# Patient Record
Sex: Female | Born: 1955
Health system: Southern US, Community
[De-identification: ages and names within clinical notes are randomized; demographics above are authoritative.]

## PROBLEM LIST (undated history)

## (undated) DIAGNOSIS — M1990 Unspecified osteoarthritis, unspecified site: Secondary | ICD-10-CM

## (undated) DIAGNOSIS — J302 Other seasonal allergic rhinitis: Secondary | ICD-10-CM

## (undated) DIAGNOSIS — I10 Essential (primary) hypertension: Secondary | ICD-10-CM

## (undated) DIAGNOSIS — G510 Bell's palsy: Secondary | ICD-10-CM

## (undated) DIAGNOSIS — E785 Hyperlipidemia, unspecified: Secondary | ICD-10-CM

## (undated) DIAGNOSIS — G8929 Other chronic pain: Secondary | ICD-10-CM

## (undated) DIAGNOSIS — T7840XA Allergy, unspecified, initial encounter: Secondary | ICD-10-CM

## (undated) DIAGNOSIS — H9201 Otalgia, right ear: Secondary | ICD-10-CM

## (undated) DIAGNOSIS — Z803 Family history of malignant neoplasm of breast: Secondary | ICD-10-CM

## (undated) HISTORY — DX: Unspecified osteoarthritis, unspecified site: M19.90

## (undated) HISTORY — DX: Other chronic pain: G89.29

## (undated) HISTORY — DX: Other seasonal allergic rhinitis: J30.2

## (undated) HISTORY — DX: Hyperlipidemia, unspecified: E78.5

## (undated) HISTORY — DX: Allergy, unspecified, initial encounter: T78.40XA

## (undated) HISTORY — PX: TUBAL LIGATION: SHX77

## (undated) HISTORY — DX: Otalgia, right ear: H92.01

## (undated) HISTORY — DX: Family history of malignant neoplasm of breast: Z80.3

## (undated) HISTORY — DX: Bell's palsy: G51.0

---
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
## 1999-04-20 ENCOUNTER — Emergency Department (HOSPITAL_COMMUNITY): Admission: EM | Admit: 1999-04-20 | Discharge: 1999-04-20 | Payer: Self-pay | Admitting: Emergency Medicine

## 1999-10-06 ENCOUNTER — Emergency Department (HOSPITAL_COMMUNITY): Admission: EM | Admit: 1999-10-06 | Discharge: 1999-10-06 | Payer: Self-pay | Admitting: Emergency Medicine

## 1999-12-20 ENCOUNTER — Other Ambulatory Visit: Admission: RE | Admit: 1999-12-20 | Discharge: 1999-12-20 | Payer: Self-pay | Admitting: Obstetrics and Gynecology

## 2000-03-04 ENCOUNTER — Emergency Department (HOSPITAL_COMMUNITY): Admission: EM | Admit: 2000-03-04 | Discharge: 2000-03-04 | Payer: Self-pay | Admitting: Emergency Medicine

## 2000-12-11 ENCOUNTER — Encounter: Admission: RE | Admit: 2000-12-11 | Discharge: 2000-12-11 | Payer: Self-pay | Admitting: Family Medicine

## 2000-12-11 ENCOUNTER — Encounter: Payer: Self-pay | Admitting: Family Medicine

## 2001-08-13 ENCOUNTER — Emergency Department (HOSPITAL_COMMUNITY): Admission: EM | Admit: 2001-08-13 | Discharge: 2001-08-13 | Payer: Self-pay | Admitting: Emergency Medicine

## 2001-12-11 ENCOUNTER — Other Ambulatory Visit: Admission: RE | Admit: 2001-12-11 | Discharge: 2001-12-11 | Payer: Self-pay | Admitting: Obstetrics and Gynecology

## 2001-12-15 ENCOUNTER — Encounter: Payer: Self-pay | Admitting: Family Medicine

## 2001-12-15 ENCOUNTER — Encounter: Admission: RE | Admit: 2001-12-15 | Discharge: 2001-12-15 | Payer: Self-pay | Admitting: Family Medicine

## 2002-03-21 ENCOUNTER — Emergency Department (HOSPITAL_COMMUNITY): Admission: EM | Admit: 2002-03-21 | Discharge: 2002-03-21 | Payer: Self-pay | Admitting: Emergency Medicine

## 2002-03-21 ENCOUNTER — Encounter: Payer: Self-pay | Admitting: Emergency Medicine

## 2002-07-14 ENCOUNTER — Emergency Department (HOSPITAL_COMMUNITY): Admission: EM | Admit: 2002-07-14 | Discharge: 2002-07-14 | Payer: Self-pay | Admitting: Emergency Medicine

## 2003-01-05 ENCOUNTER — Emergency Department (HOSPITAL_COMMUNITY): Admission: EM | Admit: 2003-01-05 | Discharge: 2003-01-05 | Payer: Self-pay | Admitting: Emergency Medicine

## 2004-06-24 ENCOUNTER — Emergency Department (HOSPITAL_COMMUNITY): Admission: EM | Admit: 2004-06-24 | Discharge: 2004-06-24 | Payer: Self-pay | Admitting: Emergency Medicine

## 2006-01-16 ENCOUNTER — Emergency Department (HOSPITAL_COMMUNITY): Admission: EM | Admit: 2006-01-16 | Discharge: 2006-01-16 | Payer: Self-pay | Admitting: Emergency Medicine

## 2007-08-04 ENCOUNTER — Emergency Department (HOSPITAL_COMMUNITY): Admission: EM | Admit: 2007-08-04 | Discharge: 2007-08-04 | Payer: Self-pay | Admitting: Emergency Medicine

## 2007-08-14 ENCOUNTER — Emergency Department (HOSPITAL_COMMUNITY): Admission: EM | Admit: 2007-08-14 | Discharge: 2007-08-15 | Payer: Self-pay | Admitting: Emergency Medicine

## 2007-08-21 ENCOUNTER — Ambulatory Visit: Payer: Self-pay | Admitting: Internal Medicine

## 2007-08-21 ENCOUNTER — Observation Stay (HOSPITAL_COMMUNITY): Admission: EM | Admit: 2007-08-21 | Discharge: 2007-08-22 | Payer: Self-pay | Admitting: Emergency Medicine

## 2007-09-29 ENCOUNTER — Emergency Department (HOSPITAL_COMMUNITY): Admission: EM | Admit: 2007-09-29 | Discharge: 2007-09-29 | Payer: Self-pay | Admitting: Family Medicine

## 2007-09-30 ENCOUNTER — Ambulatory Visit: Payer: Self-pay | Admitting: *Deleted

## 2007-11-25 ENCOUNTER — Ambulatory Visit: Payer: Self-pay | Admitting: Family Medicine

## 2007-11-25 ENCOUNTER — Encounter (INDEPENDENT_AMBULATORY_CARE_PROVIDER_SITE_OTHER): Payer: Self-pay | Admitting: Nurse Practitioner

## 2007-11-25 LAB — CONVERTED CEMR LAB
ALT: <8 units/L (ref 0–35)
AST: 15 units/L (ref 0–37)
Albumin: 4.2 g/dL (ref 3.5–5.2)
Basophils Relative: 0 % (ref 0–1)
Eosinophils Absolute: 0.1 10*3/uL (ref 0.0–0.7)
HCT: 38 % (ref 36.0–46.0)
Lymphocytes Relative: 33 % (ref 12–46)
Lymphs Abs: 1.2 10*3/uL (ref 0.7–4.0)
Monocytes Relative: 9 % (ref 3–12)
Potassium: 4.5 meq/L (ref 3.5–5.3)
RBC: 3.97 M/uL (ref 3.87–5.11)
RDW: 13.2 % (ref 11.5–15.5)
Sodium: 139 meq/L (ref 135–145)
TSH: 1.981 microintl units/mL (ref 0.350–5.50)
Total Bilirubin: 0.3 mg/dL (ref 0.3–1.2)
WBC: 3.8 10*3/uL — ABNORMAL LOW (ref 4.0–10.5)

## 2008-01-06 ENCOUNTER — Emergency Department (HOSPITAL_COMMUNITY): Admission: EM | Admit: 2008-01-06 | Discharge: 2008-01-06 | Payer: Self-pay | Admitting: Emergency Medicine

## 2008-01-27 ENCOUNTER — Encounter (INDEPENDENT_AMBULATORY_CARE_PROVIDER_SITE_OTHER): Payer: Self-pay | Admitting: Nurse Practitioner

## 2008-01-27 ENCOUNTER — Ambulatory Visit: Payer: Self-pay | Admitting: Internal Medicine

## 2008-01-27 LAB — CONVERTED CEMR LAB
Chlamydia, DNA Probe: NEGATIVE
HDL: 64 mg/dL (ref >39)
LDL Cholesterol: 158 mg/dL — ABNORMAL HIGH (ref 0–99)
Total CHOL/HDL Ratio: 3.9
Triglycerides: 131 mg/dL (ref <150)
VLDL: 26 mg/dL (ref 0–40)

## 2008-02-03 ENCOUNTER — Ambulatory Visit (HOSPITAL_COMMUNITY): Admission: RE | Admit: 2008-02-03 | Discharge: 2008-02-03 | Payer: Self-pay | Admitting: Family Medicine

## 2008-02-07 ENCOUNTER — Emergency Department (HOSPITAL_COMMUNITY): Admission: EM | Admit: 2008-02-07 | Discharge: 2008-02-07 | Payer: Self-pay | Admitting: Emergency Medicine

## 2008-02-11 ENCOUNTER — Ambulatory Visit: Payer: Self-pay | Admitting: Internal Medicine

## 2008-06-07 ENCOUNTER — Ambulatory Visit: Payer: Self-pay | Admitting: Internal Medicine

## 2008-09-08 ENCOUNTER — Ambulatory Visit: Payer: Self-pay | Admitting: Internal Medicine

## 2008-09-15 ENCOUNTER — Emergency Department (HOSPITAL_COMMUNITY): Admission: EM | Admit: 2008-09-15 | Discharge: 2008-09-15 | Payer: Self-pay | Admitting: Family Medicine

## 2008-10-06 ENCOUNTER — Encounter (INDEPENDENT_AMBULATORY_CARE_PROVIDER_SITE_OTHER): Payer: Self-pay | Admitting: Internal Medicine

## 2008-10-06 ENCOUNTER — Ambulatory Visit: Payer: Self-pay | Admitting: Internal Medicine

## 2008-10-06 LAB — CONVERTED CEMR LAB
ALT: <8 units/L (ref 0–35)
Bilirubin, Direct: <0.1 mg/dL (ref 0.0–0.3)
HDL: 63 mg/dL (ref >39)
LDL Cholesterol: 78 mg/dL (ref 0–99)
Total Protein: 7.4 g/dL (ref 6.0–8.3)

## 2009-05-02 ENCOUNTER — Ambulatory Visit: Payer: Self-pay | Admitting: Internal Medicine

## 2009-05-02 ENCOUNTER — Encounter (INDEPENDENT_AMBULATORY_CARE_PROVIDER_SITE_OTHER): Payer: Self-pay | Admitting: Internal Medicine

## 2009-05-02 LAB — CONVERTED CEMR LAB
BUN: 20 mg/dL (ref 6–23)
Calcium: 9.3 mg/dL (ref 8.4–10.5)
Chloride: 103 meq/L (ref 96–112)
Creatinine, Ser: 0.73 mg/dL (ref 0.40–1.20)
Microalb, Ur: 1.15 mg/dL (ref 0.00–1.89)
Potassium: 4 meq/L (ref 3.5–5.3)
Sodium: 139 meq/L (ref 135–145)

## 2009-07-19 ENCOUNTER — Emergency Department (HOSPITAL_COMMUNITY): Admission: EM | Admit: 2009-07-19 | Discharge: 2009-07-19 | Payer: Self-pay | Admitting: Emergency Medicine

## 2009-08-01 ENCOUNTER — Ambulatory Visit: Payer: Self-pay | Admitting: Internal Medicine

## 2009-08-02 ENCOUNTER — Encounter (INDEPENDENT_AMBULATORY_CARE_PROVIDER_SITE_OTHER): Payer: Self-pay | Admitting: Internal Medicine

## 2009-08-11 ENCOUNTER — Ambulatory Visit (HOSPITAL_COMMUNITY): Admission: RE | Admit: 2009-08-11 | Discharge: 2009-08-11 | Payer: Self-pay | Admitting: Internal Medicine

## 2009-08-15 ENCOUNTER — Emergency Department (HOSPITAL_COMMUNITY): Admission: EM | Admit: 2009-08-15 | Discharge: 2009-08-15 | Payer: Self-pay | Admitting: Emergency Medicine

## 2010-06-17 ENCOUNTER — Emergency Department (HOSPITAL_COMMUNITY): Admission: EM | Admit: 2010-06-17 | Discharge: 2010-06-17 | Payer: Self-pay | Admitting: Family Medicine

## 2010-07-04 ENCOUNTER — Emergency Department (HOSPITAL_COMMUNITY): Admission: EM | Admit: 2010-07-04 | Discharge: 2010-07-04 | Payer: Self-pay | Admitting: Family Medicine

## 2010-08-10 ENCOUNTER — Encounter (INDEPENDENT_AMBULATORY_CARE_PROVIDER_SITE_OTHER): Payer: Self-pay | Admitting: Family Medicine

## 2010-08-10 LAB — CONVERTED CEMR LAB
ALT: 11 units/L (ref 0–35)
Albumin: 4.3 g/dL (ref 3.5–5.2)
BUN: 16 mg/dL (ref 6–23)
Chloride: 108 meq/L (ref 96–112)
Cholesterol: 186 mg/dL (ref 0–200)
Creatinine, Ser: 0.71 mg/dL (ref 0.40–1.20)
HDL: 79 mg/dL (ref >39)
LDL Cholesterol: 87 mg/dL (ref 0–99)
Sodium: 143 meq/L (ref 135–145)
Total CHOL/HDL Ratio: 2.4
Triglycerides: 100 mg/dL (ref <150)

## 2011-01-05 NOTE — Discharge Summary (Signed)
NAMEJEMMA, Kaylee [name]               ACCOUNT NO.:  [id number]   MEDICAL RECORD NO.:  [id number]          PATIENT TYPE:  OBV   LOCATION:  3702                         FACILITY:  [facility]   PHYSICIAN:  Joaquin Courts, MD     DATE OF BIRTH:  [birth year]   DATE OF ADMISSION:  08/21/2007  DATE OF DISCHARGE:  08/22/2007                               DISCHARGE SUMMARY   DISCHARGE DIAGNOSES:  1. Bell's palsy.  2. Neuropathic pain.  3. Elevated blood pressure.  4. Seasonal allergies.   DISCHARGE MEDICATIONS:  1. Acyclovir 800 mg 1 tab 5 times a day for 3 more days.  2. Amitriptyline 25 mg q.h.s.   FOLLOW UP:  1. The patient is to follow up with [facility] on August 25, 2007 at 8 a.m. for evaluation of new onset of Bell's palsy.  The      patient had an ANA that was positive and will need this followed up      as an outpatient as this was not readily available at the time of      discharge.  2. The patient is also to follow up with Dr. Bruna Potter in 2 weeks for      evaluation of elevated blood pressures.   PROCEDURE:  No procedures were performed during this hospitalization.   CONSULTATIONS:  No consultations were obtained during this  hospitalization.   ADMITTING HISTORY AND PHYSICAL:  Kaylee [name] is a 55 year old female  presenting with no known significant medical problems that woke up  approximately a week prior to admission with right-sided headache and  right-sided facial droop.  The patient was evaluated in the emergency  room and given a course of prednisone and acyclovir which she has been  taking as an outpatient.  The night prior to admission, her headache got  significantly worse.  It was not associated with any blurry vision,  dizziness, nausea, vomiting, fevers or chills.  She does note that she  has had blurry vision, some dizziness, right arm numbness and tingling,  and decreased taste sensation since her right facial droop started  approximately a week ago.   Since her headache got significantly worse,  she decided to come to the emergency room to get evaluated.   PHYSICAL EXAMINATION:  VITAL SIGNS:  Temperature 98.3, blood pressure  181/104, pulse 88, respiratory rate 20, O2 sat 98% on room air.  GENERAL:  She was awake, dry heaving.  Her speech was slowed.  HEENT:  Eyes:  Pupils were pinpoint.  Note that this exam was done after  she received a total of 4 mg of Dilaudid.  Pupils were reactive.  She  had a lateral nystagmus bilaterally and was nonicteric.  Her external  auditory canal was not completely visualized secondary to cerumen  impaction.  NECK:  Negative Kernig and Brudzinski signs.  No lymphadenopathy.  RESPIRATORY:  Scattered rhonchi with a normal respiratory effort.  CARDIOVASCULAR:  Regular rate and rhythm.  No murmurs, rubs or gallops.  ABDOMEN:  Good bowel sounds, soft, nontender and nondistended.  EXTREMITIES:  No edema.  SKIN:  No rashes.  NEURO:  She had a right facial droop with a loss of the nasolabial fold  on the right.  This right facial droop included the forehead.  Her  extraocular motions were intact.  Lateral nystagmus was noted.  She had  no other cranial nerve deficits other than the ones mentioned above.  Her strength was 5/5 in all of her extremities.  She had no cerebellar  signs.   LABORATORY DATA:  Admitting labs:  Sodium 142, potassium 3.8, chloride  109, bicarb 27, BUN 15, creatinine 0.63, glucose 91, bilirubin 0.4,  alkaline phosphatase 60, AST 18, ALT 10, total protein 6.0, albumin 3.7,  calcium 9.4, white blood cell count 4.4, absolute neutrophil count 2.1,  differential on that was all within normal limits, sed rate 10, HIV  nonreactive, TSH 1.052, ANA positive.  Discharge labs:  ANA positive,  sodium 143, potassium 3.4, chloride 104, bicarb 27, BUN 6, creatinine  0.60, glucose 93, calcium 8.9.  CBC:  White blood cell count 4.8,  hemoglobin 11.3, platelet count 280, HIV nonreactive, TSH 1.052.    DIAGNOSTICS:  MRI/MRA of the head was done.  The MRI was unremarkable.  The MRA showed a high-grade left vertebral ostial stenosis without other  significant abnormality.  The high-grade ostial stenosis of the left  vertebral was estimated to be 75-90%.   HOSPITAL COURSE:  1. Bell's palsy.  The patient was to continue her acyclovir to finish      the course.  MRI/MRA of the brain was done secondary to the      nystagmus and right arm symptoms.  It was felt like the      differential should include a mass, aneurysm or cerebrovascular      accident.  The MRI/MRA of the brain was not indicative of any      intracranial pathology.  The patient is to follow up with neurology      on August 25, 2007.  An ANA was also checked and was not back at      the time of the patient's discharge, and this will need to be      followed up since it was positive.  2. Neuropathic pain.  The patient described her headache as kind of      like a toothache with some tingling.  Since it was in a      dermatomal distribution, it was felt like this was likely      neuropathic in nature and the patient was started on amitriptyline      at a low dose.  3. Elevated blood pressure.  The patient's blood pressure was elevated      with systolics ranging from 130-180s and diastolics ranging from 80-      100s during this hospitalization.  She will need to have this      followed up as an outpatient with her primary care physician.   Discharge vital signs:  Temperature 98.1, blood pressure 128/80, pulse  91, respiratory rate 18, O2 sat 96% on room air.      Joaquin Courts, MD  Electronically Signed     VW/MEDQ  D:  08/23/2007  T:  08/23/2007  Job:  [id number]

## 2011-05-09 LAB — HIV ANTIBODY (ROUTINE TESTING W REFLEX): HIV: NONREACTIVE

## 2011-05-09 LAB — CBC
Hemoglobin: 11.3 — ABNORMAL LOW
Hemoglobin: 12.8
MCV: 90.6
Platelets: 280
Platelets: 295
RBC: 4.25
RDW: 13.3
WBC: 4.4

## 2011-05-09 LAB — DIFFERENTIAL
Basophils Absolute: 0
Basophils Relative: 0
Eosinophils Absolute: 0
Eosinophils Relative: 1
Eosinophils Relative: 1
Lymphocytes Relative: 34
Lymphocytes Relative: 45
Lymphs Abs: 1.6
Lymphs Abs: 2
Monocytes Absolute: 0.3
Monocytes Absolute: 0.3
Monocytes Relative: 7
Neutro Abs: 2.8
Neutrophils Relative %: 58

## 2011-05-09 LAB — URINALYSIS, ROUTINE W REFLEX MICROSCOPIC
Bilirubin Urine: NEGATIVE
Glucose, UA: NEGATIVE
Ketones, ur: NEGATIVE
Protein, ur: NEGATIVE
Urobilinogen, UA: 0.2

## 2011-05-09 LAB — COMPREHENSIVE METABOLIC PANEL
Alkaline Phosphatase: 60
BUN: 10
GFR calc non Af Amer: >60
Potassium: 3.8
Total Bilirubin: 0.4
Total Protein: 6.8

## 2011-05-09 LAB — BASIC METABOLIC PANEL
Calcium: 8.9
Creatinine, Ser: 0.6
GFR calc Af Amer: >60
GFR calc non Af Amer: >60
Glucose, Bld: 93
Sodium: 143

## 2011-05-09 LAB — ANGIOTENSIN CONVERTING ENZYME: Angiotensin-Converting Enzyme: 27 U/L (ref 9–67)

## 2011-05-09 LAB — PREGNANCY, URINE: Preg Test, Ur: NEGATIVE

## 2011-05-09 LAB — ANA: Anti Nuclear Antibody(ANA): POSITIVE — AB

## 2011-05-09 LAB — CREATININE, URINE, RANDOM: Creatinine, Urine: 55.7

## 2011-05-09 LAB — ANTI-NUCLEAR AB-TITER (ANA TITER): ANA Titer 1: 1:40 {titer} — ABNORMAL HIGH

## 2011-05-09 LAB — TSH: TSH: 1.052

## 2011-05-09 LAB — MICROALBUMIN, URINE: Microalb, Ur: 0.84

## 2011-05-16 LAB — WET PREP, GENITAL: Yeast Wet Prep HPF POC: NONE SEEN

## 2011-05-16 LAB — POCT PREGNANCY, URINE
Operator id: 282151
Preg Test, Ur: NEGATIVE

## 2011-05-25 LAB — POCT CARDIAC MARKERS
CKMB, poc: <1 — ABNORMAL LOW
Myoglobin, poc: 33.4
Troponin i, poc: <0.05

## 2011-05-25 LAB — I-STAT 8, (EC8 V) (CONVERTED LAB)
Acid-Base Excess: 2
Bicarbonate: 26.6 — ABNORMAL HIGH
HCT: 43
Operator id: 272551
pCO2, Ven: 39.6 — ABNORMAL LOW
pH, Ven: 7.435 — ABNORMAL HIGH

## 2011-05-25 LAB — POCT I-STAT CREATININE
Creatinine, Ser: 0.9
Operator id: 272551

## 2011-10-27 ENCOUNTER — Emergency Department (HOSPITAL_COMMUNITY)
Admission: EM | Admit: 2011-10-27 | Discharge: 2011-10-27 | Disposition: A | Discharge status: Home / Self Care | Payer: Self-pay | Source: Home / Self Care | Attending: Emergency Medicine | Admitting: Emergency Medicine

## 2011-10-27 ENCOUNTER — Encounter (HOSPITAL_COMMUNITY): Payer: Self-pay | Admitting: Emergency Medicine

## 2011-10-27 DIAGNOSIS — B3731 Acute candidiasis of vulva and vagina: Secondary | ICD-10-CM

## 2011-10-27 DIAGNOSIS — B373 Candidiasis of vulva and vagina: Secondary | ICD-10-CM

## 2011-10-27 DIAGNOSIS — M779 Enthesopathy, unspecified: Secondary | ICD-10-CM

## 2011-10-27 HISTORY — DX: Essential (primary) hypertension: I10

## 2011-10-27 LAB — POCT URINALYSIS DIP (DEVICE)
Nitrite: NEGATIVE
Specific Gravity, Urine: >=1.03 (ref 1.005–1.030)
Urobilinogen, UA: 0.2 mg/dL (ref 0.0–1.0)
pH: 5.5 (ref 5.0–8.0)

## 2011-10-27 LAB — WET PREP, GENITAL
Clue Cells Wet Prep HPF POC: NONE SEEN
Trich, Wet Prep: NONE SEEN

## 2011-10-27 MED ORDER — TERCONAZOLE 0.4 % VA CREA: 1.0000 | TOPICAL_CREAM | Freq: Every day | VAGINAL | Status: AC

## 2011-10-27 MED ORDER — MELOXICAM 15 MG PO TABS: 15.0000 mg | ORAL_TABLET | Freq: Every day | ORAL | Status: DC

## 2011-10-27 NOTE — Discharge Instructions (Signed)
Candida Infection, Adult A candida infection (also called yeast, fungus and Monilia infection) is an overgrowth of yeast that can occur anywhere on the body. A yeast infection commonly occurs in warm, moist body areas. Usually, the infection remains localized but can spread to become a systemic infection. A yeast infection may be a sign of a more severe disease such as diabetes, leukemia, or AIDS. A yeast infection can occur in both men and women. In women, Candida vaginitis is a vaginal infection. It is one of the most common causes of vaginitis. Men usually do not have symptoms or know they have an infection until other problems develop. Men may find out they have a yeast infection because their sex partner has a yeast infection. Uncircumcised men are more likely to get a yeast infection than circumcised men. This is because the uncircumcised glans is not exposed to air and does not remain as dry as that of a circumcised glans. Older adults may develop yeast infections around dentures. CAUSES  Women  Antibiotics.   Steroid medication taken for a long time.   Being overweight (obese).   Diabetes.   Poor immune condition.   Certain serious medical conditions.   Immune suppressive medications for organ transplant patients.   Chemotherapy.   Pregnancy.   Menstration.   Stress and fatigue.   Intravenous drug use.   Oral contraceptives.   Wearing tight-fitting clothes in the crotch area.   Catching it from a sex partner who has a yeast infection.   Spermicide.   Intravenous, urinary, or other catheters.  Men  Catching it from a sex partner who has a yeast infection.   Having oral or anal sex with a person who has the infection.   Spermicide.   Diabetes.   Antibiotics.   Poor immune system.   Medications that suppress the immune system.   Intravenous drug use.   Intravenous, urinary, or other catheters.  SYMPTOMS  Women  Thick, white vaginal discharge.    Vaginal itching.   Redness and swelling in and around the vagina.   Irritation of the lips of the vagina and perineum.   Blisters on the vaginal lips and perineum.   Painful sexual intercourse.   Low blood sugar (hypoglycemia).   Painful urination.   Bladder infections.   Intestinal problems such as constipation, indigestion, bad breath, bloating, increase in gas, diarrhea, or loose stools.  Men  Men may develop intestinal problems such as constipation, indigestion, bad breath, bloating, increase in gas, diarrhea, or loose stools.   Dry, cracked skin on the penis with itching or discomfort.   Jock itch.   Dry, flaky skin.   Athlete's foot.   Hypoglycemia.  DIAGNOSIS  Women  A history and an exam are performed.   The discharge may be examined under a microscope.   A culture may be taken of the discharge.  Men  A history and an exam are performed.   Any discharge from the penis or areas of cracked skin will be looked at under the microscope and cultured.   Stool samples may be cultured.  TREATMENT  Women  Vaginal antifungal suppositories and creams.   Medicated creams to decrease irritation and itching on the outside of the vagina.   Warm compresses to the perineal area to decrease swelling and discomfort.   Oral antifungal medications.   Medicated vaginal suppositories or cream for repeated or recurrent infections.   Wash and dry the irritation areas before applying the cream.     Eating yogurt with lactobacillus may help with prevention and treatment.   Sometimes painting the vagina with gentian violet solution may help if creams and suppositories do not work.  Men  Antifungal creams and oral antifungal medications.   Sometimes treatment must continue for 30 days after the symptoms go away to prevent recurrence.  HOME CARE INSTRUCTIONS  Women  Use cotton underwear and avoid tight-fitting clothing.   Avoid colored, scented toilet paper and  deodorant tampons or pads.   Do not douche.   Keep your diabetes under control.   Finish all the prescribed medications.   Keep your skin clean and dry.   Consume milk or yogurt with lactobacillus active culture regularly. If you get frequent yeast infections and think that is what the infection is, there are over-the-counter medications that you can get. If the infection does not show healing in 3 days, talk to your caregiver.   Tell your sex partner you have a yeast infection. Your partner may need treatment also, especially if your infection does not clear up or recurs.  Men  Keep your skin clean and dry.   Keep your diabetes under control.   Finish all prescribed medications.   Tell your sex partner that you have a yeast infection so they can be treated if necessary.  SEEK MEDICAL CARE IF:   Your symptoms do not clear up or worsen in one week after treatment.   You have an oral temperature above 102 F (38.9 C).   You have trouble swallowing or eating for a prolonged time.   You develop blisters on and around your vagina.   You develop vaginal bleeding and it is not your menstrual period.   You develop abdominal pain.   You develop intestinal problems as mentioned above.   You get weak or lightheaded.   You have painful or increased urination.   You have pain during sexual intercourse.  MAKE SURE YOU:   Understand these instructions.   Will watch your condition.   Will get help right away if you are not doing well or get worse.  Document Released: 09/13/2004 Document Revised: 07/26/2011 Document Reviewed: 12/26/2009 [facility] Patient Information 2012 [place], [place].Tendinitis Tendinitis is swelling and inflammation of the tendons. Tendons are band-like tissues that connect muscle to bone. Tendinitis commonly occurs in the:   Shoulders (rotator cuff).   Heels (Achilles tendon).   Elbows (triceps tendon).  CAUSES Tendinitis is usually caused by overusing  the tendon, muscles, and joints involved. When the tissue surrounding a tendon (synovium) becomes inflamed, it is called tenosynovitis. Tendinitis commonly develops in people whose jobs require repetitive motions. SYMPTOMS  Pain.   Tenderness.   Mild swelling.  DIAGNOSIS Tendinitis is usually diagnosed by physical exam. Your caregiver may also order X-rays or other imaging tests. TREATMENT Your caregiver may recommend certain medicines or exercises for your treatment. HOME CARE INSTRUCTIONS   Use a sling or splint for as long as directed by your caregiver until the pain decreases.   Put ice on the injured area.   Put ice in a plastic bag.   Place a towel between your skin and the bag.   Leave the ice on for 15 to 20 minutes, 3 to 4 times a day.   Avoid using the limb while the tendon is painful. Perform gentle range of motion exercises only as directed by your caregiver. Stop exercises if pain or discomfort increase, unless directed otherwise by your caregiver.   Only take over-the-counter or  prescription medicines for pain, discomfort, or fever as directed by your caregiver.  SEEK MEDICAL CARE IF:   Your pain and swelling increase.   You develop new, unexplained symptoms, especially increased numbness in the hands.  MAKE SURE YOU:   Understand these instructions.   Will watch your condition.   Will get help right away if you are not doing well or get worse.  Document Released: 08/03/2000 Document Revised: 07/26/2011 Document Reviewed: 10/23/2010 [facility] Patient Information 2012 [place], [place].

## 2011-10-27 NOTE — ED Notes (Signed)
Pt. Complains of vaginal irritating, redness, itching, and burning upon urination starting last Sunday. Using vagisil with no relief. Pt complains of pain in left hand, suspects arthritis. Using Tylenol with no relief.

## 2011-10-27 NOTE — ED Provider Notes (Signed)
Chief Complaint  Patient presents with  . Vaginal Itching  . Vaginal Discharge    History of Present Illness:   Kaylee [name] is a 56 year old female who presents with a one-week history of vaginal and vulvar itching. She has slight discharge and odor. There is erythema of the external genitalia but no rash. She had some pelvic pain about 2 days ago but now is gone away. She denies any lower back pain. She does have some burning of the vulva with urination but no frequency, urgency, or hematuria. She denies any fever, chills, nausea, or vomiting. Her last menstrual period was 4 years ago.  She also complains of a two-week history of pain in both of her hands left more so than right. This is localized over the flexor tendons the second through fifth fingers on the left and the middle finger on the right. She feels swelling and tenderness localized over the MCP joint area. She denies any other joint pain. A sister has rheumatoid arthritis.  Review of Systems:  Other than noted above, the patient denies any of the following symptoms: Systemic:  No fever, chills, sweats, fatigue, or weight loss. GI:  No abdominal pain, nausea, anorexia, vomiting, diarrhea, constipation, melena or hematochezia. GU:  No dysuria, frequency, urgency, hematuria, vaginal discharge, itching, or abnormal vaginal bleeding. Skin:  No rash or itching.   PMFSH:  Past medical history, family history, social history, meds, and allergies were reviewed.  Physical Exam:   Vital signs:  BP 150/96  Pulse 67  Temp 97.9 F (36.6 C)  Resp 18  SpO2 100% General:  Alert, oriented and in no distress. Lungs:  Breath sounds clear and equal bilaterally.  No wheezes, rales or rhonchi. Heart:  Regular rhythm.  No gallops or murmers. Abdomen:  Soft, flat and non-distended.  No organomegaly or mass.  No tenderness, guarding or rebound.  Bowel sounds normally active. Pelvic exam:  There is erythema of the labia minora. No ulcerations or lesions  were noted. Vaginal mucosa was normal. There was a small amount of mucoid discharge. Cervix appeared normal. No cervical motion tenderness. Uterus normal in size and nontender. No adnexal masses or tenderness. Skin:  Clear, warm and dry.  Labs:   Results for orders placed during the hospital encounter of 10/27/11  POCT URINALYSIS DIP (DEVICE)      Component Value Range   Glucose, UA NEGATIVE  NEGATIVE (mg/dL)   Bilirubin Urine SMALL (*) NEGATIVE    Ketones, ur NEGATIVE  NEGATIVE (mg/dL)   Specific Gravity, Urine >=1.030  1.005 - 1.030    Hgb urine dipstick TRACE (*) NEGATIVE    pH 5.5  5.0 - 8.0    Protein, ur 30 (*) NEGATIVE (mg/dL)   Urobilinogen, UA 0.2  0.0 - 1.0 (mg/dL)   Nitrite NEGATIVE  NEGATIVE    Leukocytes, UA SMALL (*) NEGATIVE   WET PREP, GENITAL      Component Value Range   Yeast Wet Prep HPF POC RARE (*) NONE SEEN    Trich, Wet Prep NONE SEEN  NONE SEEN    Clue Cells Wet Prep HPF POC NONE SEEN  NONE SEEN    WBC, Wet Prep HPF POC MANY (*) NONE SEEN      Assessment:   Diagnoses that have been ruled out:  None  Diagnoses that are still under consideration:  None  Final diagnoses:  Candida vaginitis  Tendonitis    Plan:   1.  The following meds were prescribed:   New  Prescriptions   MELOXICAM (MOBIC) 15 MG TABLET    Take 1 tablet (15 mg total) by mouth daily.   TERCONAZOLE (TERAZOL 7) 0.4 % VAGINAL CREAM    Place 1 applicator vaginally at bedtime.   2.  The patient was instructed in symptomatic care and handouts were given. 3.  The patient was told to return if becoming worse in any way, if no better in 3 or 4 days, and given some red flag symptoms that would indicate earlier return.    Reuben Likes, MD 10/27/11 [phone]

## 2011-10-29 LAB — GC/CHLAMYDIA PROBE AMP, GENITAL
Chlamydia, DNA Probe: NEGATIVE
GC Probe Amp, Genital: NEGATIVE

## 2011-10-29 NOTE — ED Notes (Signed)
GC/chlamydia neg., Wet prep: rare yeast, many WBC's. Pt. Adequately treated with Terazol cream. Vassie Moselle 10/29/2011

## 2012-07-27 ENCOUNTER — Emergency Department (HOSPITAL_COMMUNITY)
Admission: EM | Admit: 2012-07-27 | Discharge: 2012-07-28 | Disposition: A | Discharge status: Home / Self Care | Payer: Self-pay | Attending: Emergency Medicine | Admitting: Emergency Medicine

## 2012-07-27 ENCOUNTER — Encounter (HOSPITAL_COMMUNITY): Payer: Self-pay | Admitting: *Deleted

## 2012-07-27 ENCOUNTER — Emergency Department (HOSPITAL_COMMUNITY): Payer: Self-pay

## 2012-07-27 DIAGNOSIS — Z79899 Other long term (current) drug therapy: Secondary | ICD-10-CM | POA: Insufficient documentation

## 2012-07-27 DIAGNOSIS — K529 Noninfective gastroenteritis and colitis, unspecified: Secondary | ICD-10-CM

## 2012-07-27 DIAGNOSIS — R197 Diarrhea, unspecified: Secondary | ICD-10-CM | POA: Insufficient documentation

## 2012-07-27 DIAGNOSIS — I10 Essential (primary) hypertension: Secondary | ICD-10-CM | POA: Insufficient documentation

## 2012-07-27 DIAGNOSIS — R195 Other fecal abnormalities: Secondary | ICD-10-CM

## 2012-07-27 DIAGNOSIS — K2900 Acute gastritis without bleeding: Secondary | ICD-10-CM | POA: Insufficient documentation

## 2012-07-27 DIAGNOSIS — K921 Melena: Secondary | ICD-10-CM | POA: Insufficient documentation

## 2012-07-27 DIAGNOSIS — Z87891 Personal history of nicotine dependence: Secondary | ICD-10-CM | POA: Insufficient documentation

## 2012-07-27 LAB — CBC WITH DIFFERENTIAL/PLATELET
Basophils Absolute: 0 10*3/uL (ref 0.0–0.1)
Basophils Relative: 0 % (ref 0–1)
HCT: 40.4 % (ref 36.0–46.0)
Hemoglobin: 13.2 g/dL (ref 12.0–15.0)
Lymphocytes Relative: 10 % — ABNORMAL LOW (ref 12–46)
MCHC: 32.7 g/dL (ref 30.0–36.0)
Monocytes Absolute: 0.1 10*3/uL (ref 0.1–1.0)
Neutro Abs: 6.4 10*3/uL (ref 1.7–7.7)
Neutrophils Relative %: 89 % — ABNORMAL HIGH (ref 43–77)
RDW: 13.2 % (ref 11.5–15.5)
WBC: 7.2 10*3/uL (ref 4.0–10.5)

## 2012-07-27 LAB — COMPREHENSIVE METABOLIC PANEL
ALT: 9 U/L (ref 0–35)
AST: 22 U/L (ref 0–37)
Albumin: 4.4 g/dL (ref 3.5–5.2)
Alkaline Phosphatase: 80 U/L (ref 39–117)
GFR calc Af Amer: >90 mL/min (ref >90)
Glucose, Bld: 141 mg/dL — ABNORMAL HIGH (ref 70–99)
Potassium: 3.7 mEq/L (ref 3.5–5.1)
Sodium: 144 mEq/L (ref 135–145)
Total Protein: 8.3 g/dL (ref 6.0–8.3)

## 2012-07-27 MED ORDER — HYDROCODONE-ACETAMINOPHEN 5-325 MG PO TABS: 1.0000 | ORAL_TABLET | ORAL | Status: DC | PRN

## 2012-07-27 MED ORDER — HYDROMORPHONE HCL PF 1 MG/ML IJ SOLN
1.0000 mg | Freq: Once | INTRAMUSCULAR | Status: AC
  Administered 2012-07-27: 1 mg via INTRAVENOUS
  Filled 2012-07-27: qty 1

## 2012-07-27 MED ORDER — SODIUM CHLORIDE 0.9 % IV BOLUS (SEPSIS)
1000.0000 mL | Freq: Once | INTRAVENOUS | Status: AC
  Administered 2012-07-27: 1000 mL via INTRAVENOUS

## 2012-07-27 MED ORDER — PROMETHAZINE HCL 25 MG RE SUPP: 25.0000 mg | Freq: Four times a day (QID) | RECTAL | Status: DC | PRN

## 2012-07-27 MED ORDER — LORAZEPAM 2 MG/ML IJ SOLN
1.0000 mg | Freq: Once | INTRAMUSCULAR | Status: AC
  Administered 2012-07-27: 1 mg via INTRAVENOUS
  Filled 2012-07-27: qty 1

## 2012-07-27 MED ORDER — ONDANSETRON HCL 4 MG PO TABS: 4.0000 mg | ORAL_TABLET | Freq: Four times a day (QID) | ORAL | Status: DC

## 2012-07-27 MED ORDER — ONDANSETRON HCL 4 MG/2ML IJ SOLN
4.0000 mg | Freq: Once | INTRAMUSCULAR | Status: AC
Start: 2012-07-27 — End: 2012-07-27
  Administered 2012-07-27: 4 mg via INTRAVENOUS
  Filled 2012-07-27: qty 2

## 2012-07-27 NOTE — ED Provider Notes (Signed)
History     CSN: [id number]  Arrival date & time 07/27/12  1801   First MD Initiated Contact with Patient 07/27/12 1847      Chief Complaint  Patient presents with  . Emesis  . Diarrhea    (Consider location/radiation/quality/duration/timing/severity/associated sxs/prior treatment) HPI 56 year old female presents to emergency department with chief complaint of abdominal pain nausea vomiting and diarrhea.  Onset was 6 AM this morning and was sudden.  She has had multiple episodes of watery diarrhea and vomiting. States that her watery diarrhea became bloody and she passed several bloody bowel movements.  She is unsure if she has a history of hemorrhoids. She denies recent travel, ingestion of suspect foods, or known contacts with similar symptoms. She has had chills.  Denies fevers,myalgias, arthralgias. Denies DOE, SOB, chest tightness or pressure, radiation to left arm, jaw or back, or diaphoresis. Denies dysuria, flank pain, suprapubic pain, frequency, urgency, or hematuria. Denies headaches, light headedness, weakness, visual disturbances.   Past Medical History  Diagnosis Date  . Hypertension     Past Surgical History  Procedure Date  . Tubal ligation     History reviewed. No pertinent family history.  History  Substance Use Topics  . Smoking status: Former Smoker    Types: Cigarettes  . Smokeless tobacco: Not on file  . Alcohol Use: Yes     Comment: socially     OB History    Grav Para Term Preterm Abortions TAB SAB Ect Mult Living                  Review of Systems Ten systems are reviewed and are negative for acute change except as noted in the HPI  Allergies  Codeine  Home Medications   Current Outpatient Rx  Name  Route  Sig  Dispense  Refill  . ATORVASTATIN CALCIUM 20 MG PO TABS   Oral   Take 20 mg by mouth daily.         Marland Kitchen HYDROCHLOROTHIAZIDE 25 MG PO TABS   Oral   Take 25 mg by mouth daily.           BP 155/98  Pulse 83  Temp 97.4  F (36.3 C) (Oral)  Resp 26  SpO2 100%  Physical Exam Physical Exam  Nursing note and vitals reviewed. Constitutional: She is oriented to person, place, and time. She appears well-developed and well-nourished.  Patient appears distressed and is actively vomiting and retching during exam. HENT:  Head: Normocephalic and atraumatic.  Eyes: Conjunctivae normal and EOM are normal. Pupils are equal, round, and reactive to light. No scleral icterus.  Neck: Normal range of motion.  Cardiovascular: Normal rate, regular rhythm and normal heart sounds.  Exam reveals no gallop and no friction rub.   No murmur heard. Pulmonary/Chest: Effort normal and breath sounds normal. No respiratory distress.  Abdominal: Soft. Bowel sounds are normal. She exhibits no distension and no mass.  Diffuse abdominal tenderness.  No CVA angle tenderness  Neurological: She is alert and oriented to person, place, and time.  Skin: Skin is warm and dry. She is not diaphoretic.  sticky mucous membrane  Digital Rectal Exam reveals sphincter with good tone. No external hemorrhoids.Patient has palpable internal hemorrhoids. No masses or fissures. Stool color is brown with no overt blood.   ED Course  Procedures (including critical care time)  Labs Reviewed  CBC WITH DIFFERENTIAL - Abnormal; Notable for the following:    Neutrophils Relative 89 (*)  Lymphocytes Relative 10 (*)     Monocytes Relative 1 (*)     All other components within normal limits  COMPREHENSIVE METABOLIC PANEL - Abnormal; Notable for the following:    Glucose, Bld 141 (*)     Total Bilirubin 0.2 (*)     All other components within normal limits  LIPASE, BLOOD  OCCULT BLOOD, POC DEVICE  URINALYSIS, ROUTINE W REFLEX MICROSCOPIC   Dg Abd Acute W/chest  07/27/2012  *RADIOLOGY REPORT*  Clinical Data: Nausea and vomiting.  Diarrhea and constipation.  ACUTE ABDOMEN SERIES (ABDOMEN 2 VIEW & CHEST 1 VIEW)  Comparison: None.  Findings: Lungs clear.   Cardiopericardial silhouette within normal limits.  Trachea midline.  No airspace disease or effusion. Bowel gas pattern is within normal limits.  No pathologic air fluid levels are identified.   Stool and bowel gas present in the rectosigmoid.  4 mm radiopaque density is present in the left mid abdomen.  This may be external to the patient or contents in the enteric stream.  Potentially this represents vascular calcifications seen on end.  This does not have the typical appearance for renal calculus.  IMPRESSION: Nonobstructive bowel gas pattern.   Original Report Authenticated By: Andreas Newport, M.D.      1. Gastroenteritis   2. Occult blood positive stool       MDM   Filed Vitals:   07/27/12 2015 07/27/12 2030 07/27/12 2100 07/27/12 2115  BP: 120/68 122/73 133/86 140/84  Pulse: 92 94 89 87  Temp:      TempSrc:      Resp:      SpO2: 93% 94% 96% 96%    Patient has had no vomiting and is feeling much better.  I will d/c home with antiemetic and pain control. patient will follow up with GI for rectal bleeding. Discussed reasons to seek immediate care. Patient expresses understanding and agrees with plan.       Arthor Captain, PA-C 07/29/12 1320

## 2012-07-27 NOTE — ED Notes (Signed)
Reports waking up this am with n/v/d. Reports now having blood in stools and having severe lower abd pain.

## 2012-07-27 NOTE — ED Notes (Signed)
Pt states she has had "nothing but blood in her stool" x3 hours.

## 2012-07-27 NOTE — ED Notes (Signed)
Pt transported to xray 

## 2012-07-31 NOTE — ED Provider Notes (Signed)
Medical screening examination/treatment/procedure(s) were performed by non-physician practitioner and as supervising physician I was immediately available for consultation/collaboration.  Stony Stegmann, MD 07/31/12 1509 

## 2012-12-05 ENCOUNTER — Ambulatory Visit: Payer: Self-pay | Admitting: Internal Medicine

## 2012-12-10 ENCOUNTER — Institutional Professional Consult (permissible substitution): Payer: Self-pay | Admitting: Family Medicine

## 2012-12-17 ENCOUNTER — Institutional Professional Consult (permissible substitution): Payer: Self-pay | Admitting: Family Medicine

## 2012-12-29 ENCOUNTER — Encounter: Payer: Self-pay | Admitting: Family Medicine

## 2012-12-29 ENCOUNTER — Ambulatory Visit (INDEPENDENT_AMBULATORY_CARE_PROVIDER_SITE_OTHER): Payer: BC Managed Care – PPO | Admitting: Family Medicine

## 2012-12-29 VITALS — BP 154/94 | HR 80 | Ht 61.0 in | Wt 167.0 lb

## 2012-12-29 DIAGNOSIS — I10 Essential (primary) hypertension: Secondary | ICD-10-CM

## 2012-12-29 DIAGNOSIS — R209 Unspecified disturbances of skin sensation: Secondary | ICD-10-CM

## 2012-12-29 DIAGNOSIS — E7800 Pure hypercholesterolemia, unspecified: Secondary | ICD-10-CM

## 2012-12-29 DIAGNOSIS — R202 Paresthesia of skin: Secondary | ICD-10-CM

## 2012-12-29 DIAGNOSIS — R5381 Other malaise: Secondary | ICD-10-CM

## 2012-12-29 DIAGNOSIS — Z23 Encounter for immunization: Secondary | ICD-10-CM | POA: Insufficient documentation

## 2012-12-29 MED ORDER — LISINOPRIL-HYDROCHLOROTHIAZIDE 10-12.5 MG PO TABS: 1.0000 | ORAL_TABLET | Freq: Every day | ORAL | Status: DC

## 2012-12-29 NOTE — Patient Instructions (Addendum)
Check BP at pharmacy, write down, keep list in purse and bring to next visit.  Please do NOT take any decongestants ("sinus" meds) as these will raise your blood pressure.  Use plain zyrtec OR claritin OR Allegra for allergies.  Use sinus rinse kits or Neti-pot as needed for sinus pain.  Follow low sodium diet (make sure your nuts aren't salted).    Sodium-Controlled Diet Sodium is a mineral. It is found in many foods. Sodium may be found naturally or added during the making of a food. The most common form of sodium is salt, which is made up of sodium and chloride. Reducing your sodium intake involves changing your eating habits. The following guidelines will help you reduce the sodium in your diet:  Stop using the salt shaker.  Use salt sparingly in cooking and baking.  Substitute with sodium-free seasonings and spices.  Do not use a salt substitute (potassium chloride) without your caregiver's permission.  Include a variety of fresh, unprocessed foods in your diet.  Limit the use of processed and convenience foods that are high in sodium. USE THE FOLLOWING FOODS SPARINGLY: Breads/Starches  Commercial bread stuffing, commercial pancake or waffle mixes, coating mixes. Waffles. Croutons. Prepared (boxed or frozen) potato, rice, or noodle mixes that contain salt or sodium. Salted [place] fries or hash browns. Salted popcorn, breads, crackers, chips, or snack foods. Vegetables  Vegetables canned with salt or prepared in cream, butter, or cheese sauces. Sauerkraut. Tomato or vegetable juices canned with salt.  Fresh vegetables are allowed if rinsed thoroughly. Fruit  Fruit is okay to eat. Meat and Meat Substitutes  Salted or smoked meats, such as bacon or Canadian bacon, chipped or corned beef, hot dogs, salt pork, luncheon meats, pastrami, ham, or sausage. Canned or smoked fish, poultry, or meat. Processed cheese or cheese spreads, blue or [place]ef[place] cheese. Battered or frozen fish  products. Prepared spaghetti sauce. Baked beans. Reuben sandwiches. Salted nuts. Caviar. Milk  Limit buttermilk to 1 cup per week. Soups and Combination Foods  Bouillon cubes, canned or dried soups, broth, consomm. Convenience (frozen or packaged) dinners with more than 600 mg sodium. Pot pies, pizza, Asian food, fast food cheeseburgers, and specialty sandwiches. Desserts and Sweets  Regular (salted) desserts, pie, commercial fruit snack pies, commercial snack cakes, canned puddings.  Eat desserts and sweets in moderation. Fats and Oils  Gravy mixes or canned gravy. No more than 1 to 2 tbs of salad dressing. Chip dips.  Eat fats and oils in moderation. Beverages  See those listed under the vegetables and milk groups. Condiments  Ketchup, mustard, meat sauces, salsa, regular (salted) and lite soy sauce or mustard. Dill pickles, olives, meat tenderizer. Prepared horseradish or pickle relish. Dutch-processed cocoa. Baking powder or baking soda used medicinally. Worcestershire sauce. "Light" salt. Salt substitute, unless approved by your caregiver. Document Released: 01/26/2002 Document Revised: 10/29/2011 Document Reviewed: 08/29/2009 [facility] Patient Information 2013 [place], [place].   Temporomandibular Problems  Temporomandibular joint (TMJ) dysfunction means there are problems with the joint between your jaw and your skull. This is a joint lined by cartilage like other joints in your body but also has a small disc in the joint which keeps the bones from rubbing on each other. These joints are like other joints and can get inflamed (sore) from arthritis and other problems. When this joint gets sore, it can cause headaches and pain in the jaw and the face. CAUSES  Usually the arthritic types of problems are caused by soreness in the  joint. Soreness in the joint can also be caused by overuse. This may come from grinding your teeth. It may also come from mis-alignment in the  joint. DIAGNOSIS Diagnosis of this condition can often be made by history and exam. Sometimes your caregiver may need X-rays or an MRI scan to determine the exact cause. It may be necessary to see your dentist to determine if your teeth and jaws are lined up correctly. TREATMENT  Most of the time this problem is not serious; however, sometimes it can persist (become chronic). When this happens medications that will cut down on inflammation (soreness) help. Sometimes a shot of cortisone into the joint will be helpful. If your teeth are not aligned it may help for your dentist to make a splint for your mouth that can help this problem. If no physical problems can be found, the problem may come from tension. If tension is found to be the cause, biofeedback or relaxation techniques may be helpful. HOME CARE INSTRUCTIONS   Later in the day, applications of ice packs may be helpful. Ice can be used in a plastic bag with a towel around it to prevent frostbite to skin. This may be used about every 2 hours for 20 to 30 minutes, as needed while awake, or as directed by your caregiver.  Only take over-the-counter or prescription medicines for pain, discomfort, or fever as directed by your caregiver.  If physical therapy was prescribed, follow your caregiver's directions.  Wear mouth appliances as directed if they were given. Document Released: 05/01/2001 Document Revised: 10/29/2011 Document Reviewed: 08/08/2008 [facility] Patient Information 2013 [place], [place].
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
## 2012-12-30 ENCOUNTER — Other Ambulatory Visit: Payer: BC Managed Care – PPO

## 2012-12-30 DIAGNOSIS — E7800 Pure hypercholesterolemia, unspecified: Secondary | ICD-10-CM

## 2012-12-30 DIAGNOSIS — R5383 Other fatigue: Secondary | ICD-10-CM

## 2012-12-30 DIAGNOSIS — R202 Paresthesia of skin: Secondary | ICD-10-CM

## 2012-12-30 DIAGNOSIS — R5381 Other malaise: Secondary | ICD-10-CM

## 2012-12-30 DIAGNOSIS — I10 Essential (primary) hypertension: Secondary | ICD-10-CM

## 2012-12-30 LAB — CBC WITH DIFFERENTIAL/PLATELET
Basophils Absolute: 0 10*3/uL (ref 0.0–0.1)
Basophils Relative: 1 % (ref 0–1)
Eosinophils Relative: 1 % (ref 0–5)
HCT: 35.9 % — ABNORMAL LOW (ref 36.0–46.0)
Hemoglobin: 12 g/dL (ref 12.0–15.0)
MCHC: 33.4 g/dL (ref 30.0–36.0)
MCV: 87.8 fL (ref 78.0–100.0)
Monocytes Absolute: 0.4 10*3/uL (ref 0.1–1.0)
Monocytes Relative: 12 % (ref 3–12)
Neutro Abs: 1.9 10*3/uL (ref 1.7–7.7)
RDW: 13.8 % (ref 11.5–15.5)

## 2012-12-30 LAB — COMPREHENSIVE METABOLIC PANEL
ALT: <8 U/L (ref 0–35)
AST: 16 U/L (ref 0–37)
Alkaline Phosphatase: 79 U/L (ref 39–117)
BUN: 15 mg/dL (ref 6–23)
Creat: 0.62 mg/dL (ref 0.50–1.10)
Total Bilirubin: 0.4 mg/dL (ref 0.3–1.2)

## 2012-12-30 LAB — VITAMIN B12: Vitamin B-12: 276 pg/mL (ref 211–911)

## 2012-12-30 LAB — LIPID PANEL
Cholesterol: 235 mg/dL — ABNORMAL HIGH (ref 0–200)
HDL: 71 mg/dL (ref >39)
LDL Cholesterol: 143 mg/dL — ABNORMAL HIGH (ref 0–99)
Total CHOL/HDL Ratio: 3.3 Ratio
Triglycerides: 103 mg/dL (ref <150)
VLDL: 21 mg/dL (ref 0–40)

## 2012-12-31 ENCOUNTER — Encounter: Payer: Self-pay | Admitting: Family Medicine

## 2012-12-31 DIAGNOSIS — E559 Vitamin D deficiency, unspecified: Secondary | ICD-10-CM | POA: Insufficient documentation

## 2012-12-31 LAB — VITAMIN D 25 HYDROXY (VIT D DEFICIENCY, FRACTURES): Vit D, 25-Hydroxy: 12 ng/mL — ABNORMAL LOW (ref 30–89)

## 2013-01-01 ENCOUNTER — Other Ambulatory Visit: Payer: Self-pay | Admitting: *Deleted

## 2013-01-01 DIAGNOSIS — E785 Hyperlipidemia, unspecified: Secondary | ICD-10-CM

## 2013-01-01 DIAGNOSIS — Z79899 Other long term (current) drug therapy: Secondary | ICD-10-CM

## 2013-01-01 DIAGNOSIS — E538 Deficiency of other specified B group vitamins: Secondary | ICD-10-CM

## 2013-01-01 DIAGNOSIS — E559 Vitamin D deficiency, unspecified: Secondary | ICD-10-CM

## 2013-01-01 MED ORDER — ATORVASTATIN CALCIUM 20 MG PO TABS: 20.0000 mg | ORAL_TABLET | Freq: Every day | ORAL | Status: DC

## 2013-01-01 MED ORDER — ERGOCALCIFEROL 1.25 MG (50000 UT) PO CAPS: 50000.0000 [IU] | ORAL_CAPSULE | ORAL | Status: DC

## 2013-01-26 ENCOUNTER — Ambulatory Visit (INDEPENDENT_AMBULATORY_CARE_PROVIDER_SITE_OTHER): Payer: BC Managed Care – PPO | Admitting: Family Medicine

## 2013-01-26 ENCOUNTER — Encounter: Payer: Self-pay | Admitting: Family Medicine

## 2013-01-26 VITALS — BP 120/78 | HR 72 | Ht 61.0 in | Wt 166.0 lb

## 2013-01-26 DIAGNOSIS — I10 Essential (primary) hypertension: Secondary | ICD-10-CM

## 2013-01-26 DIAGNOSIS — Z79899 Other long term (current) drug therapy: Secondary | ICD-10-CM

## 2013-01-26 DIAGNOSIS — E7800 Pure hypercholesterolemia, unspecified: Secondary | ICD-10-CM

## 2013-01-26 MED ORDER — PRAVASTATIN SODIUM 40 MG PO TABS: 40.0000 mg | ORAL_TABLET | Freq: Every day | ORAL | Status: DC

## 2013-01-26 MED ORDER — LISINOPRIL-HYDROCHLOROTHIAZIDE 10-12.5 MG PO TABS: 1.0000 | ORAL_TABLET | Freq: Every day | ORAL | Status: DC

## 2013-01-26 NOTE — Progress Notes (Signed)
Chief Complaint  Patient presents with  . Hypertension    1 month follow up.   Hypertension follow-up:  Started on lisinopril HCT at last visit.  Denies side effects.  Continues to have some headaches (right-sided, thinks due to not sleeping enough).  Denies any dizziness.  Has slight cough from allergies, and cough was present prior to starting the lisinopril.  BP's at [facility] have been running 144-150/101-102.  She also had 2 values that were 101-105/85-95  She cut back on all fried foods, as well as her sodium intake.  She hasn't been able to afford the $30 copay for the atorvastatin, never started it.  Lab Results  Component Value Date   CHOL 235* 12/30/2012   HDL 71 12/30/2012   LDLCALC 143* 12/30/2012   TRIG 103 12/30/2012   CHOLHDL 3.3 12/30/2012   Past Medical History  Diagnosis Date  . Hypertension age 57  . Hyperlipidemia   . Seasonal allergies   . Bell's palsy age 57    residual right sided weakness  . 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 Tubal ligation     History   Social History  . Marital Status: Single    Spouse Name: N/A    Number of Children: 4  . 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 Smoking status: Former Smoker    Types: Cigarettes    Quit date: 07/20/1982  . Smokeless tobacco: Never Used  . Alcohol Use: Yes     Comment: socially; 12 pack lasts about 10 days, up to 3/day, not daily (beer).  . Drug Use: No  . Sexually Active: Yes -- Female partner(s)     Comment: partner with ED   Other Topics Concern  . Not on file   Social History Narrative   Lives with youngest son.  Son in [place] and 2 daughter are in [place].  No pets   Allergies  Allergen Reactions  . Codeine Nausea And Vomiting   Current outpatient prescriptions:Cyanocobalamin (B-12) 5000 MCG SUBL, Place 1 tablet  under the tongue daily., Disp: , Rfl: ;  ergocalciferol (VITAMIN D2) 50000 UNITS capsule, Take 1 capsule (50,000 Units total) by mouth once a week., Disp: 12 capsule, Rfl: 0;  lisinopril-hydrochlorothiazide (PRINZIDE,ZESTORETIC) 10-12.5 MG per tablet, Take 1 tablet by mouth daily., Disp: 90 tablet, Rfl: 1 pravastatin (PRAVACHOL) 40 MG tablet, Take 1 tablet (40 mg total) by mouth daily., Disp: 30 tablet, Rfl: 2  PHYSICAL EXAM:  BP 140/88  Pulse 72  Ht 5\' 1"  (1.549 m)  Wt 166 lb (75.297 kg)  BMI 31.38 kg/m2 124/78 RA by MD; 120/78 on the left by MD Well developed, pleasant female in no distress Neck: no lymphadenopathy, thyromegaly or mass, no bruit Heart: regular rate and rhythm Lungs: clear bilaterally Extremities: no edema, 2+ pulse Neuro: alert and oriented.  Weakness of R side of face unchanged/chronic.  Normal gait  ASSESSMENT/PLAN:  Essential hypertension, benign - Plan: lisinopril-hydrochlorothiazide (PRINZIDE,ZESTORETIC) 10-12.5 MG per tablet, Basic metabolic panel  Pure hypercholesterolemia - Plan: pravastatin (PRAVACHOL) 40 MG tablet  Encounter for long-term (current) use of other medications - Plan: Basic metabolic panel   Hypertension:  Well controlled on current regimen.  Check b-met today Hyperlipidemia:  Goal LDL<130, so can use less expensive medication.  Start pravastatin  40mg  in place of Lipitor.  Recheck lipid and lft's in 2-3 months (as scheduled)

## 2013-01-26 NOTE — Patient Instructions (Signed)
Continue your current medications. Continue low sodium diet.  Follow low cholesterol diet. Start pravastatin. Okay to take in morning, but if you have stomach side effects, then try switching to bedtime.  Fat and Cholesterol Control Diet Cholesterol levels in your body are determined significantly by your diet. Cholesterol levels may also be related to heart disease. The following material helps to explain this relationship and discusses what you can do to help keep your heart healthy. Not all cholesterol is bad. Low-density lipoprotein (LDL) cholesterol is the "bad" cholesterol. It may cause fatty deposits to build up inside your arteries. High-density lipoprotein (HDL) cholesterol is "good." It helps to remove the "bad" LDL cholesterol from your blood. Cholesterol is a very important risk factor for heart disease. Other risk factors are high blood pressure, smoking, stress, heredity, and weight. The heart muscle gets its supply of blood through the coronary arteries. If your LDL cholesterol is high and your HDL cholesterol is low, you are at risk for having fatty deposits build up in your coronary arteries. This leaves less room through which blood can flow. Without sufficient blood and oxygen, the heart muscle cannot function properly and you may feel chest pains (angina pectoris). When a coronary artery closes up entirely, a part of the heart muscle may die causing a heart attack (myocardial infarction). CHECKING CHOLESTEROL When your caregiver sends your blood to a lab to be examined for cholesterol, a complete lipid (fat) profile may be done. With this test, the total amount of cholesterol and levels of LDL and HDL are determined. Triglycerides are a type of fat that circulates in the blood. They can also be used to determine heart disease risk. The list below describes what the numbers should be: Test: Total Cholesterol.  Less than 200 mg/dl. Test: LDL "bad cholesterol."  Less than 100  mg/dl.  Less than 70 mg/dl if you are at very high risk of a heart attack or sudden cardiac death. Test: HDL "good cholesterol."  Greater than 50 mg/dl for women.  Greater than 40 mg/dl for men. Test: Triglycerides.  Less than 150 mg/dl. CONTROLLING CHOLESTEROL WITH DIET Although exercise and lifestyle factors are important, your diet is key. That is because certain foods are known to raise cholesterol and others to lower it. The goal is to balance foods for their effect on cholesterol and more importantly, to replace saturated and trans fat with other types of fat, such as monounsaturated fat, polyunsaturated fat, and omega-3 fatty acids. On average, a person should consume no more than 15 to 17 g of saturated fat daily. Saturated and trans fats are considered "bad" fats, and they will raise LDL cholesterol. Saturated fats are primarily found in animal products such as meats, butter, and cream. However, that does not mean you need to give up all your favorite foods. Today, there are good tasting, low-fat, low-cholesterol substitutes for most of the things you like to eat. Choose low-fat or nonfat alternatives. Choose round or loin cuts of red meat. These types of cuts are lowest in fat and cholesterol. Chicken (without the skin), fish, veal, and ground [place] breast are great choices. Eliminate fatty meats, such as hot dogs and salami. Even shellfish have little or no saturated fat. Have a 3 oz (85 g) portion when you eat lean meat, poultry, or fish. Trans fats are also called "partially hydrogenated oils." They are oils that have been scientifically manipulated so that they are solid at room temperature resulting in a longer shelf life  and improved taste and texture of foods in which they are added. Trans fats are found in stick margarine, some tub margarines, cookies, crackers, and baked goods.  When baking and cooking, oils are a great substitute for butter. The monounsaturated oils are especially  beneficial since it is believed they lower LDL and raise HDL. The oils you should avoid entirely are saturated tropical oils, such as coconut and palm.  Remember to eat a lot from food groups that are naturally free of saturated and trans fat, including fish, fruit, vegetables, beans, grains (barley, rice, couscous, bulgur wheat), and pasta (without cream sauces).  IDENTIFYING FOODS THAT LOWER CHOLESTEROL  Soluble fiber may lower your cholesterol. This type of fiber is found in fruits such as apples, vegetables such as broccoli, potatoes, and carrots, legumes such as beans, peas, and lentils, and grains such as barley. Foods fortified with plant sterols (phytosterol) may also lower cholesterol. You should eat at least 2 g per day of these foods for a cholesterol lowering effect.  Read package labels to identify low-saturated fats, trans fat free, and low-fat foods at the supermarket. Select cheeses that have only 2 to 3 g saturated fat per ounce. Use a heart-healthy tub margarine that is free of trans fats or partially hydrogenated oil. When buying baked goods (cookies, crackers), avoid partially hydrogenated oils. Breads and muffins should be made from whole grains (whole-wheat or whole oat flour, instead of "flour" or "enriched flour"). Buy non-creamy canned soups with reduced salt and no added fats.  FOOD PREPARATION TECHNIQUES  Never deep-fry. If you must fry, either stir-fry, which uses very little fat, or use non-stick cooking sprays. When possible, broil, bake, or roast meats, and steam vegetables. Instead of putting butter or margarine on vegetables, use lemon and herbs, applesauce, and cinnamon (for squash and sweet potatoes), nonfat yogurt, salsa, and low-fat dressings for salads.  LOW-SATURATED FAT / LOW-FAT FOOD SUBSTITUTES Meats / Saturated Fat (g)  Avoid: Steak, marbled (3 oz/85 g) / 11 g  Choose: Steak, lean (3 oz/85 g) / 4 g  Avoid: Hamburger (3 oz/85 g) / 7 g  Choose: Hamburger, lean  (3 oz/85 g) / 5 g  Avoid: Ham (3 oz/85 g) / 6 g  Choose: Ham, lean cut (3 oz/85 g) / 2.4 g  Avoid: Chicken, with skin, dark meat (3 oz/85 g) / 4 g  Choose: Chicken, skin removed, dark meat (3 oz/85 g) / 2 g  Avoid: Chicken, with skin, light meat (3 oz/85 g) / 2.5 g  Choose: Chicken, skin removed, light meat (3 oz/85 g) / 1 g Dairy / Saturated Fat (g)  Avoid: Whole milk (1 cup) / 5 g  Choose: Low-fat milk, 2% (1 cup) / 3 g  Choose: Low-fat milk, 1% (1 cup) / 1.5 g  Choose: Skim milk (1 cup) / 0.3 g  Avoid: Hard cheese (1 oz/28 g) / 6 g  Choose: Skim milk cheese (1 oz/28 g) / 2 to 3 g  Avoid: Cottage cheese, 4% fat (1 cup) / 6.5 g  Choose: Low-fat cottage cheese, 1% fat (1 cup) / 1.5 g  Avoid: Ice cream (1 cup) / 9 g  Choose: Sherbet (1 cup) / 2.5 g  Choose: Nonfat frozen yogurt (1 cup) / 0.3 g  Choose: Frozen fruit bar / trace  Avoid: Whipped cream (1 tbs) / 3.5 g  Choose: Nondairy whipped topping (1 tbs) / 1 g Condiments / Saturated Fat (g)  Avoid: Mayonnaise (1 tbs) / 2  g  Choose: Low-fat mayonnaise (1 tbs) / 1 g  Avoid: Butter (1 tbs) / 7 g  Choose: Extra light margarine (1 tbs) / 1 g  Avoid: Coconut oil (1 tbs) / 11.8 g  Choose: Olive oil (1 tbs) / 1.8 g  Choose: Corn oil (1 tbs) / 1.7 g  Choose: Safflower oil (1 tbs) / 1.2 g  Choose: Sunflower oil (1 tbs) / 1.4 g  Choose: Soybean oil (1 tbs) / 2.4 g  Choose: Canola oil (1 tbs) / 1 g Document Released: 08/06/2005 Document Revised: 10/29/2011 Document Reviewed: 01/25/2011 [employer] Patient Information 2014 [place], [place].

## 2013-01-27 ENCOUNTER — Encounter: Payer: Self-pay | Admitting: Family Medicine

## 2013-01-27 LAB — BASIC METABOLIC PANEL WITH GFR
BUN: 19 mg/dL (ref 6–23)
CO2: 27 meq/L (ref 19–32)
Calcium: 9.2 mg/dL (ref 8.4–10.5)
Chloride: 101 meq/L (ref 96–112)
Creat: 0.6 mg/dL (ref 0.50–1.10)
Glucose, Bld: 97 mg/dL (ref 70–99)
Potassium: 4.2 meq/L (ref 3.5–5.3)
Sodium: 137 meq/L (ref 135–145)

## 2013-03-04 ENCOUNTER — Telehealth: Payer: Self-pay | Admitting: Family Medicine

## 2013-03-04 NOTE — Telephone Encounter (Signed)
Please schedule OV for eval

## 2013-03-05 ENCOUNTER — Ambulatory Visit (INDEPENDENT_AMBULATORY_CARE_PROVIDER_SITE_OTHER): Payer: BC Managed Care – PPO | Admitting: Family Medicine

## 2013-03-05 ENCOUNTER — Emergency Department (HOSPITAL_COMMUNITY): Payer: BC Managed Care – PPO

## 2013-03-05 ENCOUNTER — Emergency Department (HOSPITAL_COMMUNITY)
Admission: EM | Admit: 2013-03-05 | Discharge: 2013-03-05 | Disposition: A | Discharge status: Home / Self Care | Payer: BC Managed Care – PPO | Attending: Emergency Medicine | Admitting: Emergency Medicine

## 2013-03-05 ENCOUNTER — Encounter (HOSPITAL_COMMUNITY): Payer: Self-pay

## 2013-03-05 ENCOUNTER — Encounter: Payer: Self-pay | Admitting: Family Medicine

## 2013-03-05 VITALS — BP 164/104 | HR 68 | Ht 61.0 in | Wt 168.0 lb

## 2013-03-05 DIAGNOSIS — R42 Dizziness and giddiness: Secondary | ICD-10-CM | POA: Insufficient documentation

## 2013-03-05 DIAGNOSIS — Z87891 Personal history of nicotine dependence: Secondary | ICD-10-CM | POA: Insufficient documentation

## 2013-03-05 DIAGNOSIS — E785 Hyperlipidemia, unspecified: Secondary | ICD-10-CM | POA: Insufficient documentation

## 2013-03-05 DIAGNOSIS — R55 Syncope and collapse: Secondary | ICD-10-CM

## 2013-03-05 DIAGNOSIS — Z8669 Personal history of other diseases of the nervous system and sense organs: Secondary | ICD-10-CM | POA: Insufficient documentation

## 2013-03-05 DIAGNOSIS — R5381 Other malaise: Secondary | ICD-10-CM | POA: Insufficient documentation

## 2013-03-05 DIAGNOSIS — Z79899 Other long term (current) drug therapy: Secondary | ICD-10-CM | POA: Insufficient documentation

## 2013-03-05 DIAGNOSIS — I10 Essential (primary) hypertension: Secondary | ICD-10-CM | POA: Insufficient documentation

## 2013-03-05 DIAGNOSIS — G8929 Other chronic pain: Secondary | ICD-10-CM | POA: Insufficient documentation

## 2013-03-05 LAB — URINALYSIS, ROUTINE W REFLEX MICROSCOPIC
Glucose, UA: NEGATIVE mg/dL
Ketones, ur: NEGATIVE mg/dL
Leukocytes, UA: NEGATIVE
Nitrite: NEGATIVE
Specific Gravity, Urine: 1.006 (ref 1.005–1.030)
pH: 6.5 (ref 5.0–8.0)

## 2013-03-05 LAB — COMPREHENSIVE METABOLIC PANEL
Albumin: 3.8 g/dL (ref 3.5–5.2)
Alkaline Phosphatase: 81 U/L (ref 39–117)
BUN: 11 mg/dL (ref 6–23)
Calcium: 9.6 mg/dL (ref 8.4–10.5)
Creatinine, Ser: 0.63 mg/dL (ref 0.50–1.10)
GFR calc Af Amer: >90 mL/min (ref >90)
Glucose, Bld: 110 mg/dL — ABNORMAL HIGH (ref 70–99)
Total Protein: 7.3 g/dL (ref 6.0–8.3)

## 2013-03-05 LAB — POCT I-STAT TROPONIN I: Troponin i, poc: 0 ng/mL (ref 0.00–0.08)

## 2013-03-05 LAB — CBC WITH DIFFERENTIAL/PLATELET
Basophils Relative: 1 % (ref 0–1)
Eosinophils Absolute: 0 10*3/uL (ref 0.0–0.7)
Eosinophils Relative: 0 % (ref 0–5)
Hemoglobin: 11.6 g/dL — ABNORMAL LOW (ref 12.0–15.0)
Lymphs Abs: 1.1 10*3/uL (ref 0.7–4.0)
MCH: 29.5 pg (ref 26.0–34.0)
MCHC: 32.4 g/dL (ref 30.0–36.0)
MCV: 91.1 fL (ref 78.0–100.0)
Monocytes Relative: 10 % (ref 3–12)
Neutrophils Relative %: 46 % (ref 43–77)
RBC: 3.93 MIL/uL (ref 3.87–5.11)

## 2013-03-05 MED ORDER — MECLIZINE HCL 25 MG PO TABS: 25.0000 mg | ORAL_TABLET | Freq: Four times a day (QID) | ORAL | Status: DC

## 2013-03-05 MED ORDER — MECLIZINE HCL 25 MG PO TABS
25.0000 mg | ORAL_TABLET | Freq: Once | ORAL | Status: AC
  Administered 2013-03-05: 25 mg via ORAL
  Filled 2013-03-05: qty 1

## 2013-03-05 NOTE — ED Provider Notes (Signed)
History    CSN: [id number] Arrival date & time 03/05/13  1129  First MD Initiated Contact with Patient 03/05/13 1241     Chief Complaint  Patient presents with  . Dizziness   (Consider location/radiation/quality/duration/timing/severity/associated sxs/prior Treatment) Patient is a 57 y.o. female presenting with neurologic complaint.  Neurologic Problem Chronicity: Pt is 57 yo woman with onset of dizziness and weakness in church this Sunday, 4 days ago.  She was able to work Monday and Tuesday, but last night dizziness became worse. The current episode started more than 2 days ago (She was seen by her family physician, Joselyn Arrow, M.D., who did EKG which was negative, and who sent her to the ED for further evaluation.). The problem occurs constantly. The problem has been gradually worsening. Pertinent negatives include no chest pain, no abdominal pain, no headaches and no shortness of breath. Nothing aggravates the symptoms. Nothing relieves the symptoms. She has tried nothing for the symptoms.   Past Medical History  Diagnosis Date  . Hypertension age 70  . Hyperlipidemia   . Seasonal allergies   . Bell's palsy age 39    residual right sided weakness  . 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 Tubal ligation     Family History  Problem Relation Age of Onset  . Hypertension Mother   . Alcohol abuse Mother   . Alcohol abuse Father   . Alcohol abuse Brother   . Heart disease Brother 3  . Cancer Paternal Aunt     breast (?)  . Stroke Neg Hx   . Colon cancer Neg Hx   . Anxiety disorder Daughter     panic attacks  . Colon polyps Sister    History  Substance Use Topics  . Smoking status: Former Smoker    Types: Cigarettes    Quit date: 07/20/1982  . Smokeless tobacco: Never Used  . Alcohol Use: Yes     Comment: socially; 12 pack lasts about 10 days, up to 3/day, not daily (beer).    OB History   Grav Para Term Preterm Abortions TAB SAB Ect Mult Living                 Review of Systems  Constitutional: Negative for fever and chills.  HENT: Negative.   Eyes: Negative.   Respiratory: Negative.  Negative for shortness of breath.   Cardiovascular: Negative for chest pain.  Gastrointestinal: Positive for nausea. Negative for vomiting, abdominal pain and diarrhea.  Genitourinary: Negative.   Musculoskeletal: Negative.   Neurological: Positive for dizziness. Negative for speech difficulty, numbness and headaches.       She has mild right facial weakness from prior Bell's palsy.  Psychiatric/Behavioral: Negative.     Allergies  Codeine and Tomato  Home Medications   Current Outpatient Rx  Name  Route  Sig  Dispense  Refill  . acetaminophen (TYLENOL) 325 MG tablet   Oral   Take 650 mg by mouth every 6 (six) hours as needed for pain.         . Cyanocobalamin (B-12) 5000 MCG SUBL   Sublingual   Place 1 tablet under the tongue daily.         . ergocalciferol (VITAMIN D2) 50000 UNITS capsule   Oral   Take 1 capsule (50,000 Units total) by mouth once a week.   12 capsule  0   . lisinopril-hydrochlorothiazide (PRINZIDE,ZESTORETIC) 10-12.5 MG per tablet   Oral   Take 1 tablet by mouth daily.   90 tablet   1   . pravastatin (PRAVACHOL) 40 MG tablet   Oral   Take 1 tablet (40 mg total) by mouth daily.   30 tablet   2   . meclizine (ANTIVERT) 25 MG tablet   Oral   Take 1 tablet (25 mg total) by mouth 4 (four) times daily.   28 tablet   0    BP 150/90  Pulse 65  Temp(Src) 98 F (36.7 C) (Oral)  Wt 168 lb (76.204 kg)  BMI 31.76 kg/m2  SpO2 100% Physical Exam  Nursing note and vitals reviewed. Constitutional: She is oriented to person, place, and time. She appears well-developed and well-nourished. No distress.  HENT:  Head: Normocephalic and atraumatic.  Right Ear: External ear normal.  Left Ear: External ear normal.  Mouth/Throat:  Oropharynx is clear and moist.  Eyes: Conjunctivae and EOM are normal. Pupils are equal, round, and reactive to light.  No nystagmus.  Neck: Normal range of motion. Neck supple.  No carotid bruit.  Cardiovascular: Normal rate, regular rhythm and normal heart sounds.   Pulmonary/Chest: Effort normal and breath sounds normal.  Abdominal: Soft. Bowel sounds are normal.  Musculoskeletal: Normal range of motion. She exhibits no edema and no tenderness.  Lymphadenopathy:    She has no cervical adenopathy.  Neurological: She is alert and oriented to person, place, and time.  No sensory or motor deficit.  Skin: Skin is warm and dry.  Psychiatric: She has a normal mood and affect. Her behavior is normal.    ED Course  Procedures (including critical care time)  Results for orders placed during the hospital encounter of 03/05/13  CBC WITH DIFFERENTIAL      Result Value Range   WBC 2.5 (*) 4.0 - 10.5 K/uL   RBC 3.93  3.87 - 5.11 MIL/uL   Hemoglobin 11.6 (*) 12.0 - 15.0 g/dL   HCT 16.1 (*) 09.6 - 04.5 %   MCV 91.1  78.0 - 100.0 fL   MCH 29.5  26.0 - 34.0 pg   MCHC 32.4  30.0 - 36.0 g/dL   RDW 40.9  81.1 - 91.4 %   Platelets 230  150 - 400 K/uL   Neutrophils Relative % 46  43 - 77 %   Neutro Abs 1.1 (*) 1.7 - 7.7 K/uL   Lymphocytes Relative 43  12 - 46 %   Lymphs Abs 1.1  0.7 - 4.0 K/uL   Monocytes Relative 10  3 - 12 %   Monocytes Absolute 0.2  0.1 - 1.0 K/uL   Eosinophils Relative 0  0 - 5 %   Eosinophils Absolute 0.0  0.0 - 0.7 K/uL   Basophils Relative 1  0 - 1 %   Basophils Absolute 0.0  0.0 - 0.1 K/uL  COMPREHENSIVE METABOLIC PANEL      Result Value Range   Sodium 139  135 - 145 mEq/L   Potassium 4.2  3.5 - 5.1 mEq/L   Chloride 103  96 - 112 mEq/L   CO2 28  19 - 32 mEq/L   Glucose, Bld 110 (*) 70 - 99 mg/dL   BUN 11  6 - 23 mg/dL   Creatinine, Ser 7.82  0.50 - 1.10 mg/dL   Calcium 9.6  8.4 - 95.6 mg/dL   Total Protein 7.3  6.0 - 8.3 g/dL   Albumin  3.8  3.5 - 5.2 g/dL    AST 17  0 - 37 U/L   ALT 7  0 - 35 U/L   Alkaline Phosphatase 81  39 - 117 U/L   Total Bilirubin 0.3  0.3 - 1.2 mg/dL   GFR calc non Af Amer >90  >90 mL/min   GFR calc Af Amer >90  >90 mL/min  URINALYSIS, ROUTINE W REFLEX MICROSCOPIC      Result Value Range   Color, Urine YELLOW  YELLOW   APPearance CLEAR  CLEAR   Specific Gravity, Urine 1.006  1.005 - 1.030   pH 6.5  5.0 - 8.0   Glucose, UA NEGATIVE  NEGATIVE mg/dL   Hgb urine dipstick NEGATIVE  NEGATIVE   Bilirubin Urine NEGATIVE  NEGATIVE   Ketones, ur NEGATIVE  NEGATIVE mg/dL   Protein, ur NEGATIVE  NEGATIVE mg/dL   Urobilinogen, UA 0.2  0.0 - 1.0 mg/dL   Nitrite NEGATIVE  NEGATIVE   Leukocytes, UA NEGATIVE  NEGATIVE  POCT I-STAT TROPONIN I      Result Value Range   Troponin i, poc 0.00  0.00 - 0.08 ng/mL   Comment 3            Ct Head Wo Contrast  03/05/2013   *RADIOLOGY REPORT*  Clinical Data:  Dizziness  CT HEAD WITHOUT CONTRAST  Technique:  Contiguous axial images were obtained from the base of the skull through the vertex without contrast  Comparison:  MRI 08/22/2007  Findings:  The brain has a normal appearance without evidence for hemorrhage, acute infarction, hydrocephalus, or mass lesion.  There is no extra axial fluid collection.  The skull and paranasal sinuses are normal.  IMPRESSION: Normal CT of the head without contrast.   Original Report Authenticated By: Janeece Riggers, M.D.    Lab workup was negative.  She was given Antivert 25 mg with significant relief.  Rx Antivert 25 mg qid x 7 days for vertigo.  1. Vertigo      Carleene Cooper III, MD 03/05/13 [phone]

## 2013-03-05 NOTE — Progress Notes (Signed)
Chief Complaint  Patient presents with  . Dizziness    has been feeling dizzy and "woozy" since Sunday. Also has been having sweating and then chills off and on.    4 days ago, around 10:30 am, and while in church she felt hot, started sweating, and felt very dizzy and nauseated.  She had some ringing in her left ear, and had ongoing dizziness and lightheadedness throughout the day.  She tried drinking a lot of water, but it didn't seem to help, and she reports that she apparently passed out, woke up with people standing over her.  She required help going upstairs--felt weak, dizzy, and head felt fuzzy; someone else drove her home. She had trouble walking, felt off balance, and required assistance.  She slept for a few hours, and doesn't really recall her son being at her house.  He had to feed her, she couldn't hold spoon to feed herself. She doesn't recall any of this.  Felt better that evening.    The next day she went to work, felt a little lightheaded.  Slept a lot after work.  2 days ago, while in shower she thought she might fall, felt lightheaded.  Shona Needles (Wed) she went to work.  Only mildly lightheaded.  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  She completely forgot what she was doing for a short time while at work (short-lived).  She had called the office yesterday morning, was asked to come in for eval but she went to work and instead made appt for today.  She reports feeling worse today. She wouldn't be able to work today even if she was scheduled to.  She is feeling very dizzy, off balance.  She drove herself here today.  Room "moved", trouble walking straight today after standing to walk to exam room from the waiting room.  Nurse reports she needed to hold onto the walls.  Has some palpitations, feels like her heart is beating fast and hard, usually after she has been  feeling bad. Some headaches the last few days, relieved by tylenol.  Headaches were posterior, radiating to front, sometimes on the left side, and down the left neck.  Past Medical History  Diagnosis Date  . Hypertension age 43  . Hyperlipidemia   . Seasonal allergies   . Bell's palsy age 23    residual right sided weakness  . 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 Tubal ligation     History   Social History  . Marital Status: Single    Spouse Name: N/A    Number of Children: 4  . Years of Education: N/A   Occupational History  . cook at [employer]    Social History Main Topics  . Smoking status: Former Smoker    Types: Cigarettes    Quit date: 07/20/1982  . Smokeless tobacco: Never Used  . Alcohol Use: Yes     Comment: socially; 12 pack lasts about 10 days, up to 3/day, not daily (beer).  . Drug Use: No  . Sexually Active: Yes -- Female partner(s)     Comment: partner with ED   Other Topics Concern  . Not on file   Social History Narrative  Lives with youngest son.  Son in [place] and 2 daughter are in [place].  No pets    Current Outpatient Prescriptions on File Prior to Visit  Medication Sig Dispense Refill  . Cyanocobalamin (B-12) 5000 MCG SUBL Place 1 tablet under the tongue daily.      . ergocalciferol (VITAMIN D2) 50000 UNITS capsule Take 1 capsule (50,000 Units total) by mouth once a week.  12 capsule  0  . lisinopril-hydrochlorothiazide (PRINZIDE,ZESTORETIC) 10-12.5 MG per tablet Take 1 tablet by mouth daily.  90 tablet  1  . pravastatin (PRAVACHOL) 40 MG tablet Take 1 tablet (40 mg total) by mouth daily.  30 tablet  2   No current facility-administered medications on file prior to visit.   Allergies  Allergen Reactions  . Codeine Nausea And Vomiting   ROS:  Denies fevers, URI symptoms, ear pain.  Had some short-lived ear ringing that resolved.   No chest pain, shortness of breath. Ongoing tingling in arms/hands, not improved since taking B12 supplement. Facial weakness unchanged from prior Bell's palsy.  Denies vomiting or diarrhea, skin rash, bleeding, bruising or other concerns.  PHYSICAL EXAM:  BP 142/100  Pulse 72  Ht 5\' 1"  (1.549 m)  Wt 168 lb (76.204 kg)  BMI 31.76 kg/m2 158/98 on repeat by MD, RA Pleasant female, who appears comfortable at rest, but with any movement looked very unstable, dizzy and ill. HEENT:  PERRL, conjunctiva clear.  Chronic right sided facial weakness--unchanged. TM's and EACs normal.  OP clear Neck: no lymphadenopathy, thyromegaly or carotid bruit Heart: regular rate and rhythm Lungs: clear bilaterally Abdomen: soft, nontender Extremities: no edema Skin: no rashes/lesions/bruising Neuro: alert and oriented.  Chronic R sided facial weakness, unchanged.  Normal strength.  Abnormal gait due to vertigo.  She had symptoms of vertigo when going from lying to sitting.  Occurred both with head straight forward and head to the right. Did not continue with testing due to severity of symptoms/vertigo.  Appeared unsteady going from sitting to standing  ASSESSMENT/PLAN:  Syncope  Essential hypertension, benign - Plan: EKG 12-Lead  Vertigo  57 yo female with HTN, with elevated BP today, with history concerning for possible syncopal episode and followed by some altered mental status later that day.  Those symptoms have resolved, but has ongoing and worsening vertigo. Referred to ER for further evaluation including CT scan and labs, as well as further monitoring.  EKG in office today was normal. Unsafe for her to drive currently--offered EMS transport but she found a friend to drive her.  Case discussed with Dr. Ignacia Palma at [facility] ED.

## 2013-03-05 NOTE — ED Notes (Signed)
Pt. Began having periods of dizziness Since Sunday.  She went to her Dr. Laurel Dimmer office this am and they did and EKG which was normal   But she wanted pt. To have a CT scan.  No nuero deficits noted. Pt. Does have Bells Palsy from years ago. Rt. Facial droop.  Pt. Reports having a headache that started 1 hour ago.   Pt. Is alert and oriented X4.  Movement increases the dizziness.

## 2013-03-05 NOTE — Patient Instructions (Signed)
PLEASE GO DIRECTLY TO THE EMERGENCY ROOM for further evaluation.  I am concerned about your fainting spell and episode of unclear thinking you had on Sunday.

## 2013-04-09 ENCOUNTER — Other Ambulatory Visit: Payer: BC Managed Care – PPO

## 2013-04-09 DIAGNOSIS — E559 Vitamin D deficiency, unspecified: Secondary | ICD-10-CM

## 2013-04-09 DIAGNOSIS — Z79899 Other long term (current) drug therapy: Secondary | ICD-10-CM

## 2013-04-09 DIAGNOSIS — E785 Hyperlipidemia, unspecified: Secondary | ICD-10-CM

## 2013-04-09 DIAGNOSIS — E538 Deficiency of other specified B group vitamins: Secondary | ICD-10-CM

## 2013-04-10 LAB — HEPATIC FUNCTION PANEL
ALT: 9 U/L (ref 0–35)
AST: 20 U/L (ref 0–37)
Albumin: 4.1 g/dL (ref 3.5–5.2)
Alkaline Phosphatase: 73 U/L (ref 39–117)
Total Protein: 7.3 g/dL (ref 6.0–8.3)

## 2013-04-10 LAB — LIPID PANEL
HDL: 48 mg/dL (ref >39)
Total CHOL/HDL Ratio: 4.4 Ratio
Triglycerides: 550 mg/dL — ABNORMAL HIGH (ref <150)

## 2013-04-10 LAB — VITAMIN B12: Vitamin B-12: 573 pg/mL (ref 211–911)

## 2013-04-13 ENCOUNTER — Telehealth: Payer: Self-pay | Admitting: *Deleted

## 2013-04-13 NOTE — Telephone Encounter (Signed)
Spoke with patient and she was fasting and she did take her cholesterol medication. She is now out of her medication, took last dose morning of blood draw but can't pick up new rx until she gets paid Friday-I did schedule her for this Wed for OV as requested(wanted to make sure that it was ok that she come in even though she will be off meds x 1 week at that appt).

## 2013-04-13 NOTE — Telephone Encounter (Signed)
That's fine.  It shows that her meds were not working well enough when taking them.  Okay to stay off meds until seen

## 2013-04-15 ENCOUNTER — Ambulatory Visit: Payer: Self-pay | Admitting: Family Medicine

## 2013-04-22 ENCOUNTER — Ambulatory Visit: Payer: Self-pay | Admitting: Family Medicine

## 2013-05-04 ENCOUNTER — Ambulatory Visit (INDEPENDENT_AMBULATORY_CARE_PROVIDER_SITE_OTHER): Payer: BC Managed Care – PPO | Admitting: Family Medicine

## 2013-05-04 ENCOUNTER — Encounter: Payer: Self-pay | Admitting: Family Medicine

## 2013-05-04 VITALS — BP 130/84 | HR 72 | Ht 61.0 in | Wt 170.0 lb

## 2013-05-04 DIAGNOSIS — E559 Vitamin D deficiency, unspecified: Secondary | ICD-10-CM

## 2013-05-04 DIAGNOSIS — R202 Paresthesia of skin: Secondary | ICD-10-CM

## 2013-05-04 DIAGNOSIS — R209 Unspecified disturbances of skin sensation: Secondary | ICD-10-CM

## 2013-05-04 DIAGNOSIS — G5600 Carpal tunnel syndrome, unspecified upper limb: Secondary | ICD-10-CM

## 2013-05-04 DIAGNOSIS — I10 Essential (primary) hypertension: Secondary | ICD-10-CM

## 2013-05-04 DIAGNOSIS — E782 Mixed hyperlipidemia: Secondary | ICD-10-CM | POA: Insufficient documentation

## 2013-05-04 NOTE — Progress Notes (Signed)
Chief Complaint  Patient presents with  . Follow-up    follow up labs. Would like to talk to you today about her circulation, her hand are falling asleep all the time. Patient declines flu vaccine.    Hyperlipidemia follow-up:  Patient is reportedly following a low-fat, low cholesterol diet.  She had black coffee prior to her last labs.  She had run out of the pravastatin for about 10 days prior to her last labs.  She is back on it now.  Denies side effects to medication.  She states that the medication from [facility] is costing her $15 for 30 days (and is getting the generic). She was out for that time related to finances/cost. She had been eating more pasta with butter, and eating sweets.  Complaining of "circulation" problems.  She is having numbness in L>R hands,  Occurs with driving, curling hair, "any time", frequently.  Denies any weakness, or other neurologic symptoms.  Past Medical History  Diagnosis Date  . Hypertension age 6  . Hyperlipidemia   . Seasonal allergies   . Bell's palsy age 40    residual right sided weakness  . 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 Tubal ligation     History   Social History  . Marital Status: Single    Spouse Name: N/A    Number of Children: 4  . Years of Education: N/A   Occupational History  . cook at [employer]    Social History Main Topics  . Smoking status: Former Smoker    Types: Cigarettes    Quit date: 07/20/1982  . Smokeless tobacco: Never Used  . Alcohol Use: Yes     Comment: socially; 12 pack lasts about 10 days, up to 3/day, not daily (beer).  . Drug Use: No  . Sexual Activity: Yes    Partners: Male     Comment: partner with ED   Other Topics Concern  . Not on file   Social History Narrative   Lives with youngest son.  Son in [place] and 2 daughter are in [place].  No pets    Current outpatient  prescriptions:acetaminophen (TYLENOL) 500 MG tablet, Take 1,000 mg by mouth 3 (three) times daily., Disp: , Rfl: ;  diphenhydrAMINE (BENADRYL) 50 MG capsule, Take 50 mg by mouth at bedtime., Disp: , Rfl: ;  lisinopril-hydrochlorothiazide (PRINZIDE,ZESTORETIC) 10-12.5 MG per tablet, Take 1 tablet by mouth daily., Disp: 90 tablet, Rfl: 1 pravastatin (PRAVACHOL) 40 MG tablet, Take 1 tablet (40 mg total) by mouth daily., Disp: 30 tablet, Rfl: 2;  meclizine (ANTIVERT) 25 MG tablet, Take 1 tablet (25 mg total) by mouth 4 (four) times daily., Disp: 28 tablet, Rfl: 0  Allergies  Allergen Reactions  . Codeine Nausea And Vomiting  . Tomato Itching   ROS:  Denies fevers, URI symptoms, cough, shortness of breath, chest pain, palpitations, nausea, vomiting, bowel changes.  Denies headaches.  Vertigo resolved. No bleeding/bruising/rashes or other concerns.  Normal moods.  PHYSICAL EXAM: BP 130/84  Pulse 72  Ht 5\' 1"  (1.549 m)  Wt 170 lb (77.111 kg)  BMI 32.14 kg/m2 Well developed, pleasant female in no distress Neck: no lymphadenopathy or mass Heart: regular rate and rhythm Lungs: clear Abdomen: nontender Extremities: +phalen's with tingling developing in first through fourth fingers.  Negative tinel.  Normal strength, no atrophy. No edema Psych:  Normal mood,  affect, hygiene and grooming Lab Results  Component Value Date   CHOL 210* 04/09/2013   HDL 48 04/09/2013   LDLCALC Comment:   Not calculated due to Triglyceride >400. Suggest ordering Direct LDL (Unit Code: 16109).   Total Cholesterol/HDL Ratio:CHD Risk                        Coronary Heart Disease Risk Table                                        Men       Women          1/2 Average Risk              3.4        3.3              Average Risk              5.0        4.4           2X Average Risk              9.6        7.1           3X Average Risk             23.4       11.0 Use the calculated Patient Ratio above and the CHD Risk table  to  determine the patient's CHD Risk. ATP III Classification (LDL):       < 100        mg/dL         Optimal      604 - 129     mg/dL         Near or Above Optimal      130 - 159     mg/dL         Borderline High      160 - 189     mg/dL         High       > 540        mg/dL         Very High   9/81/1914   TRIG 550* 04/09/2013   CHOLHDL 4.4 04/09/2013   Lab Results  Component Value Date   ALT 9 04/09/2013   AST 20 04/09/2013   ALKPHOS 73 04/09/2013   BILITOT 0.2* 04/09/2013   Vitamin D-OH 28 Vitamin B12 573  ASSESSMENT/PLAN:  Mixed hyperlipidemia  Paresthesias  Unspecified vitamin D deficiency  Essential hypertension, benign  Carpal tunnel syndrome, unspecified laterality  Return fasting for physical (come in early that morning for labs, CPE is at 2pm).  Cut back on sweets/junk in diet. Start omega-3 fish oil 3000-4000mg  daily Continue the pravastatin.  Check with [facility] on cost of meds (?$4/month $10/90 day)  Recheck triglycerides at next visit (come early for fasting labs)  Start taking 1000 IU of Vitamin D3 daily (either as a separate vitamin, or contained in a multi-vitamin--read the label).  I recommend that you continue getting B12 in a vitamin (multivitamin or separate)--your levels are much improved compared to last time, so I don't believe that the Vitamin B12 is causing the numbness in your fingers at all  Carpal tunnel syndrome--wear the wrist braces.  Take a course of  ibuprofen or aleve (800mg  three times daily for 7-10 days of ibuprofen, or two Aleve twice daily for 7-10 days)--take these with food, and cut back on dose if it bothers your stomach.  If you have ongoing pain, you may need referral.

## 2013-05-04 NOTE — Patient Instructions (Addendum)
Cut back on sweets/junk in diet. Start omega-3 fish oil 3000-4000mg  daily Continue the pravastatin.   Recheck triglycerides at next visit (come early for fasting labs)  Start taking 1000 IU of Vitamin D3 daily (either as a separate vitamin, or contained in a multi-vitamin--read the label). I recommend that you continue getting B12 in a vitamin (multivitamin or separate)--your levels are much improved compared to last time, so I don't believe that the Vitamin B12 is causing the numbness in your fingers at all  Carpal tunnel syndrome--wear the wrist braces.  Take a course of ibuprofen or aleve (800mg  three times daily for 7-10 days of ibuprofen, or two Aleve twice daily for 7-10 days)--take these with food, and cut back on dose if it bothers your stomach.  If you have ongoing pain, you may need referral.  Carpal Tunnel Syndrome The carpal tunnel is a narrow area located on the palm side of your wrist. The tunnel is formed by the wrist bones and ligaments. Nerves, blood vessels, and tendons pass through the carpal tunnel. Repeated wrist motion or certain diseases may cause swelling within the tunnel. This swelling pinches the main nerve in the wrist (median nerve) and causes the painful hand and arm condition called carpal tunnel syndrome. CAUSES   Repeated wrist motions.  Wrist injuries.  Certain diseases like arthritis, diabetes, alcoholism, hyperthyroidism, and kidney failure.  Obesity.  Pregnancy. SYMPTOMS   A "pins and needles" feeling in your fingers or hand.  Tingling or numbness in your fingers or hand.  An aching feeling in your entire arm.  Wrist pain that goes up your arm to your shoulder.  Pain that goes down into your palm or fingers.  A weak feeling in your hands. DIAGNOSIS  Your caregiver will take your history and perform a physical exam. An electromyography test may be needed. This test measures electrical signals sent out by the muscles. The electrical signals are  usually slowed by carpal tunnel syndrome. You may also need X-rays. TREATMENT  Carpal tunnel syndrome may clear up by itself. Your caregiver may recommend a wrist splint or medicine such as a nonsteroidal anti-inflammatory medicine. Cortisone injections may help. Sometimes, surgery may be needed to free the pinched nerve.  HOME CARE INSTRUCTIONS   Take all medicine as directed by your caregiver. Only take over-the-counter or prescription medicines for pain, discomfort, or fever as directed by your caregiver.  If you were given a splint to keep your wrist from bending, wear it as directed. It is important to wear the splint at night. Wear the splint for as long as you have pain or numbness in your hand, arm, or wrist. This may take 1 to 2 months.  Rest your wrist from any activity that may be causing your pain. If your symptoms are work-related, you may need to talk to your employer about changing to a job that does not require using your wrist.  Put ice on your wrist after long periods of wrist activity.  Put ice in a plastic bag.  Place a towel between your skin and the bag.  Leave the ice on for 15-20 minutes, 3-4 times a day.  Keep all follow-up visits as directed by your caregiver. This includes any orthopedic referrals, physical therapy, and rehabilitation. Any delay in getting necessary care could result in a delay or failure of your condition to heal. SEEK IMMEDIATE MEDICAL CARE IF:   You have new, unexplained symptoms.  Your symptoms get worse and are not helped or  controlled with medicines. MAKE SURE YOU:   Understand these instructions.  Will watch your condition.  Will get help right away if you are not doing well or get worse. Document Released: 08/03/2000 Document Revised: 10/29/2011 Document Reviewed: 06/22/2011 [facility] Patient Information 2014 [place], [place].

## 2013-05-16 ENCOUNTER — Other Ambulatory Visit: Payer: Self-pay | Admitting: Family Medicine

## 2013-05-18 ENCOUNTER — Ambulatory Visit (INDEPENDENT_AMBULATORY_CARE_PROVIDER_SITE_OTHER): Payer: BC Managed Care – PPO | Admitting: Family Medicine

## 2013-05-18 ENCOUNTER — Telehealth: Payer: Self-pay | Admitting: Family Medicine

## 2013-05-18 ENCOUNTER — Other Ambulatory Visit (HOSPITAL_COMMUNITY)
Admission: RE | Admit: 2013-05-18 | Discharge: 2013-05-18 | Disposition: A | Discharge status: Home / Self Care | Payer: BC Managed Care – PPO | Source: Ambulatory Visit | Attending: Family Medicine | Admitting: Family Medicine

## 2013-05-18 ENCOUNTER — Encounter: Payer: Self-pay | Admitting: Family Medicine

## 2013-05-18 VITALS — BP 132/74 | HR 80 | Ht 61.0 in | Wt 174.0 lb

## 2013-05-18 DIAGNOSIS — Z01419 Encounter for gynecological examination (general) (routine) without abnormal findings: Secondary | ICD-10-CM | POA: Insufficient documentation

## 2013-05-18 DIAGNOSIS — Z1151 Encounter for screening for human papillomavirus (HPV): Secondary | ICD-10-CM | POA: Insufficient documentation

## 2013-05-18 DIAGNOSIS — E7800 Pure hypercholesterolemia, unspecified: Secondary | ICD-10-CM

## 2013-05-18 DIAGNOSIS — E782 Mixed hyperlipidemia: Secondary | ICD-10-CM

## 2013-05-18 DIAGNOSIS — Z113 Encounter for screening for infections with a predominantly sexual mode of transmission: Secondary | ICD-10-CM | POA: Insufficient documentation

## 2013-05-18 DIAGNOSIS — Z0000 Encounter for general adult medical examination without abnormal findings: Secondary | ICD-10-CM

## 2013-05-18 LAB — LIPID PANEL
HDL: 67 mg/dL (ref >39)
LDL Cholesterol: 83 mg/dL (ref 0–99)
Total CHOL/HDL Ratio: 2.6 Ratio
VLDL: 22 mg/dL (ref 0–40)

## 2013-05-18 LAB — POCT URINALYSIS DIPSTICK
Bilirubin, UA: NEGATIVE
Blood, UA: NEGATIVE
Ketones, UA: NEGATIVE
Protein, UA: NEGATIVE
Spec Grav, UA: 1.01
pH, UA: 5

## 2013-05-18 NOTE — Telephone Encounter (Signed)
Spoke with Dr.Knapp and she will refill later on today when she comes back for CPE later today.

## 2013-05-18 NOTE — Patient Instructions (Addendum)
HEALTH MAINTENANCE RECOMMENDATIONS:  It is recommended that you get at least 30 minutes of aerobic exercise at least 5 days/week (for weight loss, you may need as much as 60-90 minutes). This can be any activity that gets your heart rate up. This can be divided in 10-15 minute intervals if needed, but try and build up your endurance at least once a week.  Weight bearing exercise is also recommended twice weekly.  Eat a healthy diet with lots of vegetables, fruits and fiber.  "Colorful" foods have a lot of vitamins (ie green vegetables, tomatoes, red peppers, etc).  Limit sweet tea, regular sodas and alcoholic beverages, all of which has a lot of calories and sugar.  Up to 1 alcoholic drink daily may be beneficial for women (unless trying to lose weight, watch sugars).  Drink a lot of water.  Calcium recommendations are 1200-1500 mg daily (1500 mg for postmenopausal women or women without ovaries), and vitamin D 1000 IU daily.  This should be obtained from diet and/or supplements (vitamins), and calcium should not be taken all at once, but in divided doses.  Monthly self breast exams and yearly mammograms for women over the age of 77 is recommended.  Sunscreen of at least SPF 30 should be used on all sun-exposed parts of the skin when outside between the hours of 10 am and 4 pm (not just when at beach or pool, but even with exercise, golf, tennis, and yard work!)  Use a sunscreen that says "broad spectrum" so it covers both UVA and UVB rays, and make sure to reapply every 1-2 hours.  Remember to change the batteries in your smoke detectors when changing your clock times in the spring and fall.  Use your seat belt every time you are in a car, and please drive safely and not be distracted with cell phones and texting while driving.  Schedule mammogram [facility]) Schedule routine dental cleaning and eye exams Consider the flu shot. We will be referring you to gastroenterologist for routine  colonoscopy

## 2013-05-18 NOTE — Telephone Encounter (Signed)
Pt wants refill of Pravastatin to [facility] on [address]  Pt has cpe this afternoon.
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
## 2013-05-19 MED ORDER — PRAVASTATIN SODIUM 40 MG PO TABS: 40.0000 mg | ORAL_TABLET | Freq: Every day | ORAL | Status: DC

## 2013-05-19 NOTE — Addendum Note (Signed)
Addended byJoselyn Arrow on: 05/19/2013 12:11 PM   Modules accepted: Orders

## 2013-05-21 ENCOUNTER — Encounter: Payer: Self-pay | Admitting: Family Medicine

## 2013-06-10 ENCOUNTER — Other Ambulatory Visit (INDEPENDENT_AMBULATORY_CARE_PROVIDER_SITE_OTHER): Payer: BC Managed Care – PPO

## 2013-06-10 ENCOUNTER — Encounter: Payer: Self-pay | Admitting: Family Medicine

## 2013-06-10 DIAGNOSIS — Z0000 Encounter for general adult medical examination without abnormal findings: Secondary | ICD-10-CM

## 2013-06-10 LAB — POC HEMOCCULT BLD/STL (HOME/3-CARD/SCREEN): Fecal Occult Blood, POC: NEGATIVE

## 2013-07-20 ENCOUNTER — Encounter: Payer: Self-pay | Admitting: Family Medicine

## 2014-06-06 IMAGING — CT CT HEAD W/O CM
2 series · 16 of 30 positions shown, 20 images · non-contrast
Comparison: MRI 08/22/2007

CLINICAL DATA: Dizziness

CT HEAD WITHOUT CONTRAST
TECHNIQUE: Contiguous axial images were obtained from the base of
the skull through the vertex without contrast

[Series 2: head w/o · axial · non-contrast · 0.49mm/px · z∈[+92,+212]mm · 13 of 28 slices shown, 17 images]
[im 2/28  brain]
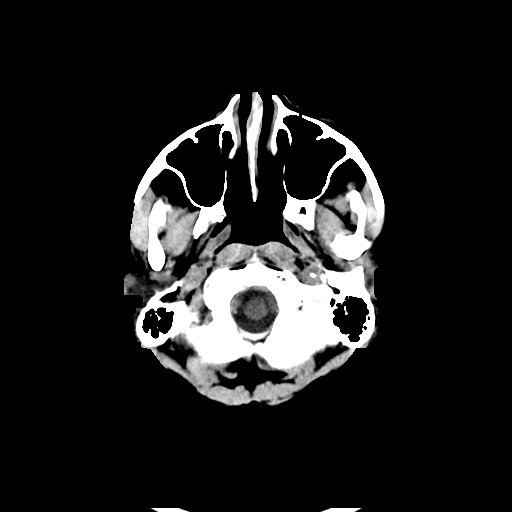
[im 2/28  bone]
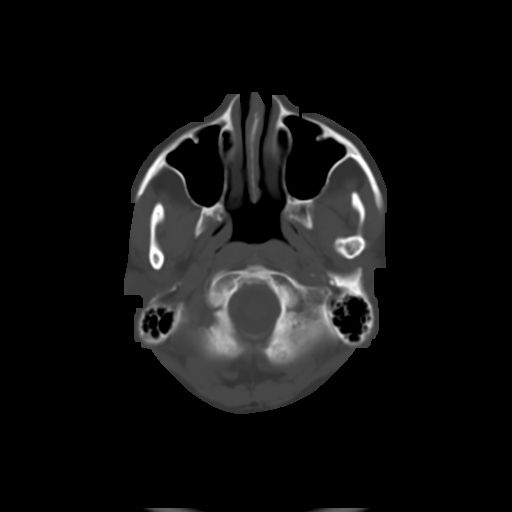
[im 4/28  brain]
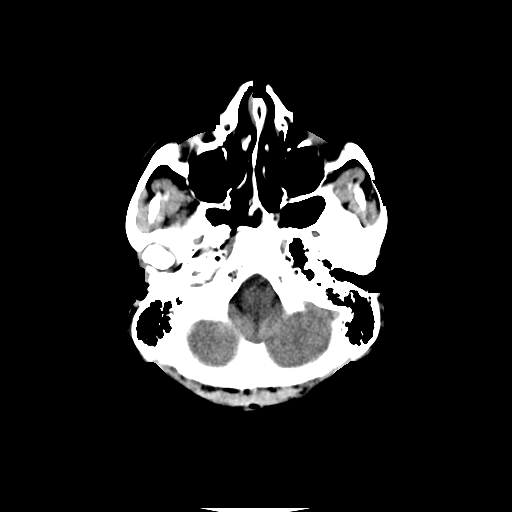
[im 6/28  brain]
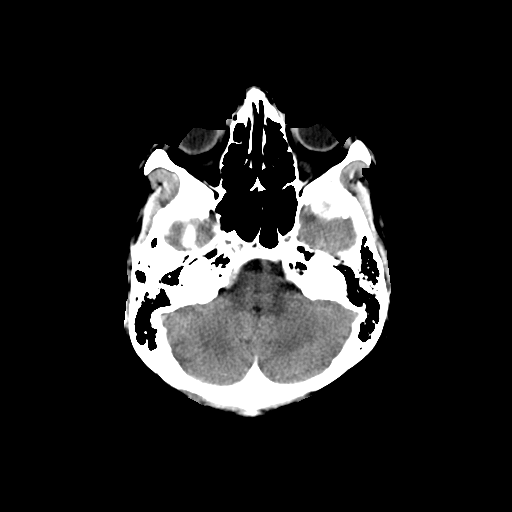
[im 8/28  brain]
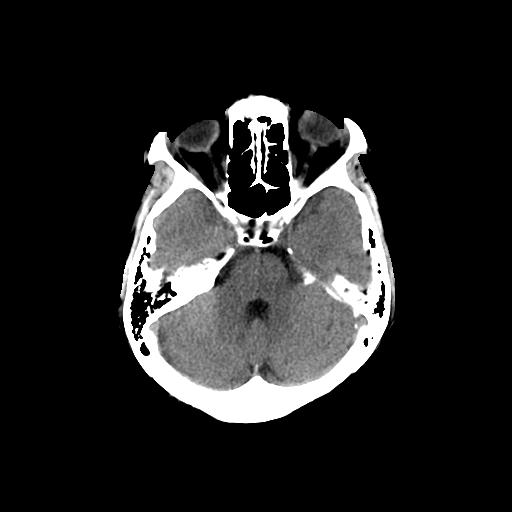
[im 10/28  brain]
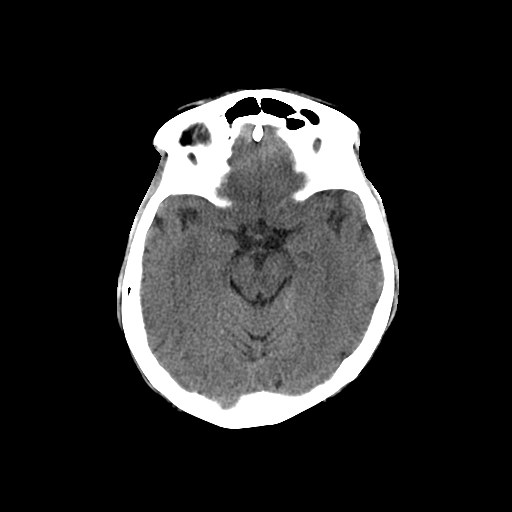
[im 10/28  bone]
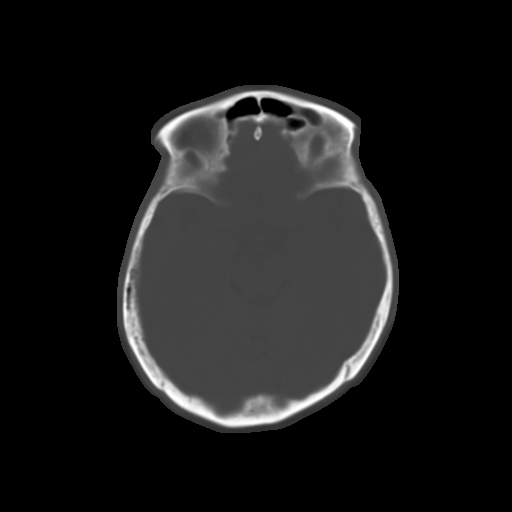
[im 12/28  brain]
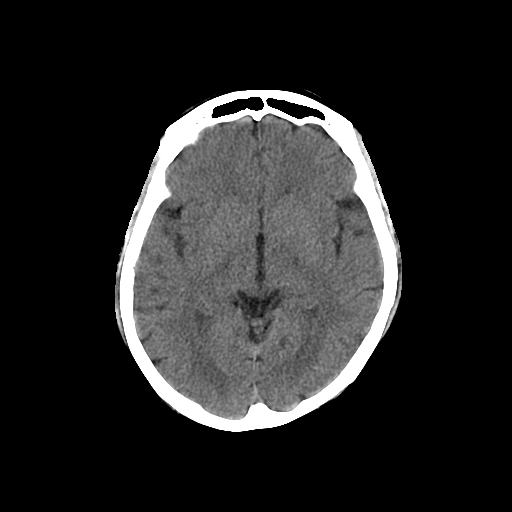
[im 14/28  brain]
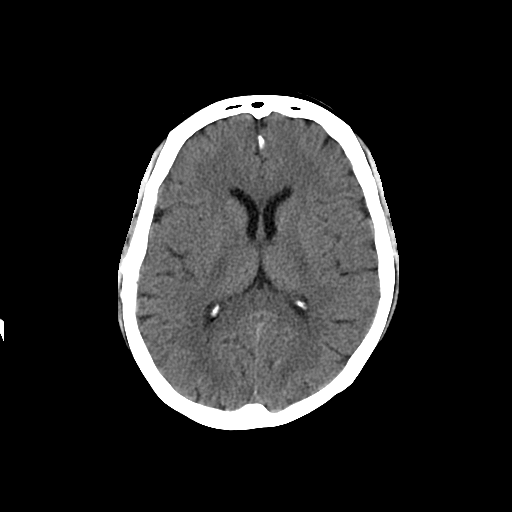
[im 16/28  brain]
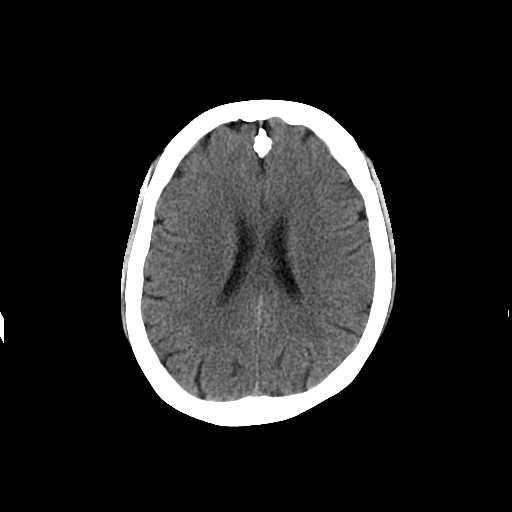
[im 18/28  brain]
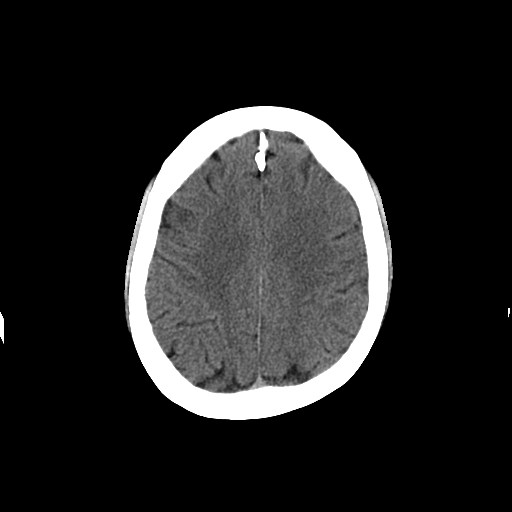
[im 18/28  bone]
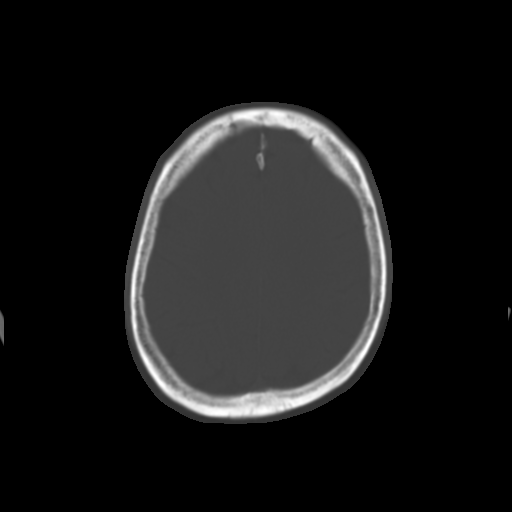
[im 20/28  brain]
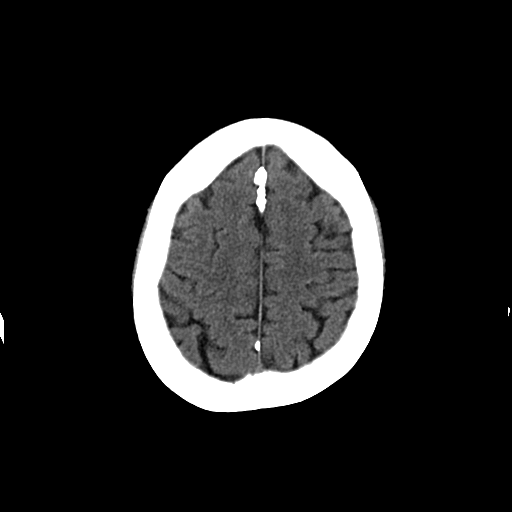
[im 22/28  brain]
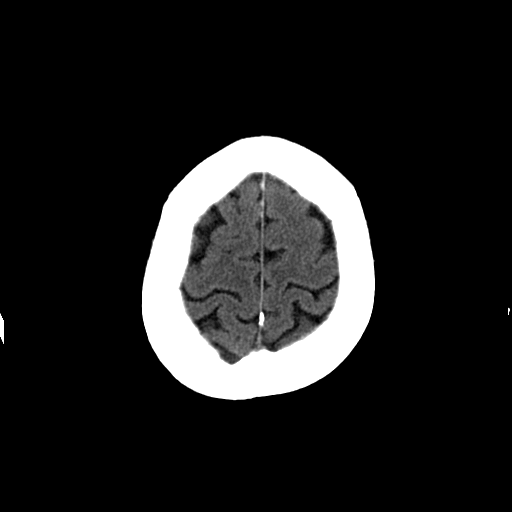
[im 24/28  brain]
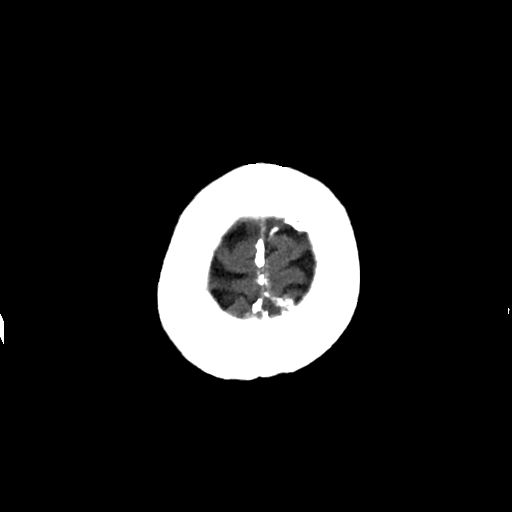
[im 26/28  brain]
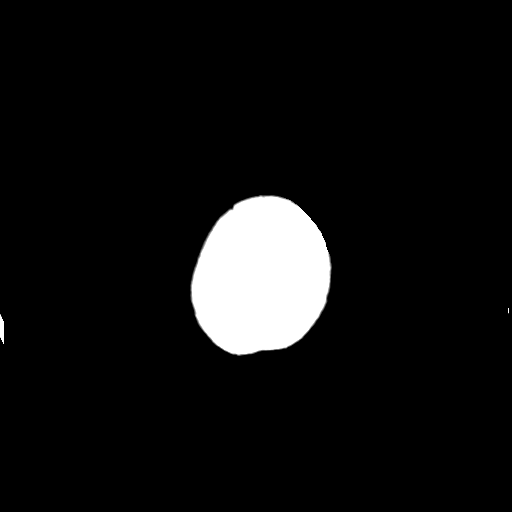
[im 26/28  bone]
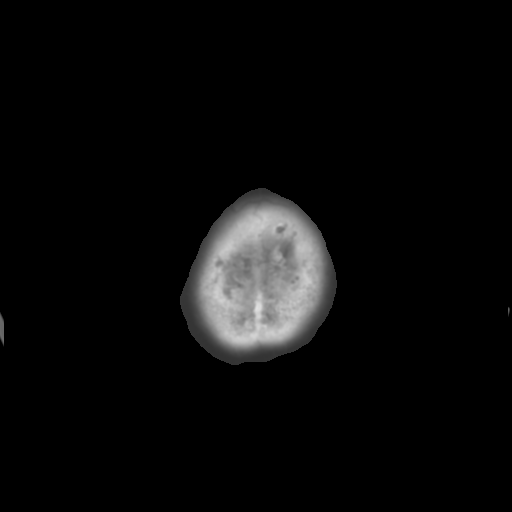

[Series 3: head w/o bone · axial · non-contrast · 0.49mm/px · z∈[+92,+132]mm · 3 of 28 slices shown]
[im 2/28  bone]
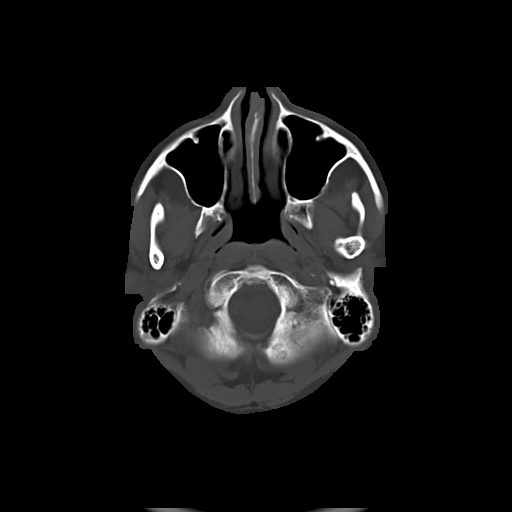
[im 6/28  bone]
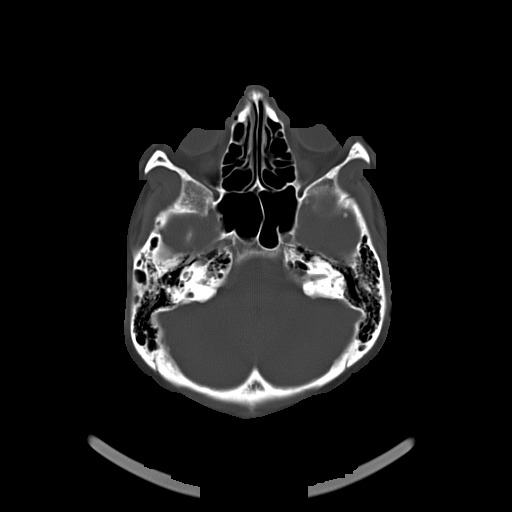
[im 10/28  bone]
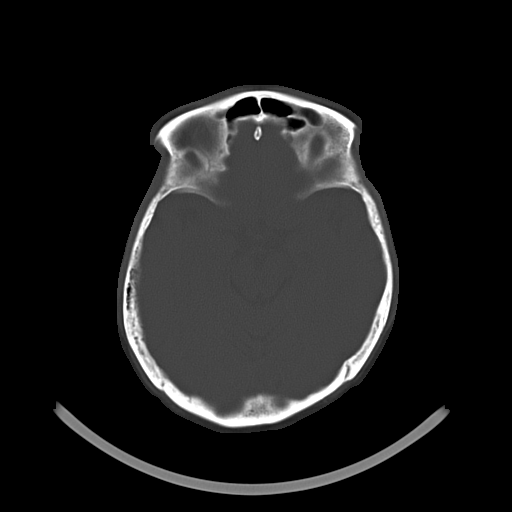

[16 of 30 positions shown; findings below may reference images not displayed]

FINDINGS: The brain has a normal appearance without evidence for
hemorrhage, acute infarction, hydrocephalus, or mass lesion.  There
is no extra axial fluid collection.  The skull and paranasal
sinuses are normal.
IMPRESSION: Normal CT of the head without contrast.

## 2014-06-21 ENCOUNTER — Encounter: Payer: Self-pay | Admitting: Family Medicine

## 2015-10-29 ENCOUNTER — Encounter (HOSPITAL_COMMUNITY): Payer: Self-pay

## 2015-10-29 ENCOUNTER — Emergency Department (HOSPITAL_COMMUNITY): Payer: Self-pay

## 2015-10-29 ENCOUNTER — Emergency Department (HOSPITAL_COMMUNITY)
Admission: EM | Admit: 2015-10-29 | Discharge: 2015-10-29 | Disposition: A | Discharge status: Home / Self Care | Payer: Self-pay | Attending: Emergency Medicine | Admitting: Emergency Medicine

## 2015-10-29 DIAGNOSIS — G8929 Other chronic pain: Secondary | ICD-10-CM | POA: Insufficient documentation

## 2015-10-29 DIAGNOSIS — I159 Secondary hypertension, unspecified: Secondary | ICD-10-CM | POA: Insufficient documentation

## 2015-10-29 DIAGNOSIS — M545 Low back pain, unspecified: Secondary | ICD-10-CM

## 2015-10-29 DIAGNOSIS — E785 Hyperlipidemia, unspecified: Secondary | ICD-10-CM | POA: Insufficient documentation

## 2015-10-29 DIAGNOSIS — R2689 Other abnormalities of gait and mobility: Secondary | ICD-10-CM | POA: Insufficient documentation

## 2015-10-29 DIAGNOSIS — Z79899 Other long term (current) drug therapy: Secondary | ICD-10-CM | POA: Insufficient documentation

## 2015-10-29 DIAGNOSIS — Z87891 Personal history of nicotine dependence: Secondary | ICD-10-CM | POA: Insufficient documentation

## 2015-10-29 MED ORDER — KETOROLAC TROMETHAMINE 60 MG/2ML IM SOLN
60.0000 mg | Freq: Once | INTRAMUSCULAR | Status: AC
  Administered 2015-10-29: 60 mg via INTRAMUSCULAR
  Filled 2015-10-29: qty 2

## 2015-10-29 MED ORDER — NAPROXEN 500 MG PO TABS: 500.0000 mg | ORAL_TABLET | Freq: Two times a day (BID) | ORAL | Status: DC

## 2015-10-29 MED ORDER — DIAZEPAM 5 MG PO TABS
5.0000 mg | ORAL_TABLET | Freq: Once | ORAL | Status: AC
  Administered 2015-10-29: 5 mg via ORAL
  Filled 2015-10-29: qty 1

## 2015-10-29 MED ORDER — HYDROCODONE-ACETAMINOPHEN 5-325 MG PO TABS: 2.0000 | ORAL_TABLET | ORAL | Status: DC | PRN

## 2015-10-29 MED ORDER — METHOCARBAMOL 500 MG PO TABS
500.0000 mg | ORAL_TABLET | Freq: Two times a day (BID) | ORAL | Status: DC
Start: 2015-10-29 — End: 2017-10-02

## 2015-10-29 MED ORDER — CYCLOBENZAPRINE HCL 10 MG PO TABS
5.0000 mg | ORAL_TABLET | Freq: Once | ORAL | Status: AC
  Administered 2015-10-29: 5 mg via ORAL
  Filled 2015-10-29: qty 1

## 2015-10-29 MED ORDER — LIDOCAINE 5 % EX PTCH
1.0000 | MEDICATED_PATCH | Freq: Once | CUTANEOUS | Status: DC
  Administered 2015-10-29: 1 via TRANSDERMAL
  Filled 2015-10-29 (×4): qty 1
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
## 2015-10-29 NOTE — ED Notes (Signed)
Pt. 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  When she raised back up she felt something tear in her lower back.  Pain is severe.  Denies any numbness opr tingling.  She does have numbness in her hands. And also has cramping in both legs. Pt. Is tearful in triage

## 2015-10-29 NOTE — ED Notes (Signed)
Patient transported to X-ray 

## 2015-10-29 NOTE — Discharge Instructions (Signed)
Back Pain, Adult Your follow-up appointment with your PCP next week. Back pain is very common in adults.The cause of back pain is rarely dangerous and the pain often gets better over time.The cause of your back pain may not be known. Some common causes of back pain include:  Strain of the muscles or ligaments supporting the spine.  Wear and tear (degeneration) of the spinal disks.  Arthritis.  Direct injury to the back. For many people, back pain may return. Since back pain is rarely dangerous, most people can learn to manage this condition on their own. HOME CARE INSTRUCTIONS Watch your back pain for any changes. The following actions may help to lessen any discomfort you are feeling:  Remain active. It is stressful on your back to sit or stand in one place for long periods of time. Do not sit, drive, or stand in one place for more than 30 minutes at a time. Take short walks on even surfaces as soon as you are able.Try to increase the length of time you walk each day.  Exercise regularly as directed by your health care provider. Exercise helps your back heal faster. It also helps avoid future injury by keeping your muscles strong and flexible.  Do not stay in bed.Resting more than 1-2 days can delay your recovery.  Pay attention to your body when you bend and lift. The most comfortable positions are those that put less stress on your recovering back. Always use proper lifting techniques, including:  Bending your knees.  Keeping the load close to your body.  Avoiding twisting.  Find a comfortable position to sleep. Use a firm mattress and lie on your side with your knees slightly bent. If you lie on your back, put a pillow under your knees.  Avoid feeling anxious or stressed.Stress increases muscle tension and can worsen back pain.It is important to recognize when you are anxious or stressed and learn ways to manage it, such as with exercise.  Take medicines only as directed by  your health care provider. Over-the-counter medicines to reduce pain and inflammation are often the most helpful.Your health care provider may prescribe muscle relaxant drugs.These medicines help dull your pain so you can more quickly return to your normal activities and healthy exercise.  Apply ice to the injured area:  Put ice in a plastic bag.  Place a towel between your skin and the bag.  Leave the ice on for 20 minutes, 2-3 times a day for the first 2-3 days. After that, ice and heat may be alternated to reduce pain and spasms.  Maintain a healthy weight. Excess weight puts extra stress on your back and makes it difficult to maintain good posture. SEEK MEDICAL CARE IF:  You have pain that is not relieved with rest or medicine.  You have increasing pain going down into the legs or buttocks.  You have pain that does not improve in one week.  You have night pain.  You lose weight.  You have a fever or chills. SEEK IMMEDIATE MEDICAL CARE IF:   You develop new bowel or bladder control problems. Hypertension Hypertension, commonly called high blood pressure, is when the force of blood pumping through your arteries is too strong. Your arteries are the blood vessels that carry blood from your heart throughout your body. A blood pressure reading consists of a higher number over a lower number, such as 110/72. The higher number (systolic) is the pressure inside your arteries when your heart pumps. The lower  number (diastolic) is the pressure inside your arteries when your heart relaxes. Ideally you want your blood pressure below 120/80. Hypertension forces your heart to work harder to pump blood. Your arteries may become narrow or stiff. Having untreated or uncontrolled hypertension can cause heart attack, stroke, kidney disease, and other problems. RISK FACTORS Some risk factors for high blood pressure are controllable. Others are not.  Risk factors you cannot control include:  Race.  You may be at higher risk if you are African American. Age. Risk increases with age. Gender. Men are at higher risk than women before age 1 years. After age 10, women are at higher risk than men. Risk factors you can control include: Not getting enough exercise or physical activity. Being overweight. Getting too much fat, sugar, calories, or salt in your diet. Drinking too much alcohol. SIGNS AND SYMPTOMS Hypertension does not usually cause signs or symptoms. Extremely high blood pressure (hypertensive crisis) may cause headache, anxiety, shortness of breath, and nosebleed. DIAGNOSIS To check if you have hypertension, your health care provider will measure your blood pressure while you are seated, with your arm held at the level of your heart. It should be measured at least twice using the same arm. Certain conditions can cause a difference in blood pressure between your right and left arms. A blood pressure reading that is higher than normal on one occasion does not mean that you need treatment. If it is not clear whether you have high blood pressure, you may be asked to return on a different day to have your blood pressure checked again. Or, you may be asked to monitor your blood pressure at home for 1 or more weeks. TREATMENT Treating high blood pressure includes making lifestyle changes and possibly taking medicine. Living a healthy lifestyle can help lower high blood pressure. You may need to change some of your habits. Lifestyle changes may include: Following the DASH diet. This diet is high in fruits, vegetables, and whole grains. It is low in salt, red meat, and added sugars. Keep your sodium intake below 2,300 mg per day. Getting at least 30-45 minutes of aerobic exercise at least 4 times per week. Losing weight if necessary. Not smoking. Limiting alcoholic beverages. Learning ways to reduce stress. Your health care provider may prescribe medicine if lifestyle changes are not enough  to get your blood pressure under control, and if one of the following is true: You are 24-57 years of age and your systolic blood pressure is above 140. You are 62 years of age or older, and your systolic blood pressure is above 150. Your diastolic blood pressure is above 90. You have diabetes, and your systolic blood pressure is over [id number] or your diastolic blood pressure is over 90. You have kidney disease and your blood pressure is above 140/90. You have heart disease and your blood pressure is above 140/90. Your personal target blood pressure may vary depending on your medical conditions, your age, and other factors. HOME CARE INSTRUCTIONS Have your blood pressure rechecked as directed by your health care provider.  Take medicines only as directed by your health care provider. Follow the directions carefully. Blood pressure medicines must be taken as prescribed. The medicine does not work as well when you skip doses. Skipping doses also puts you at risk for problems. Do not smoke.  Monitor your blood pressure at home as directed by your health care provider. SEEK MEDICAL CARE IF:  You think you are having a reaction to medicines  taken. You have recurrent headaches or feel dizzy. You have swelling in your ankles. You have trouble with your vision. SEEK IMMEDIATE MEDICAL CARE IF: You develop a severe headache or confusion. You have unusual weakness, numbness, or feel faint. You have severe chest or abdominal pain. You vomit repeatedly. You have trouble breathing. MAKE SURE YOU:  Understand these instructions. Will watch your condition. Will get help right away if you are not doing well or get worse.   This information is not intended to replace advice given to you by your health care provider. Make sure you discuss any questions you have with your health care provider.   Document Released: 08/06/2005 Document Revised: 12/21/2014 Document Reviewed: 05/29/2013 Elsevier Interactive  Patient Education 2016 [place] have unusual weakness or numbness in your arms or legs.  You develop nausea or vomiting.  You develop abdominal pain.  You feel faint.   This information is not intended to replace advice given to you by your health care provider. Make sure you discuss any questions you have with your health care provider.   Document Released: 08/06/2005 Document Revised: 08/27/2014 Document Reviewed: 12/08/2013 Elsevier Interactive Patient Education [employer].

## 2016-11-19 ENCOUNTER — Emergency Department (HOSPITAL_COMMUNITY)
Admission: EM | Admit: 2016-11-19 | Discharge: 2016-11-20 | Disposition: A | Discharge status: Home / Self Care | Payer: Self-pay | Attending: Emergency Medicine | Admitting: Emergency Medicine

## 2016-11-19 ENCOUNTER — Emergency Department (HOSPITAL_COMMUNITY): Payer: Self-pay

## 2016-11-19 ENCOUNTER — Encounter (HOSPITAL_COMMUNITY): Payer: Self-pay

## 2016-11-19 DIAGNOSIS — Z79899 Other long term (current) drug therapy: Secondary | ICD-10-CM | POA: Insufficient documentation

## 2016-11-19 DIAGNOSIS — I10 Essential (primary) hypertension: Secondary | ICD-10-CM | POA: Insufficient documentation

## 2016-11-19 DIAGNOSIS — R0981 Nasal congestion: Secondary | ICD-10-CM | POA: Insufficient documentation

## 2016-11-19 DIAGNOSIS — Z87891 Personal history of nicotine dependence: Secondary | ICD-10-CM | POA: Insufficient documentation

## 2016-11-19 DIAGNOSIS — J069 Acute upper respiratory infection, unspecified: Secondary | ICD-10-CM | POA: Insufficient documentation

## 2016-11-19 LAB — COMPREHENSIVE METABOLIC PANEL
ALT: 13 U/L — ABNORMAL LOW (ref 14–54)
AST: 34 U/L (ref 15–41)
Albumin: 4.1 g/dL (ref 3.5–5.0)
Alkaline Phosphatase: 68 U/L (ref 38–126)
Anion gap: 11 (ref 5–15)
BUN: 13 mg/dL (ref 6–20)
CHLORIDE: 103 mmol/L (ref 101–111)
CO2: 24 mmol/L (ref 22–32)
Calcium: 8.9 mg/dL (ref 8.9–10.3)
Creatinine, Ser: 0.65 mg/dL (ref 0.44–1.00)
Glucose, Bld: 99 mg/dL (ref 65–99)
POTASSIUM: 3.7 mmol/L (ref 3.5–5.1)
Sodium: 138 mmol/L (ref 135–145)
TOTAL PROTEIN: 7.9 g/dL (ref 6.5–8.1)
Total Bilirubin: 0.6 mg/dL (ref 0.3–1.2)

## 2016-11-19 LAB — CBC WITH DIFFERENTIAL/PLATELET
BLASTS: 0 %
Band Neutrophils: 0 %
Basophils Absolute: 0 10*3/uL (ref 0.0–0.1)
Basophils Relative: 0 %
EOS PCT: 1 %
Eosinophils Absolute: 0 10*3/uL (ref 0.0–0.7)
HEMATOCRIT: 39.1 % (ref 36.0–46.0)
Hemoglobin: 12.3 g/dL (ref 12.0–15.0)
LYMPHS ABS: 1.2 10*3/uL (ref 0.7–4.0)
Lymphocytes Relative: 40 %
MCH: 29.2 pg (ref 26.0–34.0)
MCHC: 31.5 g/dL (ref 30.0–36.0)
MCV: 92.9 fL (ref 78.0–100.0)
MONOS PCT: 4 %
Metamyelocytes Relative: 0 %
Monocytes Absolute: 0.1 10*3/uL (ref 0.1–1.0)
Myelocytes: 0 %
NEUTROS PCT: 55 %
NRBC: 0 /100{WBCs}
Neutro Abs: 1.7 10*3/uL (ref 1.7–7.7)
Other: 0 %
PLATELETS: 205 10*3/uL (ref 150–400)
Promyelocytes Absolute: 0 %
RBC: 4.21 MIL/uL (ref 3.87–5.11)
RDW: 13.7 % (ref 11.5–15.5)
WBC: 3 10*3/uL — AB (ref 4.0–10.5)

## 2016-11-19 MED ORDER — ALBUTEROL SULFATE (2.5 MG/3ML) 0.083% IN NEBU
5.0000 mg | INHALATION_SOLUTION | Freq: Once | RESPIRATORY_TRACT | Status: AC
  Administered 2016-11-19: 5 mg via RESPIRATORY_TRACT
  Filled 2016-11-19: qty 6

## 2016-11-19 NOTE — ED Triage Notes (Signed)
Pt states she has had a persistent cough with some blood tinged sputum that began last Tuesday. Pt also reports some soreness in her throat. Voice is noted to be hoarse, airway intact. Pt also reports some nausea.
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
## 2016-11-20 MED ORDER — PREDNISONE 50 MG PO TABS: 50.0000 mg | ORAL_TABLET | Freq: Every day | ORAL | 0 refills | Status: DC

## 2016-11-20 MED ORDER — ALBUTEROL SULFATE HFA 108 (90 BASE) MCG/ACT IN AERS
2.0000 | INHALATION_SPRAY | Freq: Once | RESPIRATORY_TRACT | Status: AC
  Administered 2016-11-20: 2 via RESPIRATORY_TRACT
  Filled 2016-11-20: qty 6.7

## 2016-11-20 MED ORDER — ALBUTEROL SULFATE HFA 108 (90 BASE) MCG/ACT IN AERS: 1.0000 | INHALATION_SPRAY | Freq: Four times a day (QID) | RESPIRATORY_TRACT | 0 refills | Status: DC | PRN

## 2016-11-20 MED ORDER — PROMETHAZINE-DM 6.25-15 MG/5ML PO SYRP: 5.0000 mL | ORAL_SOLUTION | Freq: Four times a day (QID) | ORAL | 0 refills | Status: DC | PRN

## 2016-11-20 MED ORDER — AEROCHAMBER PLUS FLO-VU LARGE MISC
1.0000 | Freq: Once | Status: AC
  Administered 2016-11-20: 1

## 2016-11-20 MED ORDER — GUAIFENESIN ER 1200 MG PO TB12: 1.0000 | ORAL_TABLET | Freq: Two times a day (BID) | ORAL | 0 refills | Status: DC

## 2016-11-20 NOTE — Discharge Instructions (Signed)
Return here as needed.  Follow-up with your primary care doctor.  Increase your fluid intake and rest as much as possible °

## 2017-10-02 ENCOUNTER — Encounter: Payer: Self-pay | Admitting: Family Medicine

## 2017-10-02 ENCOUNTER — Ambulatory Visit (INDEPENDENT_AMBULATORY_CARE_PROVIDER_SITE_OTHER): Payer: Self-pay | Admitting: Family Medicine

## 2017-10-02 VITALS — BP 164/94 | HR 79 | Temp 98.0°F | Resp 14 | Ht 60.0 in | Wt 185.0 lb

## 2017-10-02 DIAGNOSIS — E785 Hyperlipidemia, unspecified: Secondary | ICD-10-CM

## 2017-10-02 DIAGNOSIS — Z131 Encounter for screening for diabetes mellitus: Secondary | ICD-10-CM | POA: Insufficient documentation

## 2017-10-02 DIAGNOSIS — Z1159 Encounter for screening for other viral diseases: Secondary | ICD-10-CM

## 2017-10-02 DIAGNOSIS — I10 Essential (primary) hypertension: Secondary | ICD-10-CM

## 2017-10-02 DIAGNOSIS — N390 Urinary tract infection, site not specified: Secondary | ICD-10-CM

## 2017-10-02 DIAGNOSIS — E559 Vitamin D deficiency, unspecified: Secondary | ICD-10-CM

## 2017-10-02 LAB — POCT URINALYSIS DIP (DEVICE)
Bilirubin Urine: NEGATIVE
Glucose, UA: NEGATIVE mg/dL
HGB URINE DIPSTICK: NEGATIVE
KETONES UR: NEGATIVE mg/dL
Nitrite: POSITIVE — AB
PH: 5.5 (ref 5.0–8.0)
PROTEIN: NEGATIVE mg/dL
Specific Gravity, Urine: 1.02 (ref 1.005–1.030)
Urobilinogen, UA: 0.2 mg/dL (ref 0.0–1.0)

## 2017-10-02 LAB — POCT GLYCOSYLATED HEMOGLOBIN (HGB A1C): HEMOGLOBIN A1C: 5.2

## 2017-10-02 MED ORDER — CLONIDINE HCL 0.1 MG PO TABS
0.1000 mg | ORAL_TABLET | Freq: Once | ORAL | Status: AC
  Administered 2017-10-02: 0.1 mg via ORAL

## 2017-10-02 MED ORDER — AMLODIPINE BESYLATE 5 MG PO TABS: 5.0000 mg | ORAL_TABLET | Freq: Every day | ORAL | 5 refills | Status: DC

## 2017-10-02 MED ORDER — SULFAMETHOXAZOLE-TRIMETHOPRIM 800-160 MG PO TABS: 1.0000 | ORAL_TABLET | Freq: Two times a day (BID) | ORAL | 0 refills | Status: AC

## 2017-10-02 MED FILL — ?AMLODIPINE BESYLATE 5 MG T: 5 MG | 30 days supply | Qty: 30 | Fill #0

## 2017-10-02 MED FILL — SULFAMETHOXAZOLE-TMP DS TAB: 800-160 | 7 days supply | Qty: 14 | Fill #0

## 2017-10-02 NOTE — Patient Instructions (Addendum)
Your blood pressure is above goal. Will start a trial of amlodipine 5 mg daily.  Continue medication, monitor blood pressure at home. Continue DASH diet. Reminder to go to the ER if any CP, SOB, nausea, dizziness, severe HA, changes vision/speech, left arm numbness and tingling and jaw pain.  Your have an abnormal urinalysis, will treat empirically with antibiotic. Will start Bactrim 800-160 mg every 12 hours for 7 days.   Rolena Infante [phone] to schedule routine screening mammogram   Will follow up by phone with any abnormal laboratory results

## 2017-10-02 NOTE — Progress Notes (Signed)
Subjective:    Patient ID: Kaylee [name], female    DOB: [birth year], 62 y.o.   MRN: [id number]  HPI  Kaylee [name], a 62 year old female who presents to establish care. 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  She reports a history of hypertension. She has been without medications. She has blood pressure checked often at her job. Lately blood pressures have been elevated. She does not exercise routinely or follow a low sodium diet. Patient denies chest pain, claudication, dyspnea, fatigue, irregular heart beat, orthopnea, palpitations and tachypnea.  Cardiovascular risk factors: obesity (BMI >= 30 kg/m2) and sedentary lifestyle. Patient also reports a history of high cholesterol.    Past Medical History:  Diagnosis Date  . Bell's palsy age 70   residual right sided weakness  . 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 Hyperlipidemia   . Hypertension age 47  . Seasonal allergies    Social History   Socioeconomic History  . Marital status: Single    Spouse name: Not on file  . Number of children: 4  . Years of education: Not on file  . Highest education level: Not on file  Social Needs  . Financial resource strain: Not on file  . Food insecurity - worry: Not on file  . Food insecurity - inability: Not on file  . Transportation needs - medical: Not on file  . Transportation needs - non-medical: Not on file  Occupational History  . Occupation: Training and development officer at [employer]    Employer: [employer]  Tobacco Use  . Smoking status: Former Smoker    Types: Cigarettes    Last attempt to quit: 07/20/1982    Years since quitting: 35.2  . Smokeless tobacco: Never Used  Substance and Sexual Activity  . Alcohol use: Yes    Comment: socially  . Drug use: Yes    Types: Marijuana    Comment: occ  . Sexual activity: Not Currently    Partners: Male   Other Topics Concern  . Not on file  Social History Narrative   Lives with youngest son.  Son in [place] and 2 daughter are in [place].  No pets   Immunization History  Administered Date(s) Administered  . Tdap 12/29/2012   Review of Systems  Constitutional: Negative.   HENT: Negative.   Eyes: Negative for photophobia, redness and visual disturbance.  Respiratory: Negative.   Cardiovascular: Negative.   Gastrointestinal: Negative.   Endocrine: Negative for polydipsia, polyphagia and polyuria.  Genitourinary: Negative for dysuria, frequency and pelvic pain.  Musculoskeletal: Negative.   Skin: Negative.   Neurological: Negative.   Hematological: Negative.        Objective:   Physical Exam  Constitutional: She is oriented to person, place, and time.  HENT:  Head: Normocephalic and atraumatic.  Right Ear: External ear normal.  Left Ear: External ear normal.  Nose: Nose normal.  Mouth/Throat: Oropharynx is clear and moist.  Eyes: Pupils are equal, round, and reactive to light.  Neck: Normal range of motion. Neck supple.  Pulmonary/Chest: Effort normal and breath sounds normal.  Abdominal: Soft. Bowel sounds are normal.  Neurological: She is alert and oriented to person, place, and time. She has normal reflexes.  Skin: Skin is warm and dry.  Psychiatric: She has a normal mood and affect. Her behavior is  normal. Judgment and thought content normal.       BP (!) 164/94   Pulse 79   Temp 98 F (36.7 C) (Oral)   Resp 14   Ht 5' (1.524 m)   Wt 185 lb (83.9 kg)   SpO2 99%   BMI 36.13 kg/m  Assessment & Plan:  1. Essential hypertension Blood pressure elevated at 170/98 on arrival. Administered Clonidine 0.1 mg times one, decreased to 164/94. Will start a trial of Amlodipine 5 mg daily. Return to clinic in 1 week for bp check.  Will review renal functioning as results become available.  Start DASH diet. Reminder to go to the ER if any CP, SOB, nausea, dizziness, severe  HA, changes vision/speech, left arm numbness and tingling and jaw pain.    - POCT urinalysis dip (device) - cloNIDine (CATAPRES) tablet 0.1 mg - Comprehensive metabolic panel - amLODipine (NORVASC) 5 MG tablet; Take 1 tablet (5 mg total) by mouth daily.  Dispense: 30 tablet; Refill: 5 - TSH  2. Diabetes mellitus screening - HgB A1c  3. Vitamin D deficiency Will review vitamin D level as results become available  4. Hyperlipidemia, unspecified hyperlipidemia type - Lipid Panel; Future  5. Need for hepatitis C screening test - Hepatitis C Antibody  6. Urinary tract infection without hematuria, site unspecified Reviewed urinalysis, positive nitrites. Will treat empirically for urinary tract infection. Will follow culture.  - sulfamethoxazole-trimethoprim (BACTRIM DS,SEPTRA DS) 800-160 MG tablet; Take 1 tablet by mouth 2 (two) times daily for 7 days.  Dispense: 14 tablet; Refill: 0 - Urine Culture   RTC: 1 week for bp check and 3 months for hypertension.   The patient was given clear instructions to go to ER or return to medical center if symptoms do not improve, worsen or new problems develop. The patient verbalized understanding. Will notify patient with laboratory results.   [name]  MSN, FNP-C [facility] [address] [place], [place] [address] [phone]

## 2017-10-03 LAB — COMPREHENSIVE METABOLIC PANEL
ALBUMIN: 4.7 g/dL (ref 3.6–4.8)
ALK PHOS: 76 IU/L (ref 39–117)
ALT: 11 IU/L (ref 0–32)
AST: 23 IU/L (ref 0–40)
Albumin/Globulin Ratio: 1.5 (ref 1.2–2.2)
BUN / CREAT RATIO: 21 (ref 12–28)
BUN: 12 mg/dL (ref 8–27)
Bilirubin Total: 0.3 mg/dL (ref 0.0–1.2)
CO2: 21 mmol/L (ref 20–29)
CREATININE: 0.56 mg/dL — AB (ref 0.57–1.00)
Calcium: 9.5 mg/dL (ref 8.7–10.3)
Chloride: 104 mmol/L (ref 96–106)
GFR calc Af Amer: 117 mL/min/{1.73_m2} (ref >59)
GFR calc non Af Amer: 102 mL/min/{1.73_m2} (ref >59)
GLOBULIN, TOTAL: 3.1 g/dL (ref 1.5–4.5)
GLUCOSE: 94 mg/dL (ref 65–99)
Potassium: 4.2 mmol/L (ref 3.5–5.2)
SODIUM: 141 mmol/L (ref 134–144)
TOTAL PROTEIN: 7.8 g/dL (ref 6.0–8.5)

## 2017-10-03 LAB — HEPATITIS C ANTIBODY: Hep C Virus Ab: <0.1 s/co ratio (ref 0.0–0.9)

## 2017-10-03 LAB — TSH: TSH: 1.42 u[IU]/mL (ref 0.450–4.500)

## 2017-10-04 ENCOUNTER — Telehealth: Payer: Self-pay

## 2017-10-04 ENCOUNTER — Other Ambulatory Visit: Payer: Self-pay | Admitting: Family Medicine

## 2017-10-04 NOTE — Telephone Encounter (Signed)
-----   Message from Dorena Dew, [place] sent at 10/04/2017  4:09 PM EST ----- Regarding: lab results Please inform patient that urine culture positive for E. coli.  Complete Bactrim as prescribed.  Increase water intake, wipe from front to back, and practice good perianal hygiene.   Thanks

## 2017-10-04 NOTE — Telephone Encounter (Signed)
Called and spoke with patient, advised that urine was positive for E coli. Advised that she needs to complete bactrim as prescribed, asked to increase water intake, and use good perianal hygiene by wiping form front to back. Patient verbalized understanding. Thanks!

## 2017-10-04 NOTE — Telephone Encounter (Signed)
Called and spoke with patient, advised that labs were normal. Reminded patient to pick up and start amlodipine 5mg  as directed and to keep next scheduled blood pressure check. Patient verbalized understanding. Thanks!

## 2017-10-04 NOTE — Telephone Encounter (Signed)
-----   Message from Dorena Dew, [place] sent at 10/03/2017  5:16 PM EST ----- Regarding: lab results Please inform patient that laboratory results are within a normal range. Ensure that patient was able to pick up amlodipine 5 mg. Remind her to follow up for blood pressure check as scheduled.   Thanks

## 2017-10-05 ENCOUNTER — Encounter: Payer: Self-pay | Admitting: Family Medicine

## 2017-10-05 LAB — URINE CULTURE

## 2017-10-09 ENCOUNTER — Ambulatory Visit (INDEPENDENT_AMBULATORY_CARE_PROVIDER_SITE_OTHER): Payer: Self-pay | Admitting: Family Medicine

## 2017-10-09 VITALS — BP 136/86

## 2017-10-09 DIAGNOSIS — I10 Essential (primary) hypertension: Secondary | ICD-10-CM

## 2017-10-09 NOTE — Progress Notes (Signed)
Patient came in for bp check today. Manually was 138/86. I have advised patient to continue medications as directed and keep next scheduled appointment. Thanks!

## 2017-10-30 ENCOUNTER — Ambulatory Visit (HOSPITAL_COMMUNITY)
Admission: EM | Admit: 2017-10-30 | Discharge: 2017-10-30 | Disposition: A | Discharge status: Home / Self Care | Payer: Self-pay | Attending: Family Medicine | Admitting: Family Medicine

## 2017-10-30 ENCOUNTER — Encounter (HOSPITAL_COMMUNITY): Payer: Self-pay | Admitting: Emergency Medicine

## 2017-10-30 ENCOUNTER — Ambulatory Visit: Payer: Self-pay | Admitting: Family Medicine

## 2017-10-30 ENCOUNTER — Other Ambulatory Visit: Payer: Self-pay

## 2017-10-30 DIAGNOSIS — B9789 Other viral agents as the cause of diseases classified elsewhere: Secondary | ICD-10-CM

## 2017-10-30 DIAGNOSIS — H6121 Impacted cerumen, right ear: Secondary | ICD-10-CM

## 2017-10-30 DIAGNOSIS — J069 Acute upper respiratory infection, unspecified: Secondary | ICD-10-CM

## 2017-10-30 MED ORDER — BENZONATATE 100 MG PO CAPS
100.0000 mg | ORAL_CAPSULE | Freq: Two times a day (BID) | ORAL | 0 refills | Status: DC | PRN
Start: 2017-10-30 — End: 2017-12-09

## 2017-10-30 MED ORDER — CHLORHEXIDINE GLUCONATE 0.12 % MT SOLN: 15.0000 mL | Freq: Two times a day (BID) | OROMUCOSAL | 0 refills | Status: DC

## 2017-10-30 NOTE — ED Notes (Signed)
Left message a [facility].  Cortisporin otic susp                                                                                       20ml                                                                                       2 gtts right ear QID x 5 days

## 2017-10-30 NOTE — ED Triage Notes (Addendum)
Pt reports a scratchy throat and congestion 2-3 days ago.  Today she reports new onset diarrhea, chills, and hoarseness.

## 2017-11-06 ENCOUNTER — Telehealth: Payer: Self-pay

## 2017-11-06 DIAGNOSIS — I10 Essential (primary) hypertension: Secondary | ICD-10-CM

## 2017-11-06 MED ORDER — AMLODIPINE BESYLATE 5 MG PO TABS: 5.0000 mg | ORAL_TABLET | Freq: Every day | ORAL | 5 refills | Status: DC

## 2017-11-06 MED FILL — AMLODIPINE BESYLATE 5 MG TA: 5 | 30 days supply | Qty: 30 | Fill #0

## 2017-11-06 NOTE — Telephone Encounter (Signed)
Refills sent into pharmacy. Thanks!  

## 2017-11-08 ENCOUNTER — Encounter: Payer: Self-pay | Admitting: Family Medicine

## 2017-11-08 ENCOUNTER — Ambulatory Visit (INDEPENDENT_AMBULATORY_CARE_PROVIDER_SITE_OTHER): Payer: Self-pay | Admitting: Family Medicine

## 2017-11-08 VITALS — BP 150/90 | HR 78 | Temp 98.0°F | Resp 16 | Ht 60.0 in | Wt 185.0 lb

## 2017-11-08 DIAGNOSIS — R0981 Nasal congestion: Secondary | ICD-10-CM

## 2017-11-08 DIAGNOSIS — R05 Cough: Secondary | ICD-10-CM

## 2017-11-08 DIAGNOSIS — H938X3 Other specified disorders of ear, bilateral: Secondary | ICD-10-CM

## 2017-11-08 DIAGNOSIS — R058 Other specified cough: Secondary | ICD-10-CM

## 2017-11-08 DIAGNOSIS — I10 Essential (primary) hypertension: Secondary | ICD-10-CM

## 2017-11-08 DIAGNOSIS — J069 Acute upper respiratory infection, unspecified: Secondary | ICD-10-CM

## 2017-11-08 MED ORDER — AZITHROMYCIN 250 MG PO TABS: ORAL_TABLET | ORAL | 0 refills | Status: DC

## 2017-11-08 MED ORDER — CETIRIZINE HCL 10 MG PO TABS: 10.0000 mg | ORAL_TABLET | Freq: Every day | ORAL | 11 refills | Status: DC

## 2017-11-08 MED FILL — AZITHROMYCIN 250 MG TABLET: 250 | 5 days supply | Qty: 6 | Fill #0

## 2017-11-08 NOTE — Patient Instructions (Signed)
You have an upper respiratory infection. Will start azithromycin. Take 500 mg on day 1, on days 2-5 take 250 mg.   Will start saline 1 spray to each nare twice daily.  Cetirizine 10 mg at bedtime.   Increase rest, handwashing, and fluid intake.     The patient was given clear instructions to go to ER or return to medical center if symptoms do not improve, worsen or new problems develop. The patient verbalized understanding.

## 2017-11-08 NOTE — Progress Notes (Signed)
Subjective:     Kaylee [name] is a 62 y.o. female with a history of hypertension presents complaining of upper respiratory symptoms for the past 2 weeks.  The patient was evaluated in urgent care on 10/30/2017 and diagnosed with a URI with cough and otitis media.  Patient was unable to afford prescribed eardrops.  She states that symptoms of nasal congestion and productive cough have been worsening since that time.  She is also been taking Tessalon Perles for cough without sustained relief.  She denies fever, myalgias, sore throat or sinus pain at this time.  Patient endorses nasal congestion and ear pressure.   Past Medical History:  Diagnosis Date  . Bell's palsy age 34   residual right sided weakness  . 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 Family history of breast cancer    Daughter is being treated for breast cancer  . Hyperlipidemia   . Hypertension age 5  . Seasonal allergies    Social History   Socioeconomic History  . Marital status: Single    Spouse name: Not on file  . Number of children: 4  . Years of education: Not on file  . Highest education level: Not on file  Occupational History  . Occupation: Training and development officer at [employer]    Employer: [employer]  Social Needs  . Financial resource strain: Somewhat hard  . Food insecurity:    Worry: Not on file    Inability: Not on file  . Transportation needs:    Medical: Not on file    Non-medical: Not on file  Tobacco Use  . Smoking status: Former Smoker    Types: Cigarettes    Last attempt to quit: 07/20/1982    Years since quitting: 35.3  . Smokeless tobacco: Never Used  Substance and Sexual Activity  . Alcohol use: Yes    Comment: socially  . Drug use: Yes    Types: Marijuana    Comment: occ  . Sexual activity: Not Currently    Partners: Male  Lifestyle  . Physical activity:    Days per week: Not on file    Minutes per session: Not on file  . Stress: Not on  file  Relationships  . Social connections:    Talks on phone: Not on file    Gets together: Not on file    Attends religious service: Not on file    Active member of club or organization: Not on file    Attends meetings of clubs or organizations: Not on file    Relationship status: Not on file  . Intimate partner violence:    Fear of current or ex partner: Not on file    Emotionally abused: Not on file    Physically abused: Not on file    Forced sexual activity: Not on file  Other Topics Concern  . Not on file  Social History Narrative   Lives with youngest son.  Son in [place] and 2 daughter are in [place].  No pets. Her daughter is currently being treated for breast cancer   Immunization History  Administered Date(s) Administered  . Tdap 12/29/2012   Review of Systems  Constitutional: Positive for malaise/fatigue. Negative for chills and fever.  HENT: Positive for congestion. Negative for ear discharge, ear pain and sore throat.   Eyes: Negative.   Respiratory: Positive for cough and sputum production.   Cardiovascular: Negative.   Genitourinary: Negative.  Musculoskeletal: Negative.   Skin: Negative.   Neurological: Negative.   Endo/Heme/Allergies: Negative.   Psychiatric/Behavioral: Negative.      Objective:  Physical Exam  Constitutional: She is oriented to person, place, and time. She has a sickly appearance.  HENT:  Right Ear: Tympanic membrane is erythematous.  Left Ear: Tympanic membrane is erythematous.  Nose: Mucosal edema present. Right sinus exhibits no maxillary sinus tenderness and no frontal sinus tenderness. Left sinus exhibits no maxillary sinus tenderness and no frontal sinus tenderness.  Mouth/Throat: Uvula is midline, oropharynx is clear and moist and mucous membranes are normal.  TM dull bilaterally  Cardiovascular: Normal rate, regular rhythm, normal heart sounds and intact distal pulses.  Pulmonary/Chest: Effort normal and breath sounds normal.   Abdominal: Soft. Bowel sounds are normal.  Neurological: She is alert and oriented to person, place, and time.  Skin: Skin is warm and dry.    Assessment:  BP (!) 150/90 (BP Location: Left Arm, Patient Position: Sitting, Cuff Size: Normal)   Pulse 78   Temp 98 F (36.7 C) (Oral)   Resp 16   Ht 5' (1.524 m)   Wt 185 lb (83.9 kg)   SpO2 100%   BMI 36.13 kg/m   Plan:  1. Upper respiratory tract infection, unspecified type Patient warrants antibiotic due to length of upper respiratory symptoms.  Will start azithromycin 500 mg on day 1 and 250 mg on days 2 through 5.  Patient advised to increase rest, handwashing, and fluid intake. - azithromycin (ZITHROMAX) 250 MG tablet; Take 500 mg on day 1. On days 2-5 take 250 mg.  Dispense: 6 tablet; Refill: 0 -Cetirizine 10 mg at bedtime 2. Cough productive of yellow sputum Continue Tessalon Perles as previously prescribed.  Refer to #1  3. Nasal congestion Recommend over-the-counter nasal saline 1 spray to each nare twice daily.  4. Ear pressure, bilateral  Suggested symptomatic OTC remedies. Nasal saline spray for congestion. Follow up as needed.     5.  Essential hypertension Blood pressure is above goal.  Patient did not take antihypertensive medications prior to arrival.  Patient advised to follow-up in 1 month and take medications prior to arrival.    RTC: One month for hypertension

## 2017-12-09 ENCOUNTER — Encounter: Payer: Self-pay | Admitting: Family Medicine

## 2017-12-09 ENCOUNTER — Ambulatory Visit (INDEPENDENT_AMBULATORY_CARE_PROVIDER_SITE_OTHER): Payer: Self-pay | Admitting: Family Medicine

## 2017-12-09 VITALS — BP 140/92 | HR 90 | Temp 98.4°F | Resp 16 | Ht 60.0 in | Wt 186.0 lb

## 2017-12-09 DIAGNOSIS — J3089 Other allergic rhinitis: Secondary | ICD-10-CM

## 2017-12-09 DIAGNOSIS — I10 Essential (primary) hypertension: Secondary | ICD-10-CM

## 2017-12-09 LAB — POCT URINALYSIS DIPSTICK
Bilirubin, UA: NEGATIVE
Glucose, UA: NEGATIVE
Ketones, UA: NEGATIVE
Leukocytes, UA: NEGATIVE
NITRITE UA: NEGATIVE
PROTEIN UA: NEGATIVE
Spec Grav, UA: 1.015 (ref 1.010–1.025)
UROBILINOGEN UA: 0.2 U/dL
pH, UA: 5.5 (ref 5.0–8.0)

## 2017-12-09 NOTE — Progress Notes (Signed)
bat

## 2017-12-09 NOTE — Patient Instructions (Signed)
Will increase amlodipine to 10 mg daily.- Continue medication, monitor blood pressure at home. Continue DASH diet. Reminder to go to the ER if any CP, SOB, nausea, dizziness, severe HA, changes vision/speech, left arm numbness and tingling and jaw pain.   Will also start Pataday drops 1 drop to each eye every morning during allergy season.  Will also add cetirizine 10 mg at bedtime.

## 2017-12-10 MED ORDER — OLOPATADINE HCL 0.2 % OP SOLN: 1.0000 [drp] | Freq: Every day | OPHTHALMIC | 0 refills | Status: DC | PRN

## 2017-12-10 MED ORDER — AMLODIPINE BESYLATE 10 MG PO TABS: 10.0000 mg | ORAL_TABLET | Freq: Every day | ORAL | 5 refills | Status: DC

## 2017-12-10 MED FILL — AMLODIPINE BESYLATE 10 MG T: 10 | 30 days supply | Qty: 30 | Fill #0

## 2017-12-10 MED FILL — OLOPATADINE HCL 0.2% EYE DR: 0.2 | 30 days supply | Qty: 3 | Fill #0

## 2017-12-12 NOTE — Progress Notes (Signed)
Patient ID: Kaylee [name], female   DOB: Jun 07, 1956, 62 y.o.   MRN: [id number] Kaylee [name], a very pleasant 62 year old female presents for hypertension follow up. Patient has been taking antihypertensive medication consistently. She has not been following a low fat, low sodium diet or exercising routinely. Body mass index is 36.33 kg/m. Kaylee [name] does not monitor blood pressure at home.  Patient denies: chest pressure/discomfort, dyspnea, exertional chest pressure/discomfort, fatigue, irregular heart beat and lower extremity edema. Cardiovascular risk factors: obesity (BMI >= 30 kg/m2) and sedentary lifestyle.  Kaylee [name] is also complaining of allergy symptoms over the past week. She endorses eye redness, eye and ear itching, and post nasal drip. She attributes allergy symptoms to increased pollen exposure. She has not attempted OTC interventions to alleviate symptoms.  Past Medical History:  Diagnosis Date  . Bell's palsy age 10   residual right sided weakness  . 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 Family history of breast cancer    Daughter is being treated for breast cancer  . Hyperlipidemia   . Hypertension age 22  . Seasonal allergies    Social History   Socioeconomic History  . Marital status: Single    Spouse name: Not on file  . Number of children: 4  . Years of education: Not on file  . Highest education level: Not on file  Occupational History  . Occupation: Training and development officer at [employer]    Employer: [employer]  Social Needs  . Financial resource strain: Somewhat hard  . Food insecurity:    Worry: Not on file    Inability: Not on file  . Transportation needs:    Medical: Not on file    Non-medical: Not on file  Tobacco Use  . Smoking status: Former Smoker    Types: Cigarettes    Last attempt to quit: 07/20/1982    Years since quitting: 35.4  . Smokeless tobacco: Never Used  Substance and Sexual Activity  .  Alcohol use: Yes    Comment: socially  . Drug use: Yes    Types: Marijuana    Comment: occ  . Sexual activity: Not Currently    Partners: Male  Lifestyle  . Physical activity:    Days per week: Not on file    Minutes per session: Not on file  . Stress: Not on file  Relationships  . Social connections:    Talks on phone: Not on file    Gets together: Not on file    Attends religious service: Not on file    Active member of club or organization: Not on file    Attends meetings of clubs or organizations: Not on file    Relationship status: Not on file  . Intimate partner violence:    Fear of current or ex partner: Not on file    Emotionally abused: Not on file    Physically abused: Not on file    Forced sexual activity: Not on file  Other Topics Concern  . Not on file  Social History Narrative   Lives with youngest son.  Son in [place] and 2 daughter are in [place].  No pets. Her daughter is currently being treated for breast cancer   Immunization History  Administered Date(s) Administered  . Tdap 12/29/2012      Objective:  Physical Exam  Constitutional: She is oriented to person, place, and time. She appears well-developed and well-nourished.  HENT:  Head: Normocephalic.  Nose: Mucosal edema present.  Mouth/Throat: Oropharyngeal exudate (clear exudate) present.  Cardiovascular: Normal rate, regular rhythm, normal heart sounds and intact distal pulses.  Pulmonary/Chest: Effort normal and breath sounds normal.  Abdominal: Soft. Bowel sounds are normal.  Musculoskeletal: Normal range of motion.  Neurological: She is alert and oriented to person, place, and time.  Skin: Skin is warm and dry.     Assessment:  BP (!) 140/92 (BP Location: Left Arm, Patient Position: Sitting, Cuff Size: Normal) Comment: manually  Pulse 90   Temp 98.4 F (36.9 C) (Oral)   Resp 16   Ht 5' (1.524 m)   Wt 186 lb (84.4 kg)   SpO2 100%   BMI 36.33 kg/m   Plan:  1. Essential  hypertension Blood pressure is above goal on current medication regimen. Will increase amlodipoine to 10 mg daily.  Discussed target BP range and blood pressure goal. I have advised patient to check BP regularly and to call us back or report to clinic if the numbers are consistently higher than 140/90. We discussed the importance of compliance with medical therapy and DASH diet recommended, consequences of uncontrolled hypertension discussed.  - continue current BP medications - Urinalysis Dipstick - amLODipine (NORVASC) 10 MG tablet; Take 1 tablet (10 mg total) by mouth daily.  Dispense: 30 tablet; Refill: 5  2. Allergic rhinitis due to other allergic trigger, unspecified seasonality Continue Cetirizine 10 mg at bedtime - Olopatadine HCl 0.2 % SOLN; Apply 1 drop to eye daily as needed.  Dispense: 1 Bottle; Refill: 0   RTC: 1 week for bp check. 3 months for hypertension   Kaylee Pounds  MSN, FNP-C [facility] [address] [place], [place] [address] [phone]

## 2017-12-30 ENCOUNTER — Ambulatory Visit (INDEPENDENT_AMBULATORY_CARE_PROVIDER_SITE_OTHER): Payer: Self-pay | Admitting: Family Medicine

## 2017-12-30 ENCOUNTER — Encounter: Payer: Self-pay | Admitting: Family Medicine

## 2017-12-30 VITALS — BP 136/88 | HR 80 | Temp 98.0°F | Resp 16 | Ht 60.0 in | Wt 189.0 lb

## 2017-12-30 DIAGNOSIS — I10 Essential (primary) hypertension: Secondary | ICD-10-CM

## 2017-12-30 DIAGNOSIS — R202 Paresthesia of skin: Secondary | ICD-10-CM

## 2017-12-30 MED ORDER — GABAPENTIN 100 MG PO CAPS: 100.0000 mg | ORAL_CAPSULE | Freq: Every day | ORAL | 3 refills | Status: DC

## 2017-12-30 NOTE — Patient Instructions (Signed)
Will start a trial of gabapentin 100 mg at bedtime for paresthesias.  Paresthesia Paresthesia is a burning or prickling feeling. This feeling can happen in any part of the body. It often happens in the hands, arms, legs, or feet. Usually, it is not painful. In most cases, the feeling goes away in a short time and is not a sign of a serious problem. Follow these instructions at home:  Avoid drinking alcohol.  Try massage or needle therapy (acupuncture) to help with your problems.  Keep all follow-up visits as told by your doctor. This is important. Contact a doctor if:  You keep on having episodes of paresthesia.  Your burning or prickling feeling gets worse when you walk.  You have pain or cramps.  You feel dizzy.  You have a rash. Get help right away if:  You feel weak.  You have trouble walking or moving.  You have problems speaking, understanding, or seeing.  You feel confused.  You cannot control when you pee (urinate) or poop (bowel movement).  You lose feeling (numbness) after an injury.  You pass out (faint). This information is not intended to replace advice given to you by your health care provider. Make sure you discuss any questions you have with your health care provider. Document Released: 07/19/2008 Document Revised: 01/12/2016 Document Reviewed: 08/02/2014 Elsevier Interactive Patient Education  [employer].

## 2017-12-30 NOTE — Progress Notes (Signed)
Subjective:    Kaylee [name] is a 62 y.o. right hand dominant female presents complaining numbness to fingertips. Onset of symptoms was gradual, starting about 6 months ago. Symptoms are currently of moderate and 5 / 10 severity. Symptoms occur all day. She denies headaches, dizziness, blurred vision, lower extremity weakness, or fatigue. Symptoms are symmetrical.  Symptoms have not interfered with job or activities of daily living. She has not identified palliative or provocative factors related to symptoms.  Past Medical History:  Diagnosis Date  . Bell's palsy age 55   residual right sided weakness  . 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 Family history of breast cancer    Daughter is being treated for breast cancer  . Hyperlipidemia   . Hypertension age 56  . Seasonal allergies    Social History   Socioeconomic History  . Marital status: Single    Spouse name: Not on file  . Number of children: 4  . Years of education: Not on file  . Highest education level: Not on file  Occupational History  . Occupation: Training and development officer at [employer]    Employer: [employer]  Social Needs  . Financial resource strain: Somewhat hard  . Food insecurity:    Worry: Not on file    Inability: Not on file  . Transportation needs:    Medical: Not on file    Non-medical: Not on file  Tobacco Use  . Smoking status: Former Smoker    Types: Cigarettes    Last attempt to quit: 07/20/1982    Years since quitting: 35.4  . Smokeless tobacco: Never Used  Substance and Sexual Activity  . Alcohol use: Yes    Comment: socially  . Drug use: Yes    Types: Marijuana    Comment: occ  . Sexual activity: Not Currently    Partners: Male  Lifestyle  . Physical activity:    Days per week: Not on file    Minutes per session: Not on file  . Stress: Not on file  Relationships  . Social connections:    Talks on phone: Not on file    Gets together: Not on  file    Attends religious service: Not on file    Active member of club or organization: Not on file    Attends meetings of clubs or organizations: Not on file    Relationship status: Not on file  . Intimate partner violence:    Fear of current or ex partner: Not on file    Emotionally abused: Not on file    Physically abused: Not on file    Forced sexual activity: Not on file  Other Topics Concern  . Not on file  Social History Narrative   Lives with youngest son.  Son in [place] and 2 daughter are in [place].  No pets. Her daughter is currently being treated for breast cancer   Immunization History  Administered Date(s) Administered  . Tdap 12/29/2012   Allergies  Allergen Reactions  . Codeine Nausea And Vomiting  . Tomato Itching   Review of Systems  Constitutional: Negative.   HENT: Negative.   Eyes: Negative.   Respiratory: Negative.   Cardiovascular: Negative.   Gastrointestinal: Negative.   Genitourinary: Negative.   Musculoskeletal: Negative.   Skin: Negative.   Neurological: Positive for tingling. Negative for dizziness, sensory change, speech change, focal weakness, seizures, loss of consciousness, weakness and headaches.  Endo/Heme/Allergies: Negative.   Psychiatric/Behavioral: Negative.      Objective:  Physical Exam  Constitutional: She is oriented to person, place, and time. She appears well-developed and well-nourished.  HENT:  Head: Normocephalic.  Right Ear: External ear normal.  Eyes: Pupils are equal, round, and reactive to light.  Neck: Normal range of motion.  Cardiovascular: Normal rate, regular rhythm, normal heart sounds and intact distal pulses.  Pulmonary/Chest: Effort normal and breath sounds normal.  Abdominal: Soft. Bowel sounds are normal.  Neurological: She is alert and oriented to person, place, and time.  Skin: Skin is warm and dry.  Psychiatric: She has a normal mood and affect. Her behavior is normal. Judgment and thought content  normal.    Assessment:  BP 136/88 (BP Location: Left Arm, Patient Position: Sitting, Cuff Size: Large)   Pulse 80   Temp 98 F (36.7 C) (Oral)   Resp 16   Ht 5' (1.524 m)   Wt 189 lb (85.7 kg)   SpO2 100%   BMI 36.91 kg/m  Plan:  1. Paresthesias Will start a trial of gabapentin at bedtime for current symptoms of neuropathy. Reviewed previous labs, unremarkable. Also, physical exam unremarkable  - gabapentin (NEURONTIN) 100 MG capsule; Take 1 capsule (100 mg total) by mouth at bedtime.  Dispense: 30 capsule; Refill: 3  2. Essential hypertension Blood pressure at goal on current medication regimen. No medication changes warranted on current medication regimen.  We have discussed target BP range and blood pressure goal. I have advised patient to check BP regularly and to call us back or report to clinic if the numbers are consistently higher than 140/90. We discussed the importance of compliance with medical therapy and DASH diet recommended, consequences of uncontrolled hypertension discussed.  - continue current BP medications    The patient was given clear instructions to go to ER or return to medical center if symptoms do not improve, worsen or new problems develop. The patient verbalized understanding.     [name]  MSN, FNP-C [facility] [address] [place], [place] [address] [phone]

## 2018-01-15 ENCOUNTER — Ambulatory Visit (INDEPENDENT_AMBULATORY_CARE_PROVIDER_SITE_OTHER): Payer: Self-pay | Admitting: Family Medicine

## 2018-01-15 ENCOUNTER — Telehealth: Payer: Self-pay

## 2018-01-15 VITALS — BP 133/89 | HR 87 | Temp 98.3°F | Resp 16 | Ht 60.0 in | Wt 186.0 lb

## 2018-01-15 DIAGNOSIS — R220 Localized swelling, mass and lump, head: Secondary | ICD-10-CM

## 2018-01-15 DIAGNOSIS — T7840XA Allergy, unspecified, initial encounter: Secondary | ICD-10-CM

## 2018-01-15 DIAGNOSIS — S00521A Blister (nonthermal) of lip, initial encounter: Secondary | ICD-10-CM

## 2018-01-15 DIAGNOSIS — Z9189 Other specified personal risk factors, not elsewhere classified: Secondary | ICD-10-CM

## 2018-01-15 MED ORDER — DEXAMETHASONE SODIUM PHOSPHATE 4 MG/ML IJ SOLN: 2.0000 mg | Freq: Once | INTRAMUSCULAR | Status: DC

## 2018-01-15 MED ORDER — DEXAMETHASONE SODIUM PHOSPHATE 4 MG/ML IJ SOLN
2.0000 mg | Freq: Once | INTRAMUSCULAR | Status: AC
  Administered 2018-01-15: 2 mg via INTRAMUSCULAR

## 2018-01-15 MED ORDER — DIPHENHYDRAMINE HCL 12.5 MG/5ML PO SYRP: 12.5000 mg | ORAL_SOLUTION | Freq: Four times a day (QID) | ORAL | 0 refills | Status: DC | PRN

## 2018-01-15 MED ORDER — PREDNISONE 10 MG (21) PO TBPK: ORAL_TABLET | ORAL | 0 refills | Status: DC

## 2018-01-15 MED FILL — predniSONE 10 MG TABS: 10 | 7 days supply | Qty: 21 | Fill #0

## 2018-01-15 NOTE — Progress Notes (Signed)
History of Present Illness   Chief Complaint  Allergic Reaction (possible allergic reaction?) and Blister   Kaylee [name], a very pleasant female with a history of essential hypertension and neuropathy presents complaining of an allergic reaction after taking medication. Patient was started on gabapentin 1 week ago. She says that she was awakened from sleep with swelling to face and lips. She also reported difficulty swallowing. Patient notified office earlier today to report symptoms. She was advised to stop medication, take Benadryl 25 mg, and report to emergency department or the clinic immediately. Patient says that facial swelling has decreased slightly and swallowing has improved.  Patient denies chest pain, dyspnea on exertion, dyspnea while laying down, hemoptysis, non productive cough, productive cough, shortness of breath and wheezing. Patient denies similar previous allergic reactions.   Past Medical History:  Diagnosis Date  . Bell's palsy age 10   residual right sided weakness  . 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 Family history of breast cancer    Daughter is being treated for breast cancer  . Hyperlipidemia   . Hypertension age 37  . Seasonal allergies    Family History  Problem Relation Age of Onset  . Hypertension Mother   . Alcohol abuse Mother   . Alcohol abuse Father   . Alcohol abuse Brother   . Heart disease Brother 12  . Anxiety disorder Daughter        panic attacks  . Colon polyps Sister   . Stroke Neg Hx   . Colon cancer Neg Hx   . Cancer Neg Hx   . Diabetes Neg Hx    Scheduled Meds: . dexamethasone  2 mg Intramuscular Once   Continuous Infusions: PRN Meds:  Allergies  Allergen Reactions  . Codeine Nausea And Vomiting  . Gabapentin (Once-Daily)   . Tomato Itching   Social History   Socioeconomic History  . Marital status: Single    Spouse name: Not on file  . Number of children: 4  . Years of  education: Not on file  . Highest education level: Not on file  Occupational History  . Occupation: Training and development officer at [employer]    Employer: [employer]  Social Needs  . Financial resource strain: Somewhat hard  . Food insecurity:    Worry: Not on file    Inability: Not on file  . Transportation needs:    Medical: Not on file    Non-medical: Not on file  Tobacco Use  . Smoking status: Former Smoker    Types: Cigarettes    Last attempt to quit: 07/20/1982    Years since quitting: 35.5  . Smokeless tobacco: Never Used  Substance and Sexual Activity  . Alcohol use: Yes    Comment: socially  . Drug use: Yes    Types: Marijuana    Comment: occ  . Sexual activity: Not Currently    Partners: Male  Lifestyle  . Physical activity:    Days per week: Not on file    Minutes per session: Not on file  . Stress: Not on file  Relationships  . Social connections:    Talks on phone: Not on file    Gets together: Not on file    Attends religious service: Not on file    Active member of club or organization: Not on file    Attends meetings of clubs or organizations: Not on file    Relationship status:  Not on file  . Intimate partner violence:    Fear of current or ex partner: Not on file    Emotionally abused: Not on file    Physically abused: Not on file    Forced sexual activity: Not on file  Other Topics Concern  . Not on file  Social History Narrative   Lives with youngest son.  Son in [place] and 2 daughter are in [place].  No pets. Her daughter is currently being treated for breast cancer  Review of Systems  Constitutional: Negative for malaise/fatigue.  HENT: Negative for sore throat.        Facial swelling Blisters to lips  Eyes: Negative.   Respiratory: Negative.   Cardiovascular: Negative.   Genitourinary: Negative.   Musculoskeletal: Negative.   Skin: Negative.   Neurological: Negative.   Psychiatric/Behavioral: Negative.     Physical Exam  Physical  Exam  Constitutional: She is oriented to person, place, and time. She appears well-developed and well-nourished.  HENT:  Mouth/Throat: Posterior oropharyngeal erythema present.  Cardiovascular: Normal rate, regular rhythm and normal heart sounds.  Pulmonary/Chest: Effort normal and breath sounds normal.  Abdominal: Soft. Bowel sounds are normal.  Neurological: She is alert and oriented to person, place, and time.  Skin: Skin is warm and dry.     Blisters to lops  Left facial edema    BP 133/89 (BP Location: Left Arm, Patient Position: Sitting, Cuff Size: Normal)   Pulse 87   Temp 98.3 F (36.8 C) (Oral)   Resp 16   Ht 5' (1.524 m)   Wt 186 lb (84.4 kg)   SpO2 99%   BMI 36.33 kg/m     1. Allergic reaction, initial encounter - predniSONE (STERAPRED UNI-PAK 21 TAB) 10 MG (21) TBPK tablet; Tapered dose pack  Dispense: 21 tablet; Refill: 0 - diphenhydrAMINE (BENYLIN) 12.5 MG/5ML syrup; Take 5 mLs (12.5 mg total) by mouth 4 (four) times daily as needed for allergies.  Dispense: 120 mL; Refill: 0 - dexamethasone (DECADRON) injection 2 mg  2. Left facial swelling - predniSONE (STERAPRED UNI-PAK 21 TAB) 10 MG (21) TBPK tablet; Tapered dose pack  Dispense: 21 tablet; Refill: 0 - diphenhydrAMINE (BENYLIN) 12.5 MG/5ML syrup; Take 5 mLs (12.5 mg total) by mouth 4 (four) times daily as needed for allergies.  Dispense: 120 mL; Refill: 0 - dexamethasone (DECADRON) injection 2 mg  3. Blister of lip - predniSONE (STERAPRED UNI-PAK 21 TAB) 10 MG (21) TBPK tablet; Tapered dose pack  Dispense: 21 tablet; Refill: 0 - diphenhydrAMINE (BENYLIN) 12.5 MG/5ML syrup; Take 5 mLs (12.5 mg total) by mouth 4 (four) times daily as needed for allergies.  Dispense: 120 mL; Refill: 0 - dexamethasone (DECADRON) injection 2 mg    The patient was given clear instructions to go to ER or return to medical center if symptoms do not improve, worsen or new problems develop. The patient verbalized understanding.     RTC: As previously scheduled  [name]  MSN, FNP-C [facility] [address] [place], [place] [address] [phone]

## 2018-01-15 NOTE — Telephone Encounter (Signed)
Spoke with patient, advised to take benadryl 12.5mg  every 6 hours as needed and patient was scheduled for next available appointment. Thanks!

## 2018-01-15 NOTE — Patient Instructions (Addendum)
Diphenhydramine 12.5 mg every 6 hours as needed.  Prednisone per dose pack.  Day 1 (60 mg), Day 2 (50 mg), Day 3 (40 mg), Day 4 (30 mg), Day 5 ( 20 mg), Day 6 (10 mg)    Allergies An allergy is when your body reacts to a substance in a way that is not normal. An allergic reaction can happen after you:  Eat something.  Breathe in something.  Touch something.  You can be allergic to:  Things that are only around during certain seasons, like molds and pollens.  Foods.  Drugs.  Insects.  Animal dander.  What are the signs or symptoms?  Puffiness (swelling). This may happen on the lips, face, tongue, mouth, or throat.  Sneezing.  Coughing.  Breathing loudly (wheezing).  Stuffy nose.  Tingling in the mouth.  A rash.  Itching.  Itchy, red, puffy areas of skin (hives).  Watery eyes.  Throwing up (vomiting).  Watery poop (diarrhea).  Dizziness.  Feeling faint or fainting.  Trouble breathing or swallowing.  A tight feeling in the chest.  A fast heartbeat. How is this diagnosed? Allergies can be diagnosed with:  A medical and family history.  Skin tests.  Blood tests.  A food diary. A food diary is a record of all the foods, drinks, and symptoms you have each day.  The results of an elimination diet. This diet involves making sure not to eat certain foods and then seeing what happens when you start eating them again.  How is this treated? There is no cure for allergies, but allergic reactions can be treated with medicine. Severe reactions usually need to be treated at a hospital. How is this prevented? The best way to prevent an allergic reaction is to avoid the thing you are allergic to. Allergy shots and medicines can also help prevent reactions in some cases. This information is not intended to replace advice given to you by your health care provider. Make sure you discuss any questions you have with your health care provider. Document Released:  12/01/2012 Document Revised: 04/02/2016 Document Reviewed: 05/18/2014 Elsevier Interactive Patient Education  [employer].

## 2018-01-15 NOTE — Telephone Encounter (Signed)
-----   Message from Dorena Dew, [place] sent at 01/15/2018 10:21 AM EDT ----- Regarding: RE: medication reaction  Please have patient take benadryl 12.5 mg every 6 hours and scheduled first available follow up. Also, list medication as an allergy.   [name]  MSN, FNP-C [facility] [address] [place], [place] [address] [phone]  ----- Message ----- From: Adelina Mings, LPN Sent: 3/49/1791   9:05 AM To: Dorena Dew, FNP Subject: medication reaction                            Patient states after she started taking the gabapentin she experienced blisters on lips. She has since stopped medication. She wanted to make you aware. Any advise she should need? Please advise. Thanks!

## 2018-01-16 ENCOUNTER — Encounter: Payer: Self-pay | Admitting: Family Medicine

## 2018-01-20 MED FILL — AMLODIPINE BESYLATE 10 MG T: 10 | 30 days supply | Qty: 30 | Fill #1

## 2018-01-31 ENCOUNTER — Ambulatory Visit: Payer: Self-pay

## 2018-02-24 MED FILL — AMLODIPINE BESYLATE 10 MG T: 10 | 30 days supply | Qty: 30 | Fill #2

## 2018-04-02 ENCOUNTER — Ambulatory Visit (INDEPENDENT_AMBULATORY_CARE_PROVIDER_SITE_OTHER): Payer: Self-pay | Admitting: Family Medicine

## 2018-04-02 ENCOUNTER — Encounter: Payer: Self-pay | Admitting: Family Medicine

## 2018-04-02 VITALS — BP 140/88 | HR 97 | Temp 98.7°F | Resp 16 | Ht 60.0 in | Wt 187.0 lb

## 2018-04-02 DIAGNOSIS — E559 Vitamin D deficiency, unspecified: Secondary | ICD-10-CM

## 2018-04-02 DIAGNOSIS — E785 Hyperlipidemia, unspecified: Secondary | ICD-10-CM

## 2018-04-02 DIAGNOSIS — I10 Essential (primary) hypertension: Secondary | ICD-10-CM

## 2018-04-02 DIAGNOSIS — E782 Mixed hyperlipidemia: Secondary | ICD-10-CM

## 2018-04-02 LAB — POCT URINALYSIS DIPSTICK
Bilirubin, UA: NEGATIVE
Blood, UA: NEGATIVE
Glucose, UA: NEGATIVE
Ketones, UA: NEGATIVE
Leukocytes, UA: NEGATIVE
Nitrite, UA: NEGATIVE
Protein, UA: NEGATIVE
Spec Grav, UA: 1.025 (ref 1.010–1.025)
Urobilinogen, UA: 0.2 E.U./dL
pH, UA: 6 (ref 5.0–8.0)

## 2018-04-02 MED ORDER — HYDROCHLOROTHIAZIDE 12.5 MG PO CAPS: 12.5000 mg | ORAL_CAPSULE | Freq: Every day | ORAL | 5 refills | Status: DC

## 2018-04-02 MED ORDER — AMLODIPINE BESYLATE 10 MG PO TABS: 10.0000 mg | ORAL_TABLET | Freq: Every day | ORAL | 5 refills | Status: DC

## 2018-04-02 MED FILL — AMLODIPINE BESYLATE 10 MG T: 10 | 30 days supply | Qty: 30 | Fill #0

## 2018-04-02 MED FILL — HYDROCHLOROTHIAZIDE 12.5 MG: 12.5 | 30 days supply | Qty: 30 | Fill #0

## 2018-04-02 NOTE — Progress Notes (Signed)
Patient [name]  Chronic Disease Follow Up Provider: Lanae Boast, FNP  SUBJECTIVE:  Patient presents for follow up for the following  chronic conditions.   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   Hypertension  Blood pressure is not well controlled at home. Patient denies chest pain, dyspnea, lower extremity edema and palpitations. Medication compliance: Yes   Hyperlipidemia Compliance with treatment has been fair.  Patient Patient not currently on a statin. Took lipitor in the past.  muscle pain associated with her medications.  She is not exercising and is not adherent to a low-salt, ADA, heart healthy or carbohydrate modified diet.   Review of Systems  Constitutional: Negative.   HENT: Negative.   Eyes: Negative.   Respiratory: Negative.   Cardiovascular: Positive for leg swelling.  Gastrointestinal: Negative.   Genitourinary: Negative.   Musculoskeletal: Negative.   Skin: Negative.   Neurological: Negative.   Psychiatric/Behavioral: Negative.     OBJECTIVE:  BP (!) 142/84 (BP Location: Right Arm, Patient Position: Sitting, Cuff Size: Large)   Pulse 97   Temp 98.7 F (37.1 C) (Oral)   Resp 16   Ht 5' (1.524 m)   Wt 187 lb (84.8 kg)   SpO2 97%   BMI 36.52 kg/m   Physical Exam  Constitutional: She is oriented to person, place, and time and well-developed, well-nourished, and in no distress. No distress.  HENT:  Head: Normocephalic and atraumatic.  Eyes: Pupils are equal, round, and reactive to light. Conjunctivae and EOM are normal.  Neck: Normal range of motion. Neck supple.  Cardiovascular: Normal rate, regular rhythm and intact distal pulses. Exam reveals no gallop and no friction rub.  No murmur heard. Pulmonary/Chest: Effort normal and breath sounds normal. No respiratory  distress. She has no wheezes.  Abdominal: Soft. Bowel sounds are normal. There is no tenderness.  Musculoskeletal: Normal range of motion. She exhibits edema (mild non pitting edema bilaterally. ). She exhibits no tenderness.  Lymphadenopathy:    She has no cervical adenopathy.  Neurological: She is alert and oriented to person, place, and time. Gait normal.  Skin: Skin is warm and dry.  Psychiatric: Mood, memory, affect and judgment normal.  Nursing note and vitals reviewed.    ASSESSMENT/PLAN:  1. Essential hypertension Start HCTZ for the edema and to help with BP.  - Urinalysis Dipstick - CBC With Differential; Future - Comprehensive metabolic panel; Future - VITAMIN D 25 Hydroxy (Vit-D Deficiency, Fractures); Future - Lipid Panel With LDL/HDL Ratio; Future - amLODipine (NORVASC) 10 MG tablet; Take 1 tablet (10 mg total) by mouth daily.  Dispense: 30 tablet; Refill: 5 - hydrochlorothiazide (MICROZIDE) 12.5 MG capsule; Take 1 capsule (12.5 mg total) by mouth daily.  Dispense: 30 capsule; Refill: 5 - Lipid Panel With LDL/HDL Ratio - VITAMIN D 25 Hydroxy (Vit-D Deficiency, Fractures) - Comprehensive metabolic panel - CBC With Differential  2. Mixed hyperlipidemia Will order labs today.  - Lipid Panel With LDL/HDL Ratio; Future - Lipid Panel With LDL/HDL Ratio  3. Vitamin D deficiency Labs ordered - VITAMIN D 25 Hydroxy (Vit-D Deficiency, Fractures); Future - VITAMIN D 25 Hydroxy (Vit-D Deficiency, Fractures)  4. Hyperlipidemia, unspecified hyperlipidemia type - Lipid Panel     Return to care as scheduled and prn. Patient verbalized understanding and agreed with plan of care.   Kaylee [name]. Nathaneil Canary,  FNP-BC [facility] [address] [place], [place] [address] [phone]

## 2018-04-02 NOTE — Patient Instructions (Addendum)
I added a new medication: hydrochlorothiazide (HCTZ). This will help with reducing the leg swelling.  I will see you in 6 months.   Hypertension Hypertension is another name for high blood pressure. High blood pressure forces your heart to work harder to pump blood. This can cause problems over time. There are two numbers in a blood pressure reading. There is a top number (systolic) over a bottom number (diastolic). It is best to have a blood pressure below 120/80. Healthy choices can help lower your blood pressure. You may need medicine to help lower your blood pressure if:  Your blood pressure cannot be lowered with healthy choices.  Your blood pressure is higher than 130/80.  Follow these instructions at home: Eating and drinking  If directed, follow the DASH eating plan. This diet includes: ? Filling half of your plate at each meal with fruits and vegetables. ? Filling one quarter of your plate at each meal with whole grains. Whole grains include whole wheat pasta, brown rice, and whole grain bread. ? Eating or drinking low-fat dairy products, such as skim milk or low-fat yogurt. ? Filling one quarter of your plate at each meal with low-fat (lean) proteins. Low-fat proteins include fish, skinless chicken, eggs, beans, and tofu. ? Avoiding fatty meat, cured and processed meat, or chicken with skin. ? Avoiding premade or processed food.  Eat less than 1,500 mg of salt (sodium) a day.  Limit alcohol use to no more than 1 drink a day for nonpregnant women and 2 drinks a day for men. One drink equals 12 oz of beer, 5 oz of wine, or 1 oz of hard liquor. Lifestyle  Work with your doctor to stay at a healthy weight or to lose weight. Ask your doctor what the best weight is for you.  Get at least 30 minutes of exercise that causes your heart to beat faster (aerobic exercise) most days of the week. This may include walking, swimming, or biking.  Get at least 30 minutes of exercise that  strengthens your muscles (resistance exercise) at least 3 days a week. This may include lifting weights or pilates.  Do not use any products that contain nicotine or tobacco. This includes cigarettes and e-cigarettes. If you need help quitting, ask your doctor.  Check your blood pressure at home as told by your doctor.  Keep all follow-up visits as told by your doctor. This is important. Medicines  Take over-the-counter and prescription medicines only as told by your doctor. Follow directions carefully.  Do not skip doses of blood pressure medicine. The medicine does not work as well if you skip doses. Skipping doses also puts you at risk for problems.  Ask your doctor about side effects or reactions to medicines that you should watch for. Contact a doctor if:  You think you are having a reaction to the medicine you are taking.  You have headaches that keep coming back (recurring).  You feel dizzy.  You have swelling in your ankles.  You have trouble with your vision. Get help right away if:  You get a very bad headache.  You start to feel confused.  You feel weak or numb.  You feel faint.  You get very bad pain in your: ? Chest. ? Belly (abdomen).  You throw up (vomit) more than once.  You have trouble breathing. Summary  Hypertension is another name for high blood pressure.  Making healthy choices can help lower blood pressure. If your blood pressure cannot be  controlled with healthy choices, you may need to take medicine. This information is not intended to replace advice given to you by your health care provider. Make sure you discuss any questions you have with your health care provider. Document Released: 01/23/2008 Document Revised: 07/04/2016 Document Reviewed: 07/04/2016 Elsevier Interactive Patient Education  2018 [place] 2018 Physical Activity Guidelines recommend the equivalent of 150 minutes per week of moderate to vigorous aerobic activity  each week, with muscle-strengthening activities on two days during the week    PRESCRIPTION FOR EXERCISE  Frequency Four to five days per week  Intensity Moderate- During moderate intensity exercise, a person is too winded to sing but is not so winded they cannot talk  Time 30 minutes or a total of 150 minutes per week.   Type Brisk walking PLUS One set each of: 0 Body weight squats  10 0 Plank hold for 30 seconds   Basic bodyweight exercise guide: Squat and plank  (A) Body weight squat: 0 The squat develops strength in the legs and torso. Stand with your feet about shoulder's width apart and slightly externally rotated. Maintain your weight on your heels or midfoot throughout the exercise. Keep your lower back flat; do not allow it to round or to extend excessively. Your knees should remain aligned with your ankles and legs throughout the motion (knees caving inward towards one another is a common problem). Descend until your hips come just below the level of your knees. If strength or mobility limitations prevent you from reaching this depth, begin with partial squats and gradually increase the depth over time. Once you can perform 15 squats with proper technique and full depth, make the exercise more challenging by holding a weight. (B) Standard plank: 0 The basic plank develops torso (core) strength. Maintain a straight torso, not allowing your lower back to sag or your hips to elevate, throughout the exercise. Gradually increase the time you can hold the position.

## 2018-04-03 LAB — COMPREHENSIVE METABOLIC PANEL
ALT: 11 IU/L (ref 0–32)
AST: 18 IU/L (ref 0–40)
Albumin/Globulin Ratio: 1.6 (ref 1.2–2.2)
Albumin: 4.4 g/dL (ref 3.6–4.8)
Alkaline Phosphatase: 70 IU/L (ref 39–117)
BUN/Creatinine Ratio: 20 (ref 12–28)
BUN: 14 mg/dL (ref 8–27)
Bilirubin Total: <0.2 mg/dL (ref 0.0–1.2)
CO2: 24 mmol/L (ref 20–29)
Calcium: 9.3 mg/dL (ref 8.7–10.3)
Chloride: 104 mmol/L (ref 96–106)
Creatinine, Ser: 0.7 mg/dL (ref 0.57–1.00)
GFR calc Af Amer: 109 mL/min/{1.73_m2} (ref >59)
GFR calc non Af Amer: 94 mL/min/{1.73_m2} (ref >59)
Globulin, Total: 2.8 g/dL (ref 1.5–4.5)
Glucose: 81 mg/dL (ref 65–99)
Potassium: 4 mmol/L (ref 3.5–5.2)
Sodium: 140 mmol/L (ref 134–144)
Total Protein: 7.2 g/dL (ref 6.0–8.5)

## 2018-04-03 LAB — CBC WITH DIFFERENTIAL
Basophils Absolute: 0 10*3/uL (ref 0.0–0.2)
Basos: 0 %
EOS (ABSOLUTE): 0 10*3/uL (ref 0.0–0.4)
Eos: 1 %
Hematocrit: 37.3 % (ref 34.0–46.6)
Hemoglobin: 12.4 g/dL (ref 11.1–15.9)
Immature Grans (Abs): 0 10*3/uL (ref 0.0–0.1)
Immature Granulocytes: 0 %
Lymphocytes Absolute: 1.3 10*3/uL (ref 0.7–3.1)
Lymphs: 31 %
MCH: 30.6 pg (ref 26.6–33.0)
MCHC: 33.2 g/dL (ref 31.5–35.7)
MCV: 92 fL (ref 79–97)
Monocytes Absolute: 0.3 10*3/uL (ref 0.1–0.9)
Monocytes: 7 %
Neutrophils Absolute: 2.5 10*3/uL (ref 1.4–7.0)
Neutrophils: 61 %
RBC: 4.05 x10E6/uL (ref 3.77–5.28)
RDW: 14 % (ref 12.3–15.4)
WBC: 4.2 10*3/uL (ref 3.4–10.8)

## 2018-04-03 LAB — LIPID PANEL WITH LDL/HDL RATIO
Cholesterol, Total: 200 mg/dL — ABNORMAL HIGH (ref 100–199)
HDL: 65 mg/dL (ref >39)
LDL Calculated: 110 mg/dL — ABNORMAL HIGH (ref 0–99)
LDl/HDL Ratio: 1.7 ratio (ref 0.0–3.2)
Triglycerides: 124 mg/dL (ref 0–149)
VLDL Cholesterol Cal: 25 mg/dL (ref 5–40)

## 2018-04-03 LAB — VITAMIN D 25 HYDROXY (VIT D DEFICIENCY, FRACTURES): Vit D, 25-Hydroxy: 12.5 ng/mL — ABNORMAL LOW (ref 30.0–100.0)

## 2018-04-04 ENCOUNTER — Telehealth: Payer: Self-pay

## 2018-04-04 ENCOUNTER — Other Ambulatory Visit: Payer: Self-pay | Admitting: Family Medicine

## 2018-04-04 MED ORDER — VITAMIN D (ERGOCALCIFEROL) 1.25 MG (50000 UNIT) PO CAPS: 50000.0000 [IU] | ORAL_CAPSULE | ORAL | 0 refills | Status: DC

## 2018-04-04 NOTE — Telephone Encounter (Signed)
Called and spoke with patient. Advised that all labs are stable except vitamin D is low. Advised that she should take vitamin D 1,000 units daily. Patient verbalized understanding. Thanks!

## 2018-04-04 NOTE — Telephone Encounter (Signed)
-----   Message from Lanae Boast, [place] sent at 04/04/2018  8:38 AM EDT ----- Low Vitamin D. All other labs are stable. I will send Vit D to the pharmacy. After completion of the prescription, patient will need otc vitamin D of 1,000units daily.

## 2018-05-06 MED FILL — AMLODIPINE BESYLATE 10 MG T: 10 | 30 days supply | Qty: 30 | Fill #1

## 2018-05-06 MED FILL — HYDROCHLOROTHIAZIDE 12.5 MG: 12.5 | 30 days supply | Qty: 30 | Fill #1

## 2018-05-30 ENCOUNTER — Telehealth: Payer: Self-pay

## 2018-05-30 ENCOUNTER — Other Ambulatory Visit: Payer: Self-pay

## 2018-05-30 DIAGNOSIS — I10 Essential (primary) hypertension: Secondary | ICD-10-CM

## 2018-05-30 MED ORDER — CETIRIZINE HCL 10 MG PO TABS: 10.0000 mg | ORAL_TABLET | Freq: Every day | ORAL | 3 refills | Status: DC

## 2018-05-30 MED ORDER — AMLODIPINE BESYLATE 10 MG PO TABS: 10.0000 mg | ORAL_TABLET | Freq: Every day | ORAL | 5 refills | Status: DC

## 2018-05-30 MED ORDER — HYDROCHLOROTHIAZIDE 12.5 MG PO CAPS: 12.5000 mg | ORAL_CAPSULE | Freq: Every day | ORAL | 5 refills | Status: DC

## 2018-05-30 MED ORDER — VITAMIN D (ERGOCALCIFEROL) 1.25 MG (50000 UNIT) PO CAPS: 50000.0000 [IU] | ORAL_CAPSULE | ORAL | 0 refills | Status: AC

## 2018-05-30 NOTE — Telephone Encounter (Signed)
All routine [facility] Thanks!

## 2018-06-02 MED FILL — ?HYDROCHLOROTHIAZIDE 12.5MG: 12.5 | 30 days supply | Qty: 30 | Fill #0

## 2018-06-02 MED FILL — ?CETIRIZINE HCL 10 MG TABLE: 10 | 30 days supply | Qty: 30 | Fill #0

## 2018-06-02 MED FILL — AMLODIPINE BESYLATE 10 MG T: 10 | 30 days supply | Qty: 30 | Fill #0

## 2018-06-02 MED FILL — VIT D2 1.25 MG (50,000 UNIT: 1.25 MG | 28 days supply | Qty: 4 | Fill #0

## 2018-07-21 MED FILL — AMLODIPINE BESYLATE 10 MG T: 10 | 30 days supply | Qty: 30 | Fill #1

## 2018-08-25 ENCOUNTER — Telehealth: Payer: Self-pay

## 2018-08-25 DIAGNOSIS — I10 Essential (primary) hypertension: Secondary | ICD-10-CM

## 2018-08-25 MED ORDER — AMLODIPINE BESYLATE 10 MG PO TABS: 10.0000 mg | ORAL_TABLET | Freq: Every day | ORAL | 5 refills | Status: DC

## 2018-08-25 MED ORDER — HYDROCHLOROTHIAZIDE 12.5 MG PO CAPS: 12.5000 mg | ORAL_CAPSULE | Freq: Every day | ORAL | 5 refills | Status: DC

## 2018-08-25 MED FILL — HYDROCHLOROTHIAZIDE 12.5 MG: 12.5 | 30 days supply | Qty: 30 | Fill #1

## 2018-08-25 MED FILL — AMLODIPINE BESYLATE 10 MG T: 10 | 30 days supply | Qty: 30 | Fill #2

## 2018-08-25 NOTE — Telephone Encounter (Signed)
Refills sent into pharmacy. Thanks!  

## 2018-09-22 MED FILL — AMLODIPINE BESYLATE 10 MG T: 10 | 30 days supply | Qty: 30 | Fill #3

## 2018-10-03 ENCOUNTER — Ambulatory Visit (HOSPITAL_COMMUNITY)
Admission: RE | Admit: 2018-10-03 | Discharge: 2018-10-03 | Disposition: A | Discharge status: Home / Self Care | Payer: BLUE CROSS/BLUE SHIELD | Source: Ambulatory Visit | Attending: Family Medicine | Admitting: Family Medicine

## 2018-10-03 ENCOUNTER — Ambulatory Visit (INDEPENDENT_AMBULATORY_CARE_PROVIDER_SITE_OTHER): Payer: BLUE CROSS/BLUE SHIELD | Admitting: Family Medicine

## 2018-10-03 ENCOUNTER — Encounter: Payer: Self-pay | Admitting: Family Medicine

## 2018-10-03 VITALS — BP 136/88 | HR 74 | Temp 97.9°F | Resp 16 | Ht 60.0 in | Wt 189.0 lb

## 2018-10-03 DIAGNOSIS — M25552 Pain in left hip: Secondary | ICD-10-CM | POA: Insufficient documentation

## 2018-10-03 DIAGNOSIS — M5432 Sciatica, left side: Secondary | ICD-10-CM | POA: Diagnosis not present

## 2018-10-03 DIAGNOSIS — I10 Essential (primary) hypertension: Secondary | ICD-10-CM

## 2018-10-03 LAB — POCT URINALYSIS DIPSTICK
Bilirubin, UA: NEGATIVE
Blood, UA: NEGATIVE
Glucose, UA: NEGATIVE
Ketones, UA: NEGATIVE
Leukocytes, UA: NEGATIVE
Nitrite, UA: NEGATIVE
Protein, UA: NEGATIVE
Spec Grav, UA: 1.02 (ref 1.010–1.025)
Urobilinogen, UA: 0.2 E.U./dL
pH, UA: 6 (ref 5.0–8.0)

## 2018-10-03 MED ORDER — HYDROCHLOROTHIAZIDE 12.5 MG PO CAPS: 12.5000 mg | ORAL_CAPSULE | Freq: Every day | ORAL | 5 refills | Status: DC

## 2018-10-03 MED ORDER — METHOCARBAMOL 750 MG PO TABS: 750.0000 mg | ORAL_TABLET | Freq: Every evening | ORAL | 0 refills | Status: DC | PRN

## 2018-10-03 MED ORDER — IBUPROFEN 800 MG PO TABS: 800.0000 mg | ORAL_TABLET | Freq: Three times a day (TID) | ORAL | 0 refills | Status: DC | PRN

## 2018-10-03 MED ORDER — KETOROLAC TROMETHAMINE 60 MG/2ML IM SOLN
60.0000 mg | Freq: Once | INTRAMUSCULAR | Status: AC
  Administered 2018-10-03: 60 mg via INTRAMUSCULAR

## 2018-10-03 NOTE — Patient Instructions (Signed)
Sciatica    Sciatica is pain, numbness, weakness, or tingling along your sciatic nerve. The sciatic nerve starts in the lower back and goes down the back of each leg. Sciatica happens when this nerve is pinched or has pressure put on it. Sciatica usually goes away on its own or with treatment. Sometimes, sciatica may keep coming back (recur).  Follow these instructions at home:  Medicines  · Take over-the-counter and prescription medicines only as told by your doctor.  · Do not drive or use heavy machinery while taking prescription pain medicine.  Managing pain  · If directed, put ice on the affected area.  ? Put ice in a plastic bag.  ? Place a towel between your skin and the bag.  ? Leave the ice on for 20 minutes, 2-3 times a day.  · After icing, apply heat to the affected area before you exercise or as often as told by your doctor. Use the heat source that your doctor tells you to use, such as a moist heat pack or a heating pad.  ? Place a towel between your skin and the heat source.  ? Leave the heat on for 20-30 minutes.  ? Remove the heat if your skin turns bright red. This is especially important if you are unable to feel pain, heat, or cold. You may have a greater risk of getting burned.  Activity  · Return to your normal activities as told by your doctor. Ask your doctor what activities are safe for you.  ? Avoid activities that make your sciatica worse.  · Take short rests during the day. Rest in a lying or standing position. This is usually better than sitting to rest.  ? When you rest for a long time, do some physical activity or stretching between periods of rest.  ? Avoid sitting for a long time without moving. Get up and move around at least one time each hour.  · Exercise and stretch regularly, as told by your doctor.  · Do not lift anything that is heavier than 10 lb (4.5 kg) while you have symptoms of sciatica.  ? Avoid lifting heavy things even when you do not have symptoms.  ? Avoid lifting  heavy things over and over.  · When you lift objects, always lift in a way that is safe for your body. To do this, you should:  ? Bend your knees.  ? Keep the object close to your body.  ? Avoid twisting.  General instructions  · Use good posture.  ? Avoid leaning forward when you are sitting.  ? Avoid hunching over when you are standing.  · Stay at a healthy weight.  · Wear comfortable shoes that support your feet. Avoid wearing high heels.  · Avoid sleeping on a mattress that is too soft or too hard. You might have less pain if you sleep on a mattress that is firm enough to support your back.  · Keep all follow-up visits as told by your doctor. This is important.  Contact a doctor if:  · You have pain that:  ? Wakes you up when you are sleeping.  ? Gets worse when you lie down.  ? Is worse than the pain you have had in the past.  ? Lasts longer than 4 weeks.  · You lose weight for without trying.  Get help right away if:  · You cannot control when you pee (urinate) or poop (have a bowel movement).  · You   have weakness in any of these areas and it gets worse.  ? Lower back.  ? Lower belly (pelvis).  ? Butt (buttocks).  ? Legs.  · You have redness or swelling of your back.  · You have a burning feeling when you pee.  This information is not intended to replace advice given to you by your health care provider. Make sure you discuss any questions you have with your health care provider.  Document Released: 05/15/2008 Document Revised: 01/12/2016 Document Reviewed: 04/15/2015  Elsevier Interactive Patient Education © 2019 Elsevier Inc[employer]

## 2018-10-03 NOTE — Progress Notes (Signed)
Patient [facility]   Progress Note: General Provider: Lanae Boast, FNP  SUBJECTIVE:   Kaylee [name] is a 63 y.o. 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 Patient presents today for Hypertension; Hip Pain (left side hip pain that radiates to left knee ); and Numbness (numbness in arms when laying on side ) Patient presents today for follow-up on hypertension.  She does not follow a low-sodium diet.  Patient states that she does not exercise on a regular basis.  She is having left-sided lower back and hip pain x4 months.  She states that she takes 9 Aleve a day with relief.  Reports difficulty sleeping due to pain.  Patient states that when she lays on her side her left arm comes normal.  Also reports that her lower extremities are swollen at the end of her working shift.  Patient reports compliance with all medications.  Patient denies side effects of medications. Review of Systems  Constitutional: Negative.   HENT: Negative.   Eyes: Negative.   Respiratory: Negative.   Cardiovascular: Negative.   Gastrointestinal: Negative.   Genitourinary: Negative.   Musculoskeletal: Positive for back pain and joint pain.  Skin: Negative.   Neurological: Negative.   Psychiatric/Behavioral: Negative.      OBJECTIVE: BP 136/88   Pulse 74   Temp 97.9 F (36.6 C) (Oral)   Resp 16   Ht 5' (1.524 m)   Wt 189 lb (85.7 kg)   SpO2 100%   BMI 36.91 kg/m   Wt Readings from Last 3 Encounters:  10/03/18 189 lb (85.7 kg)  04/02/18 187 lb (84.8 kg)  01/15/18 186 lb (84.4 kg)     Physical Exam Vitals signs and nursing note reviewed.  Constitutional:      General: She is not in acute distress.    Appearance: She is well-developed.  HENT:     Head: Normocephalic and atraumatic.  Eyes:     Conjunctiva/sclera:  Conjunctivae normal.     Pupils: Pupils are equal, round, and reactive to light.  Neck:     Musculoskeletal: Normal range of motion.  Cardiovascular:     Rate and Rhythm: Normal rate and regular rhythm.     Heart sounds: Normal heart sounds.  Pulmonary:     Effort: Pulmonary effort is normal. No respiratory distress.     Breath sounds: Normal breath sounds.  Abdominal:     General: Bowel sounds are normal. There is no distension.     Palpations: Abdomen is soft.  Musculoskeletal:     Left hip: She exhibits decreased range of motion and tenderness.     Lumbar back: She exhibits tenderness and spasm.  Skin:    General: Skin is warm and dry.  Neurological:     Mental Status: She is alert and oriented to person, place, and time.  Psychiatric:        Mood and Affect: Mood normal.        Behavior: Behavior normal.        Thought Content: Thought content normal.        Judgment: Judgment normal.     ASSESSMENT/PLAN:  1. Essential hypertension No medication changes warranted at the present time.   - Urinalysis Dipstick - hydrochlorothiazide (MICROZIDE) 12.5 MG capsule; Take 1 capsule (12.5 mg total) by mouth daily.  Dispense: 30  capsule; Refill: 5  2. Left hip pain - DG HIPS BILAT WITH PELVIS 3-4 VIEWS; Future - ketorolac (TORADOL) injection 60 mg - ibuprofen (ADVIL,MOTRIN) 800 MG tablet; Take 1 tablet (800 mg total) by mouth every 8 (eight) hours as needed for moderate pain.  Dispense: 60 tablet; Refill: 0  3. Sciatica of left side - ibuprofen (ADVIL,MOTRIN) 800 MG tablet; Take 1 tablet (800 mg total) by mouth every 8 (eight) hours as needed for moderate pain.  Dispense: 60 tablet; Refill: 0 - DG Lumbar Spine Complete; Future - methocarbamol (ROBAXIN-750) 750 MG tablet; Take 1 tablet (750 mg total) by mouth at bedtime as needed for muscle spasms.  Dispense: 30 tablet; Refill: 0     Return in about 6 months (around 04/03/2019) for sciatica.    The patient was given clear  instructions to go to ER or return to medical center if symptoms do not improve, worsen or new problems develop. The patient verbalized understanding and agreed with plan of care.   Ms. Doug Sou. Nathaneil Canary, FNP-BC [facility] [address] [place], [place] [address] [phone]

## 2018-10-07 ENCOUNTER — Telehealth: Payer: Self-pay

## 2018-10-07 DIAGNOSIS — M5432 Sciatica, left side: Secondary | ICD-10-CM

## 2018-10-07 DIAGNOSIS — M25552 Pain in left hip: Secondary | ICD-10-CM

## 2018-10-07 DIAGNOSIS — I10 Essential (primary) hypertension: Secondary | ICD-10-CM

## 2018-10-08 ENCOUNTER — Telehealth: Payer: Self-pay

## 2018-10-08 MED ORDER — METHOCARBAMOL 750 MG PO TABS: 750.0000 mg | ORAL_TABLET | Freq: Every evening | ORAL | 0 refills | Status: DC | PRN

## 2018-10-08 MED ORDER — HYDROCHLOROTHIAZIDE 12.5 MG PO CAPS: 12.5000 mg | ORAL_CAPSULE | Freq: Every day | ORAL | 5 refills | Status: DC

## 2018-10-08 MED ORDER — IBUPROFEN 800 MG PO TABS: 800.0000 mg | ORAL_TABLET | Freq: Three times a day (TID) | ORAL | 0 refills | Status: DC | PRN

## 2018-10-08 MED FILL — IBUPROFEN 800 MG TABLET: 800 | 20 days supply | Qty: 60 | Fill #0

## 2018-10-08 MED FILL — METHOCARBAMOL 750 MG TABS: 750 | 30 days supply | Qty: 30 | Fill #0

## 2018-10-08 MED FILL — HYDROCHLOROTHIAZIDE 12.5 MG: 12.5 | 30 days supply | Qty: 30 | Fill #0

## 2018-10-08 NOTE — Telephone Encounter (Signed)
[facility] Thanks!

## 2018-10-08 NOTE — Telephone Encounter (Signed)
-----   Message from Lanae Boast, [place] sent at 10/06/2018 10:49 PM EST ----- Your X-ray showed spondylosis.  Spondylosis is common and worsens with age. This condition is often used to describe degenerative arthritis (osteoarthritis) of the spine. Continue with current medications. If continuing pain occurs, will refer to PT.

## 2018-10-08 NOTE — Telephone Encounter (Signed)
Called and spoke with patient, advised that xray showed spondylosis. Explained that spondylosis is common and described as degenerative arthritis which worsens with wage. Advised to continue current medications and that we can refer to pt if pain continues. Patient verbalized understanding. Thanks!

## 2018-10-17 MED FILL — AMLODIPINE BESYLATE 10 MG T: 10 | 30 days supply | Qty: 30 | Fill #4

## 2018-10-22 ENCOUNTER — Telehealth: Payer: Self-pay

## 2018-10-22 DIAGNOSIS — I10 Essential (primary) hypertension: Secondary | ICD-10-CM

## 2018-10-22 MED ORDER — AMLODIPINE BESYLATE 10 MG PO TABS: 10.0000 mg | ORAL_TABLET | Freq: Every day | ORAL | 5 refills | Status: DC

## 2018-10-22 NOTE — Telephone Encounter (Signed)
Called patient to verify pharmacy since it was not listed in phone call and a refill was sent 08/2018 to [facility] with 5 refills. Patient stated this needed to go to [place]. Refilled and medication sent.

## 2018-11-10 MED FILL — HYDROCHLOROTHIAZIDE 12.5 MG: 12.5 | 30 days supply | Qty: 30 | Fill #1

## 2018-11-10 MED FILL — ?CETIRIZINE HCL 10 MG TABLE: 10 | 90 days supply | Qty: 90 | Fill #1

## 2018-11-12 ENCOUNTER — Encounter: Payer: Self-pay | Admitting: Family Medicine

## 2018-11-12 ENCOUNTER — Ambulatory Visit (INDEPENDENT_AMBULATORY_CARE_PROVIDER_SITE_OTHER): Payer: BLUE CROSS/BLUE SHIELD | Admitting: Family Medicine

## 2018-11-12 ENCOUNTER — Other Ambulatory Visit: Payer: Self-pay

## 2018-11-12 VITALS — BP 142/98 | HR 85 | Temp 98.4°F | Resp 20 | Wt 189.2 lb

## 2018-11-12 DIAGNOSIS — J301 Allergic rhinitis due to pollen: Secondary | ICD-10-CM

## 2018-11-12 NOTE — Patient Instructions (Signed)
Stay home except to get medical care You should restrict activities outside your home, except for getting medical care. Do not go to work, school, or public areas, and do not use public transportation or taxis.  Call ahead before visiting your doctor Before your medical appointment, call the healthcare provider and tell them that you have, or are being evaluated for, COVID-19 infection. This will help the healthcare provider's office take steps to keep other people from getting infected. Ask your healthcare provider to call the local or state health department.  Monitor your symptoms Seek prompt medical attention if your illness is worsening (e.g., difficulty breathing). Before going to your medical appointment, call the healthcare provider and tell them that you have, or are being evaluated for, COVID-19 infection. Ask your healthcare provider to call the local or state health department.  Wear a facemask You should wear a facemask that covers your nose and mouth when you are in the same room with other people and when you visit a healthcare provider. People who live with or visit you should also wear a facemask while they are in the same room with you.  Separate yourself from other people in your home As much as possible, you should stay in a different room from other people in your home. Also, you should use a separate bathroom, if available.  Avoid sharing household items You should not share dishes, drinking glasses, cups, eating utensils, towels, bedding, or other items with other people in your home. After using these items, you should wash them thoroughly with soap and water.  Cover your coughs and sneezes Cover your mouth and nose with a tissue when you cough or sneeze, or you can cough or sneeze into your sleeve. Throw used tissues in a lined trash can, and immediately wash your hands with soap and water for at least 20 seconds or use an alcohol-based hand rub.  Wash your  [facility] your hands often and thoroughly with soap and water for at least 20 seconds. You can use an alcohol-based hand sanitizer if soap and water are not available and if your hands are not visibly dirty. Avoid touching your eyes, nose, and mouth with unwashed hands.    If you live with, or provide care at home for, a person confirmed to have, or being evaluated for, COVID-19 infection please follow these guidelines to prevent infection:  Follow healthcare provider's instructions Make sure that you understand and can help the patient follow any healthcare provider instructions for all care.  Provide for the patient's basic needs You should help the patient with basic needs in the home and provide support for getting groceries, prescriptions, and other personal needs.  Monitor the patient's symptoms If they are getting sicker, call his or her medical provider and tell them that the patient has, or is being evaluated for, COVID-19 infection. This will help the healthcare provider's office take steps to keep other people from getting infected. Ask the healthcare provider to call the local or state health department.  Limit the number of people who have contact with the patient  If possible, have only one caregiver for the patient.  Other household members should stay in another home or place of residence. If this is not possible, they should stay  in another room, or be separated from the patient as much as possible. Use a separate bathroom, if available.  Restrict visitors who do not have an essential need to be in the home.  Keep older  adults, very young children, and other sick people away from the patient Keep older adults, very young children, and those who have compromised immune systems or chronic health conditions away from the patient. This includes people with chronic heart, lung, or kidney conditions, diabetes, and cancer.  Ensure good ventilation Make sure that shared  spaces in the home have good air flow, such as from an air conditioner or an opened window, weather permitting.  Wash your hands often  Wash your hands often and thoroughly with soap and water for at least 20 seconds. You can use an alcohol based hand sanitizer if soap and water are not available and if your hands are not visibly dirty.  Avoid touching your eyes, nose, and mouth with unwashed hands.  Use disposable paper towels to dry your hands. If not available, use dedicated cloth towels and replace them when they become wet.  Wear a facemask and gloves  Wear a disposable facemask at all times in the room and gloves when you touch or have contact with the patient's blood, body fluids, and/or secretions or excretions, such as sweat, saliva, sputum, nasal mucus, vomit, urine, or feces.  Ensure the mask fits over your nose and mouth tightly, and do not touch it during use.  Throw out disposable facemasks and gloves after using them. Do not reuse.  Wash your hands immediately after removing your facemask and gloves.  If your personal clothing becomes contaminated, carefully remove clothing and launder. Wash your hands after handling contaminated clothing.  Place all used disposable facemasks, gloves, and other waste in a lined container before disposing them with other household waste.  Remove gloves and wash your hands immediately after handling these items.  Do not share dishes, glasses, or other household items with the patient  Avoid sharing household items. You should not share dishes, drinking glasses, cups, eating utensils, towels, bedding, or other items with a patient who is confirmed to have, or being evaluated for, COVID-19 infection.  After the person uses these items, you should wash them thoroughly with soap and water.  Wash laundry thoroughly  Immediately remove and wash clothes or bedding that have blood, body fluids, and/or secretions or excretions, such as sweat,  saliva, sputum, nasal mucus, vomit, urine, or feces, on them.  Wear gloves when handling laundry from the patient.  Read and follow directions on labels of laundry or clothing items and detergent. In general, wash and dry with the warmest temperatures recommended on the label.  Clean all areas the individual has used often  Clean all touchable surfaces, such as counters, tabletops, doorknobs, bathroom fixtures, toilets, phones, keyboards, tablets, and bedside tables, every day. Also, clean any surfaces that may have blood, body fluids, and/or secretions or excretions on them.  Wear gloves when cleaning surfaces the patient has come in contact with.  Use a diluted bleach solution (e.g., dilute bleach with 1 part bleach and 10 parts water) or a household disinfectant with a label that says EPA-registered for coronaviruses. To make a bleach solution at home, add 1 tablespoon of bleach to 1 quart (4 cups) of water. For a larger supply, add  cup of bleach to 1 gallon (16 cups) of water.  Read labels of cleaning products and follow recommendations provided on product labels. Labels contain instructions for safe and effective use of the cleaning product including precautions you should take when applying the product, such as wearing gloves or eye protection and making sure you have good ventilation during use  of the product.  Remove gloves and wash hands immediately after cleaning.  Monitor yourself for signs and symptoms of illness Caregivers and household members are considered close contacts, should monitor their health, and will be asked to limit movement outside of the home to the extent possible. Follow the monitoring steps for close contacts listed on the symptom monitoring form.   ? If you have additional questions, contact your local health department or call the epidemiologist on call at [phone] (available 24/7). ? This guidance is subject to change. For the most up-to-date  guidance from [facility], please refer to their website: [email or web address]

## 2018-11-12 NOTE — Progress Notes (Signed)
  Patient [facility]   Progress Note: Sick Visit Provider: Lanae Boast, FNP  SUBJECTIVE:   Kaylee [name] is a 63 y.o. 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 Patient presents today for Fever (102.1 on monday .); Nasal Congestion; and Headache Patient with a history of seasonal allergies. She states that she has been taking otc antihistamines with relief. Will need a note to return to work.  Review of Systems  Constitutional: Negative.   HENT: Positive for congestion and sinus pain. Negative for sore throat.   Eyes: Negative.   Respiratory: Positive for cough and sputum production. Negative for shortness of breath and wheezing.   Cardiovascular: Negative.   Gastrointestinal: Negative.   Genitourinary: Negative.   Musculoskeletal: Negative.  Negative for myalgias.  Skin: Negative.   Neurological: Negative.   Psychiatric/Behavioral: Negative.      OBJECTIVE: BP (!) 142/98   Pulse 85   Temp 98.4 F (36.9 C)   Resp 20   Wt 189 lb 3.2 oz (85.8 kg)   SpO2 97%   BMI 36.95 kg/m   Wt Readings from Last 3 Encounters:  11/12/18 189 lb 3.2 oz (85.8 kg)  10/03/18 189 lb (85.7 kg)  04/02/18 187 lb (84.8 kg)     Physical Exam Vitals signs and nursing note reviewed.  Constitutional:      General: She is not in acute distress.    Appearance: She is well-developed.  HENT:     Head: Normocephalic and atraumatic.  Eyes:     Extraocular Movements: Extraocular movements intact.     Conjunctiva/sclera: Conjunctivae normal.     Pupils: Pupils are equal, round, and reactive to light.  Neck:     Musculoskeletal: Normal range of motion.  Cardiovascular:     Rate and Rhythm: Normal rate and regular rhythm.     Heart sounds: Normal heart sounds.  Pulmonary:     Effort: Pulmonary effort is normal.  No respiratory distress.     Breath sounds: Normal breath sounds.  Abdominal:     General: Bowel sounds are normal. There is no distension.     Palpations: Abdomen is soft.  Musculoskeletal: Normal range of motion.  Skin:    General: Skin is warm and dry.  Neurological:     Mental Status: She is alert and oriented to person, place, and time.  Psychiatric:        Mood and Affect: Mood normal.        Speech: Speech normal.        Behavior: Behavior normal.        Thought Content: Thought content normal.     ASSESSMENT/PLAN:   1. Non-seasonal allergic rhinitis due to pollen Continue with otc antihistamines. Patient does not meet criteria for COVID testing. Letter written       The patient was given clear instructions to go to ER or return to medical center if symptoms do not improve, worsen or new problems develop. The patient verbalized understanding and agreed with plan of care.   Ms. Doug Sou. Nathaneil Canary, FNP-BC Patient [facility] [address] [place], [place] [address] [phone]     This note has been created with Dragon speech recognition software and smart phrase technology. Any transcriptional errors are unintentional.

## 2018-11-19 ENCOUNTER — Telehealth: Payer: Self-pay

## 2018-11-19 NOTE — Telephone Encounter (Signed)
Patient states that she was talking to EMS and they state that she is not suppose to Ibuprofen because it could spread the COV-19 faster. Please advise is patient should continue taking.

## 2018-11-20 NOTE — Telephone Encounter (Signed)
Ok to stop and take tylenol for pain.

## 2018-11-21 NOTE — Telephone Encounter (Signed)
Patient notified

## 2018-11-26 MED FILL — AMLODIPINE BESYLATE 10 MG T: 10 | 30 days supply | Qty: 30 | Fill #5

## 2018-12-02 ENCOUNTER — Telehealth: Payer: Self-pay

## 2018-12-02 DIAGNOSIS — M79672 Pain in left foot: Secondary | ICD-10-CM

## 2018-12-02 DIAGNOSIS — M79671 Pain in right foot: Secondary | ICD-10-CM

## 2018-12-02 NOTE — Telephone Encounter (Signed)
Patient states that the heel spurs are still hurting and she can't hardly put any pressure on it. Patient has been taking Tylenol and using alcohol with no relief. Patient is aware that you are out of office until tomorrow. Please advise

## 2018-12-02 NOTE — Telephone Encounter (Signed)
Patient notified

## 2018-12-04 MED FILL — HYDROCHLOROTHIAZIDE 12.5 MG: 12.5 | 30 days supply | Qty: 30 | Fill #2

## 2018-12-26 MED FILL — AMLODIPINE BESYLATE 10 MG T: 10 | 30 days supply | Qty: 30 | Fill #0

## 2019-01-06 MED FILL — HYDROCHLOROTHIAZIDE 12.5 MG: 12.5 | 30 days supply | Qty: 30 | Fill #3

## 2019-01-27 MED FILL — AMLODIPINE BESYLATE 10 MG T: 10 | 30 days supply | Qty: 30 | Fill #1

## 2019-02-10 MED FILL — HYDROCHLOROTHIAZIDE 12.5 MG: 12.5 | 30 days supply | Qty: 30 | Fill #4

## 2019-03-02 MED FILL — AMLODIPINE BESYLATE 10 MG T: 10 | 30 days supply | Qty: 30 | Fill #2

## 2019-03-12 MED FILL — HYDROCHLOROTHIAZIDE 12.5 MG: 12.5 | 30 days supply | Qty: 30 | Fill #5

## 2019-04-03 ENCOUNTER — Encounter: Payer: Self-pay | Admitting: Family Medicine

## 2019-04-03 ENCOUNTER — Other Ambulatory Visit: Payer: Self-pay

## 2019-04-03 ENCOUNTER — Ambulatory Visit (INDEPENDENT_AMBULATORY_CARE_PROVIDER_SITE_OTHER): Payer: BLUE CROSS/BLUE SHIELD | Admitting: Family Medicine

## 2019-04-03 VITALS — BP 134/94 | HR 84 | Temp 98.4°F | Resp 16 | Ht 60.0 in | Wt 191.0 lb

## 2019-04-03 DIAGNOSIS — M5432 Sciatica, left side: Secondary | ICD-10-CM

## 2019-04-03 DIAGNOSIS — E782 Mixed hyperlipidemia: Secondary | ICD-10-CM

## 2019-04-03 DIAGNOSIS — I10 Essential (primary) hypertension: Secondary | ICD-10-CM | POA: Diagnosis not present

## 2019-04-03 LAB — POCT URINALYSIS DIPSTICK
Bilirubin, UA: NEGATIVE
Blood, UA: NEGATIVE
Glucose, UA: NEGATIVE
Ketones, UA: NEGATIVE
Nitrite, UA: NEGATIVE
Protein, UA: NEGATIVE
Spec Grav, UA: 1.025 (ref 1.010–1.025)
Urobilinogen, UA: 0.2 E.U./dL
pH, UA: 5.5 (ref 5.0–8.0)

## 2019-04-03 MED ORDER — METHOCARBAMOL 750 MG PO TABS: 750.0000 mg | ORAL_TABLET | Freq: Every evening | ORAL | 0 refills | Status: DC | PRN

## 2019-04-03 MED FILL — METHOCARBAMOL 750 MG TABS: 750 | 30 days supply | Qty: 30 | Fill #0

## 2019-04-03 NOTE — Patient Instructions (Signed)
High Cholesterol  High cholesterol is a condition in which the blood has high levels of a white, waxy, fat-like substance (cholesterol). The human body needs small amounts of cholesterol. The liver makes all the cholesterol that the body needs. Extra (excess) cholesterol comes from the food that we eat. Cholesterol is carried from the liver by the blood through the blood vessels. If you have high cholesterol, deposits (plaques) may build up on the walls of your blood vessels (arteries). Plaques make the arteries narrower and stiffer. Cholesterol plaques increase your risk for heart attack and stroke. Work with your health care provider to keep your cholesterol levels in a healthy range. What increases the risk? This condition is more likely to develop in people who:  Eat foods that are high in animal fat (saturated fat) or cholesterol.  Are overweight.  Are not getting enough exercise.  Have a family history of high cholesterol. What are the signs or symptoms? There are no symptoms of this condition. How is this diagnosed? This condition may be diagnosed from the results of a blood test.  If you are older than age 20, your health care provider may check your cholesterol every 4-6 years.  You may be checked more often if you already have high cholesterol or other risk factors for heart disease. The blood test for cholesterol measures:  "Bad" cholesterol (LDL cholesterol). This is the main type of cholesterol that causes heart disease. The desired level for LDL is less than 100.  "Good" cholesterol (HDL cholesterol). This type helps to protect against heart disease by cleaning the arteries and carrying the LDL away. The desired level for HDL is 60 or higher.  Triglycerides. These are fats that the body can store or burn for energy. The desired number for triglycerides is lower than 150.  Total cholesterol. This is a measure of the total amount of cholesterol in your blood, including LDL  cholesterol, HDL cholesterol, and triglycerides. A healthy number is less than 200. How is this treated? This condition is treated with diet changes, lifestyle changes, and medicines. Diet changes  This may include eating more whole grains, fruits, vegetables, nuts, and fish.  This may also include cutting back on red meat and foods that have a lot of added sugar. Lifestyle changes  Changes may include getting at least 40 minutes of aerobic exercise 3 times a week. Aerobic exercises include walking, biking, and swimming. Aerobic exercise along with a healthy diet can help you maintain a healthy weight.  Changes may also include quitting smoking. Medicines  Medicines are usually given if diet and lifestyle changes have failed to reduce your cholesterol to healthy levels.  Your health care provider may prescribe a statin medicine. Statin medicines have been shown to reduce cholesterol, which can reduce the risk of heart disease. Follow these instructions at home: Eating and drinking If told by your health care provider:  Eat chicken (without skin), fish, veal, shellfish, ground turkey breast, and round or loin cuts of red meat.  Do not eat fried foods or fatty meats, such as hot dogs and salami.  Eat plenty of fruits, such as apples.  Eat plenty of vegetables, such as broccoli, potatoes, and carrots.  Eat beans, peas, and lentils.  Eat grains such as barley, rice, couscous, and bulgur wheat.  Eat pasta without cream sauces.  Use skim or nonfat milk, and eat low-fat or nonfat yogurt and cheeses.  Do not eat or drink whole milk, cream, ice cream, egg yolks,   or hard cheeses.  Do not eat stick margarine or tub margarines that contain trans fats (also called partially hydrogenated oils).  Do not eat saturated tropical oils, such as coconut oil and palm oil.  Do not eat cakes, cookies, crackers, or other baked goods that contain trans fats.  General instructions  Exercise as  directed by your health care provider. Increase your activity level with activities such as gardening, walking, and taking the stairs.  Take over-the-counter and prescription medicines only as told by your health care provider.  Do not use any products that contain nicotine or tobacco, such as cigarettes and e-cigarettes. If you need help quitting, ask your health care provider.  Keep all follow-up visits as told by your health care provider. This is important. Contact a health care provider if:  You are struggling to maintain a healthy diet or weight.  You need help to start on an exercise program.  You need help to stop smoking. Get help right away if:  You have chest pain.  You have trouble breathing. This information is not intended to replace advice given to you by your health care provider. Make sure you discuss any questions you have with your health care provider. Document Released: 08/06/2005 Document Revised: 08/09/2017 Document Reviewed: 02/04/2016 Elsevier Patient Education  2020 [employer]. Hypertension, Adult Hypertension is another name for high blood pressure. High blood pressure forces your heart to work harder to pump blood. This can cause problems over time. There are two numbers in a blood pressure reading. There is a top number (systolic) over a bottom number (diastolic). It is best to have a blood pressure that is below 120/80. Healthy choices can help lower your blood pressure, or you may need medicine to help lower it. What are the causes? The cause of this condition is not known. Some conditions may be related to high blood pressure. What increases the risk?  Smoking.  Having type 2 diabetes mellitus, high cholesterol, or both.  Not getting enough exercise or physical activity.  Being overweight.  Having too much fat, sugar, calories, or salt (sodium) in your diet.  Drinking too much alcohol.  Having long-term (chronic) kidney disease.  Having a  family history of high blood pressure.  Age. Risk increases with age.  Race. You may be at higher risk if you are African American.  Gender. Men are at higher risk than women before age 33. After age 4, women are at higher risk than men.  Having obstructive sleep apnea.  Stress. What are the signs or symptoms?  High blood pressure may not cause symptoms. Very high blood pressure (hypertensive crisis) may cause: ? Headache. ? Feelings of worry or nervousness (anxiety). ? Shortness of breath. ? Nosebleed. ? A feeling of being sick to your stomach (nausea). ? Throwing up (vomiting). ? Changes in how you see. ? Very bad chest pain. ? Seizures. How is this treated?  This condition is treated by making healthy lifestyle changes, such as: ? Eating healthy foods. ? Exercising more. ? Drinking less alcohol.  Your health care provider may prescribe medicine if lifestyle changes are not enough to get your blood pressure under control, and if: ? Your top number is above 130. ? Your bottom number is above 80.  Your personal target blood pressure may vary. Follow these instructions at home: Eating and drinking   If told, follow the DASH eating plan. To follow this plan: ? Fill one half of your plate at each meal  with fruits and vegetables. ? Fill one fourth of your plate at each meal with whole grains. Whole grains include whole-wheat pasta, brown rice, and whole-grain bread. ? Eat or drink low-fat dairy products, such as skim milk or low-fat yogurt. ? Fill one fourth of your plate at each meal with low-fat (lean) proteins. Low-fat proteins include fish, chicken without skin, eggs, beans, and tofu. ? Avoid fatty meat, cured and processed meat, or chicken with skin. ? Avoid pre-made or processed food.  Eat less than 1,500 mg of salt each day.  Do not drink alcohol if: ? Your doctor tells you not to drink. ? You are pregnant, may be pregnant, or are planning to become pregnant.   If you drink alcohol: ? Limit how much you use to:  0-1 drink a day for women.  0-2 drinks a day for men. ? Be aware of how much alcohol is in your drink. In the [place] one drink equals one 12 oz bottle of beer (355 mL), one 5 oz glass of wine (148 mL), or one 1 oz glass of hard liquor (44 mL). Lifestyle   Work with your doctor to stay at a healthy weight or to lose weight. Ask your doctor what the best weight is for you.  Get at least 30 minutes of exercise most days of the week. This may include walking, swimming, or biking.  Get at least 30 minutes of exercise that strengthens your muscles (resistance exercise) at least 3 days a week. This may include lifting weights or doing Pilates.  Do not use any products that contain nicotine or tobacco, such as cigarettes, e-cigarettes, and chewing tobacco. If you need help quitting, ask your doctor.  Check your blood pressure at home as told by your doctor.  Keep all follow-up visits as told by your doctor. This is important. Medicines  Take over-the-counter and prescription medicines only as told by your doctor. Follow directions carefully.  Do not skip doses of blood pressure medicine. The medicine does not work as well if you skip doses. Skipping doses also puts you at risk for problems.  Ask your doctor about side effects or reactions to medicines that you should watch for. Contact a doctor if you:  Think you are having a reaction to the medicine you are taking.  Have headaches that keep coming back (recurring).  Feel dizzy.  Have swelling in your ankles.  Have trouble with your vision. Get help right away if you:  Get a very bad headache.  Start to feel mixed up (confused).  Feel weak or numb.  Feel faint.  Have very bad pain in your: ? Chest. ? Belly (abdomen).  Throw up more than once.  Have trouble breathing. Summary  Hypertension is another name for high blood pressure.  High blood pressure forces your  heart to work harder to pump blood.  For most people, a normal blood pressure is less than 120/80.  Making healthy choices can help lower blood pressure. If your blood pressure does not get lower with healthy choices, you may need to take medicine. This information is not intended to replace advice given to you by your health care provider. Make sure you discuss any questions you have with your health care provider. Document Released: 01/23/2008 Document Revised: 04/16/2018 Document Reviewed: 04/16/2018 Elsevier Patient Education  2020 [employer].

## 2019-04-03 NOTE — Progress Notes (Signed)
[facility]   Progress Note: General Provider: Lanae Boast, FNP  SUBJECTIVE:   Kaylee [name] is a 63 y.o. female who  has a past medical history of Bell's palsy (age 27), Chronic pain in right ear, Family history of breast cancer, Hyperlipidemia, Hypertension (age 53), and Seasonal allergies.. Patient presents today for Hypertension and Hyperlipidemia  Hypertension This is a chronic problem. The current episode started more than 1 year ago. The problem is unchanged. The problem is controlled. Associated symptoms include peripheral edema. Agents associated with hypertension include NSAIDs. Risk factors for coronary artery disease include dyslipidemia, obesity, sedentary lifestyle and stress. Past treatments include ACE inhibitors. The current treatment provides moderate improvement.  Hyperlipidemia This is a chronic problem. The current episode started more than 1 year ago. The problem is uncontrolled. Recent lipid tests were reviewed and are variable. Exacerbating diseases include obesity. Factors aggravating her hyperlipidemia include fatty foods. Associated symptoms include leg pain and myalgias.  Mildly elevated   Review of Systems  Constitutional: Negative.   HENT: Negative.   Eyes: Negative.   Respiratory: Negative.   Cardiovascular: Negative.   Gastrointestinal: Negative.   Genitourinary: Negative.   Musculoskeletal: Positive for back pain, joint pain (lower extremities "cramping". ) and myalgias.  Skin: Negative.   Neurological: Negative.   Psychiatric/Behavioral: Negative.      OBJECTIVE: BP (!) 134/94 (BP Location: Left Arm, Patient Position: Sitting, Cuff Size: Normal)   Pulse 84   Temp 98.4 F (36.9 C) (Oral)   Resp 16   Ht 5' (1.524 m)   Wt 191 lb (86.6 kg)   SpO2 100%   BMI 37.30 kg/m   Wt Readings from Last 3 Encounters:  04/03/19 191 lb (86.6 kg)  11/12/18 189 lb 3.2 oz (85.8 kg)  10/03/18 189 lb (85.7  kg)     Physical Exam Vitals signs and nursing note reviewed.  Constitutional:      General: She is not in acute distress.    Appearance: Normal appearance.  HENT:     Head: Normocephalic and atraumatic.  Eyes:     Extraocular Movements: Extraocular movements intact.     Conjunctiva/sclera: Conjunctivae normal.     Pupils: Pupils are equal, round, and reactive to light.  Cardiovascular:     Rate and Rhythm: Normal rate and regular rhythm.     Heart sounds: No murmur.  Pulmonary:     Effort: Pulmonary effort is normal.     Breath sounds: Normal breath sounds.  Musculoskeletal: Normal range of motion.  Skin:    General: Skin is warm and dry.  Neurological:     Mental Status: She is alert and oriented to person, place, and time.  Psychiatric:        Mood and Affect: Mood normal.        Behavior: Behavior normal.        Thought Content: Thought content normal.        Judgment: Judgment normal.     ASSESSMENT/PLAN:  1. Essential hypertension - Urinalysis Dipstick - Comprehensive metabolic panel; Future  2. Sciatica of left side - methocarbamol (ROBAXIN-750) 750 MG tablet; Take 1 tablet (750 mg total) by mouth at bedtime as needed for muscle spasms.  Dispense: 30 tablet; Refill: 0  3. Mixed hyperlipidemia - Lipid Panel With LDL/HDL Ratio; Future  The patient is asked to make an attempt to improve diet and exercise patterns to aid in medical management of this problem.  Return in about 6 months (around 10/04/2019), or Monday for fasting labs, for htn.    The patient was given clear instructions to go to ER or return to medical center if symptoms do not improve, worsen or new problems develop. The patient verbalized understanding and agreed with plan of care.   Ms. Doug Sou. Nathaneil Canary, FNP-BC [facility] [address] [place], [place] [address] [phone]

## 2019-04-06 ENCOUNTER — Other Ambulatory Visit: Payer: Self-pay

## 2019-04-06 ENCOUNTER — Other Ambulatory Visit: Payer: BLUE CROSS/BLUE SHIELD

## 2019-04-06 DIAGNOSIS — E782 Mixed hyperlipidemia: Secondary | ICD-10-CM

## 2019-04-06 DIAGNOSIS — I10 Essential (primary) hypertension: Secondary | ICD-10-CM

## 2019-04-07 LAB — COMPREHENSIVE METABOLIC PANEL
ALT: 10 IU/L (ref 0–32)
AST: 19 IU/L (ref 0–40)
Albumin/Globulin Ratio: 1.5 (ref 1.2–2.2)
Albumin: 4.3 g/dL (ref 3.8–4.8)
Alkaline Phosphatase: 84 IU/L (ref 39–117)
BUN/Creatinine Ratio: 28 (ref 12–28)
BUN: 18 mg/dL (ref 8–27)
Bilirubin Total: <0.2 mg/dL (ref 0.0–1.2)
CO2: 22 mmol/L (ref 20–29)
Calcium: 9.5 mg/dL (ref 8.7–10.3)
Chloride: 104 mmol/L (ref 96–106)
Creatinine, Ser: 0.64 mg/dL (ref 0.57–1.00)
GFR calc Af Amer: 111 mL/min/{1.73_m2} (ref >59)
GFR calc non Af Amer: 96 mL/min/{1.73_m2} (ref >59)
Globulin, Total: 2.9 g/dL (ref 1.5–4.5)
Glucose: 89 mg/dL (ref 65–99)
Potassium: 4.2 mmol/L (ref 3.5–5.2)
Sodium: 140 mmol/L (ref 134–144)
Total Protein: 7.2 g/dL (ref 6.0–8.5)

## 2019-04-07 LAB — LIPID PANEL WITH LDL/HDL RATIO
Cholesterol, Total: 188 mg/dL (ref 100–199)
HDL: 60 mg/dL (ref >39)
LDL Calculated: 95 mg/dL (ref 0–99)
LDl/HDL Ratio: 1.6 ratio (ref 0.0–3.2)
Triglycerides: 167 mg/dL — ABNORMAL HIGH (ref 0–149)
VLDL Cholesterol Cal: 33 mg/dL (ref 5–40)

## 2019-04-08 ENCOUNTER — Telehealth: Payer: Self-pay

## 2019-04-09 ENCOUNTER — Encounter (HOSPITAL_COMMUNITY): Payer: Self-pay | Admitting: Emergency Medicine

## 2019-04-09 ENCOUNTER — Ambulatory Visit: Payer: BLUE CROSS/BLUE SHIELD | Admitting: Family Medicine

## 2019-04-09 ENCOUNTER — Emergency Department (HOSPITAL_COMMUNITY): Payer: BLUE CROSS/BLUE SHIELD

## 2019-04-09 ENCOUNTER — Emergency Department (HOSPITAL_COMMUNITY)
Admission: EM | Admit: 2019-04-09 | Discharge: 2019-04-09 | Disposition: A | Discharge status: Home / Self Care | Payer: BLUE CROSS/BLUE SHIELD | Attending: Emergency Medicine | Admitting: Emergency Medicine

## 2019-04-09 ENCOUNTER — Other Ambulatory Visit: Payer: Self-pay

## 2019-04-09 DIAGNOSIS — Z87891 Personal history of nicotine dependence: Secondary | ICD-10-CM | POA: Insufficient documentation

## 2019-04-09 DIAGNOSIS — Z79899 Other long term (current) drug therapy: Secondary | ICD-10-CM | POA: Insufficient documentation

## 2019-04-09 DIAGNOSIS — R0602 Shortness of breath: Secondary | ICD-10-CM | POA: Insufficient documentation

## 2019-04-09 DIAGNOSIS — Z791 Long term (current) use of non-steroidal anti-inflammatories (NSAID): Secondary | ICD-10-CM | POA: Diagnosis not present

## 2019-04-09 DIAGNOSIS — I10 Essential (primary) hypertension: Secondary | ICD-10-CM | POA: Diagnosis not present

## 2019-04-09 LAB — BASIC METABOLIC PANEL
Anion gap: 11 (ref 5–15)
BUN: 14 mg/dL (ref 8–23)
CO2: 23 mmol/L (ref 22–32)
Calcium: 9.2 mg/dL (ref 8.9–10.3)
Chloride: 105 mmol/L (ref 98–111)
Creatinine, Ser: 0.85 mg/dL (ref 0.44–1.00)
GFR calc Af Amer: >60 mL/min (ref >60)
GFR calc non Af Amer: >60 mL/min (ref >60)
Glucose, Bld: 122 mg/dL — ABNORMAL HIGH (ref 70–99)
Potassium: 3.4 mmol/L — ABNORMAL LOW (ref 3.5–5.1)
Sodium: 139 mmol/L (ref 135–145)

## 2019-04-09 LAB — CBC
HCT: 42.2 % (ref 36.0–46.0)
Hemoglobin: 13.1 g/dL (ref 12.0–15.0)
MCH: 29.6 pg (ref 26.0–34.0)
MCHC: 31 g/dL (ref 30.0–36.0)
MCV: 95.3 fL (ref 80.0–100.0)
Platelets: 265 10*3/uL (ref 150–400)
RBC: 4.43 MIL/uL (ref 3.87–5.11)
RDW: 13.3 % (ref 11.5–15.5)
WBC: 2.4 10*3/uL — ABNORMAL LOW (ref 4.0–10.5)
nRBC: 0 % (ref 0.0–0.2)

## 2019-04-09 LAB — TROPONIN I (HIGH SENSITIVITY)
Troponin I (High Sensitivity): 2 ng/L (ref <18)
Troponin I (High Sensitivity): 3 ng/L (ref <18)

## 2019-04-09 LAB — D-DIMER, QUANTITATIVE: D-Dimer, Quant: <0.27 ug/mL-FEU (ref 0.00–0.50)

## 2019-04-09 NOTE — ED Notes (Signed)
Patient has extra blood in the main lab one gold top and one blue top
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
## 2019-04-09 NOTE — Discharge Instructions (Signed)
Please read and follow all provided instructions.  Your diagnoses today include:  1. Shortness of breath     Tests performed today include:  An EKG of your heart  A chest x-ray  Cardiac enzymes - a blood test for heart muscle damage  Blood counts and electrolytes  Screening test for blood clots -was negative  Vital signs. See below for your results today.   Medications prescribed:   None  Take any prescribed medications only as directed.  Follow-up instructions: Please follow-up with your primary care provider as soon as you can for further evaluation of your symptoms.   Return instructions:  SEEK IMMEDIATE MEDICAL ATTENTION IF:  You have severe chest pain, especially if the pain is crushing or pressure-like and spreads to the arms, back, neck, or jaw, or if you have sweating, nausea (feeling sick to your stomach), or shortness of breath. THIS IS AN EMERGENCY. Don't wait to see if the pain will go away. Get medical help at once. Call 911 or 0 (operator). DO NOT drive yourself to the hospital.   Your chest pain gets worse and does not go away with rest.   You have an attack of chest pain lasting longer than usual, despite rest and treatment with the medications your caregiver has prescribed.   You wake from sleep with chest pain or shortness of breath.  You feel dizzy or faint.  You have chest pain not typical of your usual pain for which you originally saw your caregiver.   You have any other emergent concerns regarding your health.  Additional Information: Chest pain comes from many different causes. Your caregiver has diagnosed you as having chest pain that is not specific for one problem, but does not require admission.  You are at low risk for an acute heart condition or other serious illness.   Your vital signs today were: BP (!) 142/95    Pulse 74    Temp 98.3 F (36.8 C) (Oral)    Resp 20    SpO2 100%  If your blood pressure (BP) was elevated above 135/85  this visit, please have this repeated by your doctor within one month. --------------

## 2019-04-09 NOTE — ED Triage Notes (Signed)
Pt reports that yesterday was having chest pains with SOB. Today chest pains arent as bad but pt is having dizziness.

## 2019-04-09 NOTE — ED Notes (Addendum)
Patient ambulated in room with this RN, walking 3x around bed. Patient became more dyspnic as she ambulated, but her oxygen saturations on room air remained 97-99%. Patient heart rate jumped from 75SR to 87-90SR. Patient reports her headache increased "feels like its about to burst!" Patient also reports increased dizziness with ambulation. Patient assisted back to bed. Patient repeat VS after returning to bed increased hypertension. Repeat troponin drawn and sent. EDPA made aware.

## 2019-04-10 MED FILL — AMLODIPINE BESYLATE 10 MG T: 10 | 30 days supply | Qty: 30 | Fill #3

## 2019-04-20 MED FILL — HYDROCHLOROTHIAZIDE 12.5 MG: 12.5 | 30 days supply | Qty: 30 | Fill #2

## 2019-04-23 NOTE — Telephone Encounter (Signed)
error 

## 2019-04-29 ENCOUNTER — Encounter (HOSPITAL_COMMUNITY): Payer: Self-pay

## 2019-04-29 ENCOUNTER — Encounter (HOSPITAL_COMMUNITY): Payer: Self-pay | Admitting: *Deleted

## 2019-05-11 MED FILL — AMLODIPINE BESYLATE 10 MG T: 10 | 30 days supply | Qty: 30 | Fill #4

## 2019-05-21 MED FILL — HYDROCHLOROTHIAZIDE 12.5 MG: 12.5 | 30 days supply | Qty: 30 | Fill #3

## 2019-06-19 MED FILL — AMLODIPINE BESYLATE 10 MG T: 10 | 30 days supply | Qty: 30 | Fill #5

## 2019-07-09 ENCOUNTER — Other Ambulatory Visit: Payer: Self-pay

## 2019-07-09 ENCOUNTER — Encounter (HOSPITAL_COMMUNITY): Payer: Self-pay

## 2019-07-09 ENCOUNTER — Ambulatory Visit (HOSPITAL_COMMUNITY)
Admission: EM | Admit: 2019-07-09 | Discharge: 2019-07-09 | Disposition: A | Discharge status: Home / Self Care | Payer: BLUE CROSS/BLUE SHIELD | Attending: Emergency Medicine | Admitting: Emergency Medicine

## 2019-07-09 DIAGNOSIS — M10072 Idiopathic gout, left ankle and foot: Secondary | ICD-10-CM | POA: Diagnosis not present

## 2019-07-09 MED ORDER — PREDNISONE 50 MG PO TABS: ORAL_TABLET | ORAL | 0 refills | Status: DC

## 2019-07-09 MED FILL — predniSONE 10 MG TABS: 10 | 7 days supply | Qty: 27 | Fill #0
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
## 2019-07-09 NOTE — ED Triage Notes (Signed)
Pt. States she has had Gout in her left foot but her pain in the right foot has never stayed this time, she has been in pain since Thursday.

## 2019-07-09 NOTE — Discharge Instructions (Signed)
Begin prednisone taper over the next week- 6 for 2 days then decrease by 1 tab each day until completion ( 6, 6, 5, 4, 3, 2,1) Take with food and in the morning if you are able.   Follow up if pain not improving or worsening, developing increased redness swelling pain or fevers

## 2019-07-13 ENCOUNTER — Other Ambulatory Visit: Payer: Self-pay | Admitting: Family Medicine

## 2019-07-13 DIAGNOSIS — I10 Essential (primary) hypertension: Secondary | ICD-10-CM

## 2019-07-13 MED FILL — HYDROCHLOROTHIAZIDE 12.5 MG: 12.5 | 30 days supply | Qty: 30 | Fill #0

## 2019-07-31 ENCOUNTER — Other Ambulatory Visit: Payer: Self-pay | Admitting: Family Medicine

## 2019-07-31 DIAGNOSIS — I10 Essential (primary) hypertension: Secondary | ICD-10-CM

## 2019-08-03 MED FILL — AMLODIPINE BESYLATE 10 MG T: 10 | 30 days supply | Qty: 30 | Fill #0

## 2019-08-18 MED FILL — HYDROCHLOROTHIAZIDE 12.5 MG: 12.5 | 30 days supply | Qty: 30 | Fill #1

## 2019-08-27 ENCOUNTER — Encounter (HOSPITAL_COMMUNITY): Payer: Self-pay

## 2019-08-27 ENCOUNTER — Ambulatory Visit (HOSPITAL_COMMUNITY)
Admission: EM | Admit: 2019-08-27 | Discharge: 2019-08-27 | Disposition: A | Discharge status: Home / Self Care | Payer: Self-pay | Attending: Physician Assistant | Admitting: Physician Assistant

## 2019-08-27 ENCOUNTER — Other Ambulatory Visit: Payer: Self-pay

## 2019-08-27 DIAGNOSIS — G510 Bell's palsy: Secondary | ICD-10-CM

## 2019-08-27 MED ORDER — PREDNISONE 10 MG PO TABS: ORAL_TABLET | ORAL | 0 refills | Status: AC

## 2019-08-27 MED ORDER — VALACYCLOVIR HCL 1 G PO TABS: 1000.0000 mg | ORAL_TABLET | Freq: Three times a day (TID) | ORAL | 0 refills | Status: DC

## 2019-08-27 MED ORDER — HYPROMELLOSE (GONIOSCOPIC) 2.5 % OP SOLN: 1.0000 [drp] | Freq: Four times a day (QID) | OPHTHALMIC | 12 refills | Status: DC | PRN

## 2019-08-27 NOTE — ED Notes (Signed)
158/95 reported to [employer]

## 2019-08-27 NOTE — ED Notes (Signed)
Spoke to patient in lobby.  Patient reports she has had bells palsy.  Patient reports she thinks this is the same.  Bilateral grips are strong and equal. Patient reports this started yesterday.   Spoke to traci, np about patient and will see

## 2019-08-27 NOTE — Discharge Instructions (Addendum)
Take the prednisone and valtrex as prescribed as directed  I would like for you to wear an eye covering throughout the day until you can fully close your eye, you can purchase an eye patch or utilize tape and gauze.   I would like you place lubricating eye drops in your eye throughout the day.
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
## 2019-08-27 NOTE — ED Triage Notes (Signed)
Pt states she feels like her Bells Palsey is flaring up. Pt states she as face is dropping on the right side. Pt states she a headache and pressure in her right eye. This started yesterday.

## 2019-08-28 DIAGNOSIS — G510 Bell's palsy: Secondary | ICD-10-CM

## 2019-08-28 HISTORY — DX: Bell's palsy: G51.0
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
## 2019-08-31 ENCOUNTER — Ambulatory Visit (INDEPENDENT_AMBULATORY_CARE_PROVIDER_SITE_OTHER): Payer: Self-pay | Admitting: Nurse Practitioner

## 2019-08-31 ENCOUNTER — Other Ambulatory Visit: Payer: Self-pay

## 2019-08-31 ENCOUNTER — Encounter: Payer: Self-pay | Admitting: Nurse Practitioner

## 2019-08-31 VITALS — BP 152/90 | HR 82 | Temp 97.8°F | Resp 16 | Ht 60.0 in | Wt 189.0 lb

## 2019-08-31 DIAGNOSIS — E559 Vitamin D deficiency, unspecified: Secondary | ICD-10-CM

## 2019-08-31 DIAGNOSIS — Z8669 Personal history of other diseases of the nervous system and sense organs: Secondary | ICD-10-CM

## 2019-08-31 DIAGNOSIS — G510 Bell's palsy: Secondary | ICD-10-CM

## 2019-08-31 DIAGNOSIS — R202 Paresthesia of skin: Secondary | ICD-10-CM

## 2019-08-31 DIAGNOSIS — I10 Essential (primary) hypertension: Secondary | ICD-10-CM

## 2019-08-31 LAB — POCT URINALYSIS DIPSTICK
Bilirubin, UA: NEGATIVE
Blood, UA: NEGATIVE
Glucose, UA: NEGATIVE
Ketones, UA: NEGATIVE
Leukocytes, UA: NEGATIVE
Nitrite, UA: NEGATIVE
Protein, UA: NEGATIVE
Spec Grav, UA: 1.02 (ref 1.010–1.025)
Urobilinogen, UA: 0.2 E.U./dL
pH, UA: 6 (ref 5.0–8.0)

## 2019-08-31 NOTE — Patient Instructions (Signed)
Bell Palsy, Adult  Bell palsy is a short-term inability to move muscles in part of the face. The inability to move (paralysis) results from inflammation or compression of the facial nerve, which travels along the skull and under the ear to the side of the face (7th cranial nerve). This nerve is responsible for facial movements that include blinking, closing the eyes, smiling, and frowning. What are the causes? The exact cause of this condition is not known. It may be caused by an infection from a virus, such as the chickenpox (herpes zoster), Epstein-Barr, or mumps virus. What increases the risk? You are more likely to develop this condition if:  You are pregnant.  You have diabetes.  You have had a recent infection in your nose, throat, or airways (upper respiratory infection).  You have a weakened body defense system (immune system).  You have had a facial injury, such as a fracture.  You have a family history of Bell palsy. What are the signs or symptoms? Symptoms of this condition include:  Weakness on one side of the face.  Drooping eyelid and corner of the mouth.  Excessive tearing in one eye.  Difficulty closing the eyelid.  Dry eye.  Drooling.  Dry mouth.  Changes in taste.  Change in facial appearance.  Pain behind one ear.  Ringing in one or both ears.  Sensitivity to sound in one ear.  Facial twitching.  Headache.  Impaired speech.  Dizziness.  Difficulty eating or drinking. Most of the time, only one side of the face is affected. Rarely, Bell palsy affects the whole face. How is this diagnosed? This condition is diagnosed based on:  Your symptoms.  Your medical history.  A physical exam. You may also have to see health care providers who specialize in disorders of the nerves (neurologist) or diseases and conditions of the eye (ophthalmologist). You may have tests, such as:  A test to check for nerve damage (electromyogram).  Imaging  studies, such as CT or MRI scans.  Blood tests. How is this treated? This condition affects every person differently. Sometimes symptoms go away without treatment within a couple weeks. If treatment is needed, it varies from person to person. The goal of treatment is to reduce inflammation and protect the eye from damage. Treatment for Bell palsy may include:  Medicines, such as: ? Steroids to reduce swelling and inflammation. ? Antiviral drugs. ? Pain relievers, including aspirin, acetaminophen, or ibuprofen.  Eye drops or ointment to keep your eye moist.  Eye protection, if you cannot close your eye.  Exercises or massage to regain muscle strength and function (physical therapy). Follow these instructions at home:   Take over-the-counter and prescription medicines only as told by your health care provider.  If your eye is affected: ? Keep your eye moist with eye drops or ointment as told by your health care provider. ? Follow instructions for eye care and protection as told by your health care provider.  Do any physical therapy exercises as told by your health care provider.  Keep all follow-up visits as told by your health care provider. This is important. Contact a health care provider if:  You have a fever.  Your symptoms do not get better within 2-3 weeks, or your symptoms get worse.  Your eye is red, irritated, or painful.  You have new symptoms. Get help right away if:  You have weakness or numbness in a part of your body other than your face.  You have   trouble swallowing.  You develop neck pain or stiffness.  You develop dizziness or shortness of breath. Summary  Bell palsy is a short-term inability to move muscles in part of the face. The inability to move (paralysis) results from inflammation or compression of the facial nerve.  This condition affects every person differently. Sometimes symptoms go away without treatment within a couple weeks.  If  treatment is needed, it varies from person to person. The goal of treatment is to reduce inflammation and protect the eye from damage.  Contact your health care provider if your symptoms do not get better within 2-3 weeks, or your symptoms get worse. This information is not intended to replace advice given to you by your health care provider. Make sure you discuss any questions you have with your health care provider. Document Revised: 07/19/2017 Document Reviewed: 10/09/2016 Elsevier Patient Education  2020 Elsevier Inc.  

## 2019-09-05 LAB — COMP. METABOLIC PANEL (12)
AST: 11 IU/L (ref 0–40)
Albumin/Globulin Ratio: 1.5 (ref 1.2–2.2)
Albumin: 4.8 g/dL (ref 3.8–4.8)
Alkaline Phosphatase: 82 IU/L (ref 39–117)
BUN/Creatinine Ratio: 24 (ref 12–28)
BUN: 15 mg/dL (ref 8–27)
Bilirubin Total: <0.2 mg/dL (ref 0.0–1.2)
Calcium: 10 mg/dL (ref 8.7–10.3)
Chloride: 104 mmol/L (ref 96–106)
Creatinine, Ser: 0.62 mg/dL (ref 0.57–1.00)
GFR calc Af Amer: 112 mL/min/{1.73_m2} (ref >59)
GFR calc non Af Amer: 97 mL/min/{1.73_m2} (ref >59)
Globulin, Total: 3.1 g/dL (ref 1.5–4.5)
Glucose: 108 mg/dL — ABNORMAL HIGH (ref 65–99)
Potassium: 4.1 mmol/L (ref 3.5–5.2)
Sodium: 140 mmol/L (ref 134–144)
Total Protein: 7.9 g/dL (ref 6.0–8.5)

## 2019-09-05 LAB — CBC WITH DIFFERENTIAL/PLATELET
Basophils Absolute: 0 10*3/uL (ref 0.0–0.2)
Basos: 0 %
EOS (ABSOLUTE): 0 10*3/uL (ref 0.0–0.4)
Eos: 0 %
Hematocrit: 41.5 % (ref 34.0–46.6)
Hemoglobin: 13.8 g/dL (ref 11.1–15.9)
Immature Grans (Abs): 0 10*3/uL (ref 0.0–0.1)
Immature Granulocytes: 1 %
Lymphocytes Absolute: 1.2 10*3/uL (ref 0.7–3.1)
Lymphs: 14 %
MCH: 29.4 pg (ref 26.6–33.0)
MCHC: 33.3 g/dL (ref 31.5–35.7)
MCV: 88 fL (ref 79–97)
Monocytes Absolute: 0.6 10*3/uL (ref 0.1–0.9)
Monocytes: 8 %
Neutrophils Absolute: 6.5 10*3/uL (ref 1.4–7.0)
Neutrophils: 77 %
Platelets: 306 10*3/uL (ref 150–450)
RBC: 4.7 x10E6/uL (ref 3.77–5.28)
RDW: 12.9 % (ref 11.7–15.4)
WBC: 8.4 10*3/uL (ref 3.4–10.8)

## 2019-09-05 LAB — MAGNESIUM: Magnesium: 2.4 mg/dL — ABNORMAL HIGH (ref 1.6–2.3)

## 2019-09-05 LAB — 25-HYDROXY VITAMIN D LCMS D2+D3
25-Hydroxy, Vitamin D-2: 2.5 ng/mL
25-Hydroxy, Vitamin D-3: 5.2 ng/mL
25-Hydroxy, Vitamin D: 7.7 ng/mL — ABNORMAL LOW

## 2019-09-05 LAB — VITAMIN B12: Vitamin B-12: 279 pg/mL (ref 232–1245)

## 2019-09-07 ENCOUNTER — Other Ambulatory Visit: Payer: Self-pay | Admitting: Nurse Practitioner

## 2019-09-07 ENCOUNTER — Telehealth: Payer: Self-pay

## 2019-09-07 MED ORDER — ERGOCALCIFEROL 1.25 MG (50000 UT) PO CAPS: 50000.0000 [IU] | ORAL_CAPSULE | ORAL | 0 refills | Status: AC

## 2019-09-07 MED FILL — VIT D2 1.25 MG (50,000 UNIT: 1.25 MG | 84 days supply | Qty: 12 | Fill #0

## 2019-09-07 NOTE — Telephone Encounter (Signed)
Called, Spoke with patient, advised that vitamin D was low and to start taking once weekly vitamin D. Advised that once complete to take otc vitamin D 2000 units daily. Patient verbalized understanding. Thanks!

## 2019-09-07 NOTE — Telephone Encounter (Signed)
-----   Message from Vevelyn Francois, NP sent at 09/07/2019  8:28 AM EST ----- Please make patient aware that her vitamin D is 7.7.  She has vitamin D deficiency I will start her on the 50,000 units weekly after completing this prescription she needs to start taking daily vitamin D at least 2000 units.  All other labs are within normal range.

## 2019-09-08 MED FILL — AMLODIPINE BESYLATE 10 MG T: 10 | 30 days supply | Qty: 30 | Fill #1

## 2019-09-14 ENCOUNTER — Encounter: Payer: Self-pay | Admitting: Nurse Practitioner

## 2019-09-14 ENCOUNTER — Ambulatory Visit (INDEPENDENT_AMBULATORY_CARE_PROVIDER_SITE_OTHER): Payer: Self-pay | Admitting: Nurse Practitioner

## 2019-09-14 ENCOUNTER — Other Ambulatory Visit: Payer: Self-pay

## 2019-09-14 VITALS — BP 130/85 | HR 83 | Temp 98.6°F | Resp 16 | Ht 60.0 in | Wt 187.0 lb

## 2019-09-14 DIAGNOSIS — G510 Bell's palsy: Secondary | ICD-10-CM
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
## 2019-09-22 MED FILL — HYDROCHLOROTHIAZIDE 12.5 MG: 12.5 | 30 days supply | Qty: 30 | Fill #2

## 2019-09-24 ENCOUNTER — Other Ambulatory Visit: Payer: Self-pay | Admitting: Nurse Practitioner

## 2019-09-24 ENCOUNTER — Telehealth: Payer: Self-pay | Admitting: Nurse Practitioner

## 2019-09-24 DIAGNOSIS — E782 Mixed hyperlipidemia: Secondary | ICD-10-CM

## 2019-09-24 NOTE — Telephone Encounter (Signed)
Pt wants to know if she has to fast for tomorrows appointment. Please call pt back

## 2019-09-25 ENCOUNTER — Ambulatory Visit: Payer: Self-pay

## 2019-09-25 ENCOUNTER — Other Ambulatory Visit: Payer: Self-pay

## 2019-09-25 ENCOUNTER — Encounter: Payer: Self-pay | Admitting: Nurse Practitioner

## 2019-09-25 VITALS — BP 123/79 | HR 76 | Temp 97.8°F | Resp 14 | Ht 60.0 in | Wt 190.0 lb

## 2019-09-25 DIAGNOSIS — E782 Mixed hyperlipidemia: Secondary | ICD-10-CM

## 2019-09-26 LAB — LIPID PANEL
Chol/HDL Ratio: 3.9 ratio (ref 0.0–4.4)
Cholesterol, Total: 225 mg/dL — ABNORMAL HIGH (ref 100–199)
HDL: 58 mg/dL (ref >39)
LDL Chol Calc (NIH): 147 mg/dL — ABNORMAL HIGH (ref 0–99)
Triglycerides: 114 mg/dL (ref 0–149)
VLDL Cholesterol Cal: 20 mg/dL (ref 5–40)

## 2019-09-28 ENCOUNTER — Telehealth: Payer: Self-pay

## 2019-09-28 NOTE — Telephone Encounter (Signed)
Called and spoke with patient, advised that total and bad cholesterol was elevated and advised her to watch her diet and avoid fried fatty foods.

## 2019-09-28 NOTE — Telephone Encounter (Signed)
-----   Message from Vevelyn Francois, NP sent at 09/28/2019  2:06 PM EST ----- Please let Ms. Offord aware that her total cholesterol is 225 and the LDL is 147.  However to make sure that she is watching her fat and cholesterol intake.  Make sure that she has a 65-month follow-up appointment thanks

## 2019-10-09 ENCOUNTER — Other Ambulatory Visit: Payer: Self-pay

## 2019-10-09 DIAGNOSIS — I10 Essential (primary) hypertension: Secondary | ICD-10-CM

## 2019-10-09 MED ORDER — AMLODIPINE BESYLATE 10 MG PO TABS: 10.0000 mg | ORAL_TABLET | Freq: Every day | ORAL | 1 refills | Status: DC

## 2019-10-09 MED FILL — AMLODIPINE BESYLATE 10 MG T: 10 | 30 days supply | Qty: 30 | Fill #0

## 2019-10-12 ENCOUNTER — Encounter: Payer: Self-pay | Admitting: Diagnostic Neuroimaging

## 2019-10-12 ENCOUNTER — Ambulatory Visit: Payer: 59 | Admitting: Diagnostic Neuroimaging

## 2019-10-12 ENCOUNTER — Other Ambulatory Visit: Payer: Self-pay

## 2019-10-27 MED FILL — HYDROCHLOROTHIAZIDE 12.5 MG: 12.5 | 30 days supply | Qty: 30 | Fill #3

## 2019-11-11 ENCOUNTER — Encounter: Payer: Self-pay | Admitting: Diagnostic Neuroimaging

## 2019-11-11 ENCOUNTER — Telehealth: Payer: Self-pay | Admitting: Diagnostic Neuroimaging

## 2019-11-11 ENCOUNTER — Ambulatory Visit: Payer: 59 | Admitting: Diagnostic Neuroimaging

## 2019-11-11 ENCOUNTER — Other Ambulatory Visit: Payer: Self-pay

## 2019-11-11 VITALS — BP 136/91 | HR 76 | Temp 97.4°F | Ht 60.0 in | Wt 188.8 lb

## 2019-11-11 DIAGNOSIS — G510 Bell's palsy: Secondary | ICD-10-CM

## 2019-11-11 MED FILL — AMLODIPINE BESYLATE 10 MG T: 10 | 30 days supply | Qty: 30 | Fill #1

## 2019-11-11 NOTE — Patient Instructions (Signed)
-   check labs and MRI  - follow up with PCP and consider ENT (for left ear)

## 2019-11-11 NOTE — Telephone Encounter (Signed)
[facility] auth: [id number] (exp. 11/11/19 to 05/13/20) order sent to GI. They will reach out to the patient to schedule.
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
## 2019-11-12 LAB — SJOGREN'S SYNDROME ANTIBODS(SSA + SSB)
ENA SSA (RO) Ab: 3.8 AI — ABNORMAL HIGH (ref 0.0–0.9)
ENA SSB (LA) Ab: <0.2 AI (ref 0.0–0.9)

## 2019-11-12 LAB — HIV ANTIBODY (ROUTINE TESTING W REFLEX): HIV Screen 4th Generation wRfx: NONREACTIVE

## 2019-11-12 LAB — ANGIOTENSIN CONVERTING ENZYME: Angio Convert Enzyme: 29 U/L (ref 14–82)

## 2019-11-12 LAB — SAR COV2 SEROLOGY (COVID19)AB(IGG),IA: DiaSorin SARS-CoV-2 Ab, IgG: NEGATIVE

## 2019-11-16 ENCOUNTER — Telehealth: Payer: Self-pay | Admitting: *Deleted

## 2019-11-16 NOTE — Telephone Encounter (Signed)
Slightly positive SSA level. Other labs ok. Consider rheum consult. -VRP

## 2019-11-17 NOTE — Telephone Encounter (Signed)
LVM informing her that most labs are ok. She has elevated SSA which may indicate autoimmune disease. Dr Stark Klein recommends she consider rheumatology consult. Also advised she needs to call [facility] and schedule MRI. requested she call  back, left #.

## 2019-12-07 MED FILL — HYDROCHLOROTHIAZIDE 12.5 MG: 12.5 | 30 days supply | Qty: 30 | Fill #4

## 2019-12-17 ENCOUNTER — Other Ambulatory Visit: Payer: Self-pay | Admitting: Nurse Practitioner

## 2019-12-17 DIAGNOSIS — I10 Essential (primary) hypertension: Secondary | ICD-10-CM

## 2019-12-17 MED FILL — AMLODIPINE BESYLATE 10 MG T: 10 | 30 days supply | Qty: 30 | Fill #0

## 2019-12-18 ENCOUNTER — Other Ambulatory Visit: Payer: 59

## 2019-12-23 ENCOUNTER — Ambulatory Visit (INDEPENDENT_AMBULATORY_CARE_PROVIDER_SITE_OTHER): Payer: 59 | Admitting: Nurse Practitioner

## 2019-12-23 ENCOUNTER — Other Ambulatory Visit: Payer: Self-pay

## 2019-12-23 ENCOUNTER — Encounter: Payer: Self-pay | Admitting: Nurse Practitioner

## 2019-12-23 VITALS — BP 137/67 | HR 71 | Ht 60.0 in | Wt 186.4 lb

## 2019-12-23 DIAGNOSIS — E782 Mixed hyperlipidemia: Secondary | ICD-10-CM | POA: Diagnosis not present

## 2019-12-23 DIAGNOSIS — E669 Obesity, unspecified: Secondary | ICD-10-CM

## 2019-12-23 DIAGNOSIS — G8929 Other chronic pain: Secondary | ICD-10-CM

## 2019-12-23 DIAGNOSIS — G510 Bell's palsy: Secondary | ICD-10-CM

## 2019-12-23 DIAGNOSIS — M5442 Lumbago with sciatica, left side: Secondary | ICD-10-CM

## 2019-12-23 DIAGNOSIS — I10 Essential (primary) hypertension: Secondary | ICD-10-CM | POA: Diagnosis not present

## 2019-12-23 MED ORDER — METHOCARBAMOL 500 MG PO TABS: 500.0000 mg | ORAL_TABLET | Freq: Three times a day (TID) | ORAL | 0 refills | Status: DC | PRN

## 2019-12-23 MED FILL — METHOCARBAMOL 500 MG TABS: 500 | 30 days supply | Qty: 90 | Fill #0
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
## 2019-12-23 NOTE — Patient Instructions (Signed)

## 2019-12-24 LAB — LIPID PANEL
Chol/HDL Ratio: 3.8 ratio (ref 0.0–4.4)
Cholesterol, Total: 203 mg/dL — ABNORMAL HIGH (ref 100–199)
HDL: 53 mg/dL (ref >39)
LDL Chol Calc (NIH): 133 mg/dL — ABNORMAL HIGH (ref 0–99)
Triglycerides: 97 mg/dL (ref 0–149)
VLDL Cholesterol Cal: 17 mg/dL (ref 5–40)

## 2019-12-24 LAB — COMP. METABOLIC PANEL (12)
AST: 17 IU/L (ref 0–40)
Albumin/Globulin Ratio: 1.8 (ref 1.2–2.2)
Albumin: 4.5 g/dL (ref 3.8–4.8)
Alkaline Phosphatase: 84 IU/L (ref 39–117)
BUN/Creatinine Ratio: 24 (ref 12–28)
BUN: 15 mg/dL (ref 8–27)
Bilirubin Total: 0.3 mg/dL (ref 0.0–1.2)
Calcium: 9.5 mg/dL (ref 8.7–10.3)
Chloride: 101 mmol/L (ref 96–106)
Creatinine, Ser: 0.63 mg/dL (ref 0.57–1.00)
GFR calc Af Amer: 111 mL/min/{1.73_m2} (ref >59)
GFR calc non Af Amer: 96 mL/min/{1.73_m2} (ref >59)
Globulin, Total: 2.5 g/dL (ref 1.5–4.5)
Glucose: 108 mg/dL — ABNORMAL HIGH (ref 65–99)
Potassium: 3.7 mmol/L (ref 3.5–5.2)
Sodium: 139 mmol/L (ref 134–144)
Total Protein: 7 g/dL (ref 6.0–8.5)

## 2019-12-30 ENCOUNTER — Encounter: Payer: Self-pay | Admitting: Nurse Practitioner

## 2019-12-30 ENCOUNTER — Telehealth: Payer: Self-pay | Admitting: Nurse Practitioner

## 2019-12-30 ENCOUNTER — Other Ambulatory Visit: Payer: Self-pay | Admitting: Nurse Practitioner

## 2019-12-30 DIAGNOSIS — G8929 Other chronic pain: Secondary | ICD-10-CM

## 2019-12-30 NOTE — Telephone Encounter (Signed)
Called spoke with pt she said that she has gone back to work but she having a lot back pain when lifting and bending , she wants to know if can have note for light duty also wanted to know if she have different muscle relaxer to take during the day because the robaxin it strong and makes her sleepy.

## 2019-12-30 NOTE — Telephone Encounter (Signed)
All muscle relaxers make you sleepy, it is the nature of the medication. I provide her a note but they may want additional detail and if so she will have to be referred to physical therapy for a Functional Capacity Evaluation at that time.

## 2019-12-30 NOTE — Telephone Encounter (Signed)
Ok done

## 2019-12-30 NOTE — Telephone Encounter (Signed)
Called spoke with patient  she said that she is will to PT for functional capacity and Evaluation. Also informed her of muscle  relaxer can causes to be sleepy , that she can try otc Tylenol to help. Pt is going come by the office to put letter.

## 2019-12-30 NOTE — Telephone Encounter (Signed)
Pt wants a call back regarding pulled muscles in back. She needs an explanation for work as to why she cant do certain things. Please call pt back

## 2020-01-08 MED FILL — HYDROCHLOROTHIAZIDE 12.5 MG: 12.5 | 30 days supply | Qty: 30 | Fill #5

## 2020-01-19 MED FILL — AMLODIPINE BESYLATE 10 MG T: 10 | 30 days supply | Qty: 30 | Fill #1

## 2020-02-15 ENCOUNTER — Other Ambulatory Visit: Payer: Self-pay | Admitting: Internal Medicine

## 2020-02-15 DIAGNOSIS — I10 Essential (primary) hypertension: Secondary | ICD-10-CM

## 2020-02-17 ENCOUNTER — Telehealth: Payer: Self-pay | Admitting: Family Medicine

## 2020-02-18 ENCOUNTER — Other Ambulatory Visit: Payer: Self-pay | Admitting: Family Medicine

## 2020-02-18 DIAGNOSIS — I10 Essential (primary) hypertension: Secondary | ICD-10-CM

## 2020-02-18 MED ORDER — HYDROCHLOROTHIAZIDE 12.5 MG PO CAPS: 12.5000 mg | ORAL_CAPSULE | Freq: Every day | ORAL | 5 refills | Status: DC

## 2020-02-19 MED FILL — HYDROCHLOROTHIAZIDE 12.5 MG: 12.5 | 30 days supply | Qty: 30 | Fill #0

## 2020-02-24 NOTE — Telephone Encounter (Signed)
Done

## 2020-03-02 ENCOUNTER — Other Ambulatory Visit: Payer: Self-pay | Admitting: Nurse Practitioner

## 2020-03-02 DIAGNOSIS — I10 Essential (primary) hypertension: Secondary | ICD-10-CM

## 2020-03-02 MED FILL — AMLODIPINE BESYLATE 10 MG T: 10 | 30 days supply | Qty: 30 | Fill #0

## 2020-03-23 ENCOUNTER — Encounter: Payer: Self-pay | Admitting: Nurse Practitioner

## 2020-03-23 ENCOUNTER — Other Ambulatory Visit: Payer: Self-pay

## 2020-03-23 ENCOUNTER — Ambulatory Visit (HOSPITAL_COMMUNITY)
Admission: RE | Admit: 2020-03-23 | Discharge: 2020-03-23 | Disposition: A | Discharge status: Home / Self Care | Payer: 59 | Source: Ambulatory Visit | Attending: Nurse Practitioner | Admitting: Nurse Practitioner

## 2020-03-23 ENCOUNTER — Ambulatory Visit (INDEPENDENT_AMBULATORY_CARE_PROVIDER_SITE_OTHER): Payer: 59 | Admitting: Nurse Practitioner

## 2020-03-23 VITALS — BP 119/82 | HR 83 | Temp 98.7°F | Resp 16 | Ht 60.0 in | Wt 186.0 lb

## 2020-03-23 DIAGNOSIS — M10472 Other secondary gout, left ankle and foot: Secondary | ICD-10-CM

## 2020-03-23 DIAGNOSIS — M25571 Pain in right ankle and joints of right foot: Secondary | ICD-10-CM | POA: Diagnosis not present

## 2020-03-23 DIAGNOSIS — I10 Essential (primary) hypertension: Secondary | ICD-10-CM

## 2020-03-23 DIAGNOSIS — G8929 Other chronic pain: Secondary | ICD-10-CM

## 2020-03-23 DIAGNOSIS — M5442 Lumbago with sciatica, left side: Secondary | ICD-10-CM | POA: Diagnosis not present

## 2020-03-23 LAB — POCT URINALYSIS DIPSTICK
Bilirubin, UA: NEGATIVE
Blood, UA: NEGATIVE
Glucose, UA: NEGATIVE
Ketones, UA: NEGATIVE
Leukocytes, UA: NEGATIVE
Nitrite, UA: POSITIVE
Protein, UA: NEGATIVE
Spec Grav, UA: 1.025 (ref 1.010–1.025)
Urobilinogen, UA: 0.2 E.U./dL
pH, UA: 5 (ref 5.0–8.0)

## 2020-03-23 MED ORDER — AMLODIPINE BESYLATE 10 MG PO TABS: 10.0000 mg | ORAL_TABLET | Freq: Every day | ORAL | 11 refills | Status: DC

## 2020-03-23 MED ORDER — INDOMETHACIN 50 MG PO CAPS
50.0000 mg | ORAL_CAPSULE | Freq: Three times a day (TID) | ORAL | 0 refills | Status: DC
Start: 2020-03-23 — End: 2020-06-09

## 2020-03-23 MED ORDER — METHOCARBAMOL 500 MG PO TABS
500.0000 mg | ORAL_TABLET | Freq: Three times a day (TID) | ORAL | 0 refills | Status: DC | PRN
  Filled 2021-01-17: qty 90, 30d supply, fill #0

## 2020-03-23 MED FILL — METHOCARBAMOL 500 MG TABS: 500 | 30 days supply | Qty: 90 | Fill #0

## 2020-03-23 MED FILL — INDOMETHACIN 50 MG CAPSULE: 50 | 30 days supply | Qty: 90 | Fill #0
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
## 2020-03-24 ENCOUNTER — Encounter: Payer: Self-pay | Admitting: Nurse Practitioner

## 2020-03-24 LAB — COMP. METABOLIC PANEL (12)
AST: 15 IU/L (ref 0–40)
Albumin/Globulin Ratio: 1.3 (ref 1.2–2.2)
Albumin: 4.3 g/dL (ref 3.8–4.8)
Alkaline Phosphatase: 93 IU/L (ref 48–121)
BUN/Creatinine Ratio: 25 (ref 12–28)
BUN: 15 mg/dL (ref 8–27)
Bilirubin Total: <0.2 mg/dL (ref 0.0–1.2)
Calcium: 9.5 mg/dL (ref 8.7–10.3)
Chloride: 105 mmol/L (ref 96–106)
Creatinine, Ser: 0.6 mg/dL (ref 0.57–1.00)
GFR calc Af Amer: 113 mL/min/{1.73_m2} (ref >59)
GFR calc non Af Amer: 98 mL/min/{1.73_m2} (ref >59)
Globulin, Total: 3.2 g/dL (ref 1.5–4.5)
Glucose: 97 mg/dL (ref 65–99)
Potassium: 4 mmol/L (ref 3.5–5.2)
Sodium: 142 mmol/L (ref 134–144)
Total Protein: 7.5 g/dL (ref 6.0–8.5)

## 2020-03-24 LAB — URIC ACID: Uric Acid: 7.5 mg/dL — ABNORMAL HIGH (ref 3.0–7.2)

## 2020-03-24 NOTE — Progress Notes (Signed)
Please make Kaylee [name] aware that there is no acute injury to her ankle. However if there pain does not improve there is a recommendation for MRI for further evaluation of an area that has changed since 2008 which maybe an old fracture.

## 2020-03-25 ENCOUNTER — Telehealth: Payer: Self-pay

## 2020-03-25 NOTE — Telephone Encounter (Signed)
Pt advised. Thanks!

## 2020-03-25 NOTE — Telephone Encounter (Signed)
-----   Message from Vevelyn Francois, NP sent at 03/24/2020  4:03 PM EDT ----- Please make Kaylee [name] aware that there is no acute injury to her ankle. However if there pain does not improve there is a recommendation for MRI for further evaluation of an area that has changed since 2008 which maybe an old fracture.

## 2020-03-28 MED FILL — HYDROCHLOROTHIAZIDE 12.5 MG: 12.5 | 30 days supply | Qty: 30 | Fill #1

## 2020-04-04 ENCOUNTER — Encounter (HOSPITAL_COMMUNITY): Payer: Self-pay | Admitting: Emergency Medicine

## 2020-04-04 ENCOUNTER — Other Ambulatory Visit: Payer: Self-pay

## 2020-04-04 ENCOUNTER — Ambulatory Visit (HOSPITAL_COMMUNITY)
Admission: EM | Admit: 2020-04-04 | Discharge: 2020-04-04 | Disposition: A | Discharge status: Home / Self Care | Payer: 59 | Attending: Family Medicine | Admitting: Family Medicine

## 2020-04-04 DIAGNOSIS — L299 Pruritus, unspecified: Secondary | ICD-10-CM

## 2020-04-04 DIAGNOSIS — T63441A Toxic effect of venom of bees, accidental (unintentional), initial encounter: Secondary | ICD-10-CM

## 2020-04-04 MED ORDER — METHYLPREDNISOLONE SODIUM SUCC 125 MG IJ SOLR
INTRAMUSCULAR | Status: AC
  Filled 2020-04-04: qty 2

## 2020-04-04 MED ORDER — METHYLPREDNISOLONE SODIUM SUCC 125 MG IJ SOLR
125.0000 mg | Freq: Once | INTRAMUSCULAR | Status: AC
  Administered 2020-04-04: 125 mg via INTRAMUSCULAR

## 2020-04-04 MED ORDER — PREDNISONE 20 MG PO TABS
40.0000 mg | ORAL_TABLET | Freq: Every day | ORAL | 0 refills | Status: DC
Start: 2020-04-04 — End: 2021-12-27

## 2020-04-04 MED FILL — predniSONE 20 MG TABS: 20 | 4 days supply | Qty: 8 | Fill #0
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
## 2020-04-04 NOTE — ED Triage Notes (Signed)
Pt presents with multiple bee stings xs 2 days. States has used benadryl and hydrocortisone cream with no relief.

## 2020-04-04 NOTE — Discharge Instructions (Addendum)
Meds ordered this encounter  Medications   methylPREDNISolone sodium succinate (SOLU-MEDROL) 125 mg/2 mL injection 125 mg   predniSONE (DELTASONE) 20 MG tablet    Sig: Take 2 tablets (40 mg total) by mouth daily.    Dispense:  8 tablet    Refill:  0

## 2020-04-05 MED FILL — AMLODIPINE BESYLATE 10 MG T: 10 | 30 days supply | Qty: 30 | Fill #1

## 2020-05-02 MED FILL — HYDROCHLOROTHIAZIDE 12.5 MG: 12.5 | 30 days supply | Qty: 30 | Fill #2

## 2020-05-13 MED FILL — AMLODIPINE BESYLATE 10 MG T: 10 | 30 days supply | Qty: 30 | Fill #0

## 2020-06-09 ENCOUNTER — Other Ambulatory Visit: Payer: Self-pay | Admitting: Nurse Practitioner

## 2020-06-09 MED FILL — HYDROCHLOROTHIAZIDE 12.5 MG: 12.5 | 30 days supply | Qty: 30 | Fill #3

## 2020-06-09 MED FILL — AMLODIPINE BESYLATE 10 MG T: 10 | 30 days supply | Qty: 30 | Fill #1

## 2020-06-09 MED FILL — INDOMETHACIN 50 MG CAPSULE: 50 | 30 days supply | Qty: 90 | Fill #0

## 2020-06-30 ENCOUNTER — Telehealth: Payer: Self-pay | Admitting: Nurse Practitioner

## 2020-07-01 ENCOUNTER — Encounter: Payer: Self-pay | Admitting: Nurse Practitioner

## 2020-07-04 NOTE — Telephone Encounter (Signed)
Sent to provider 

## 2020-07-10 IMAGING — CR CHEST - 2 VIEW
2 series · 2 of 2 positions shown · non-contrast
Comparison: 11/19/2016

CLINICAL DATA: Mid chest pain and shortness of breath since
yesterday, hypertension

EXAM:
CHEST - 2 VIEW

[w chest pa]
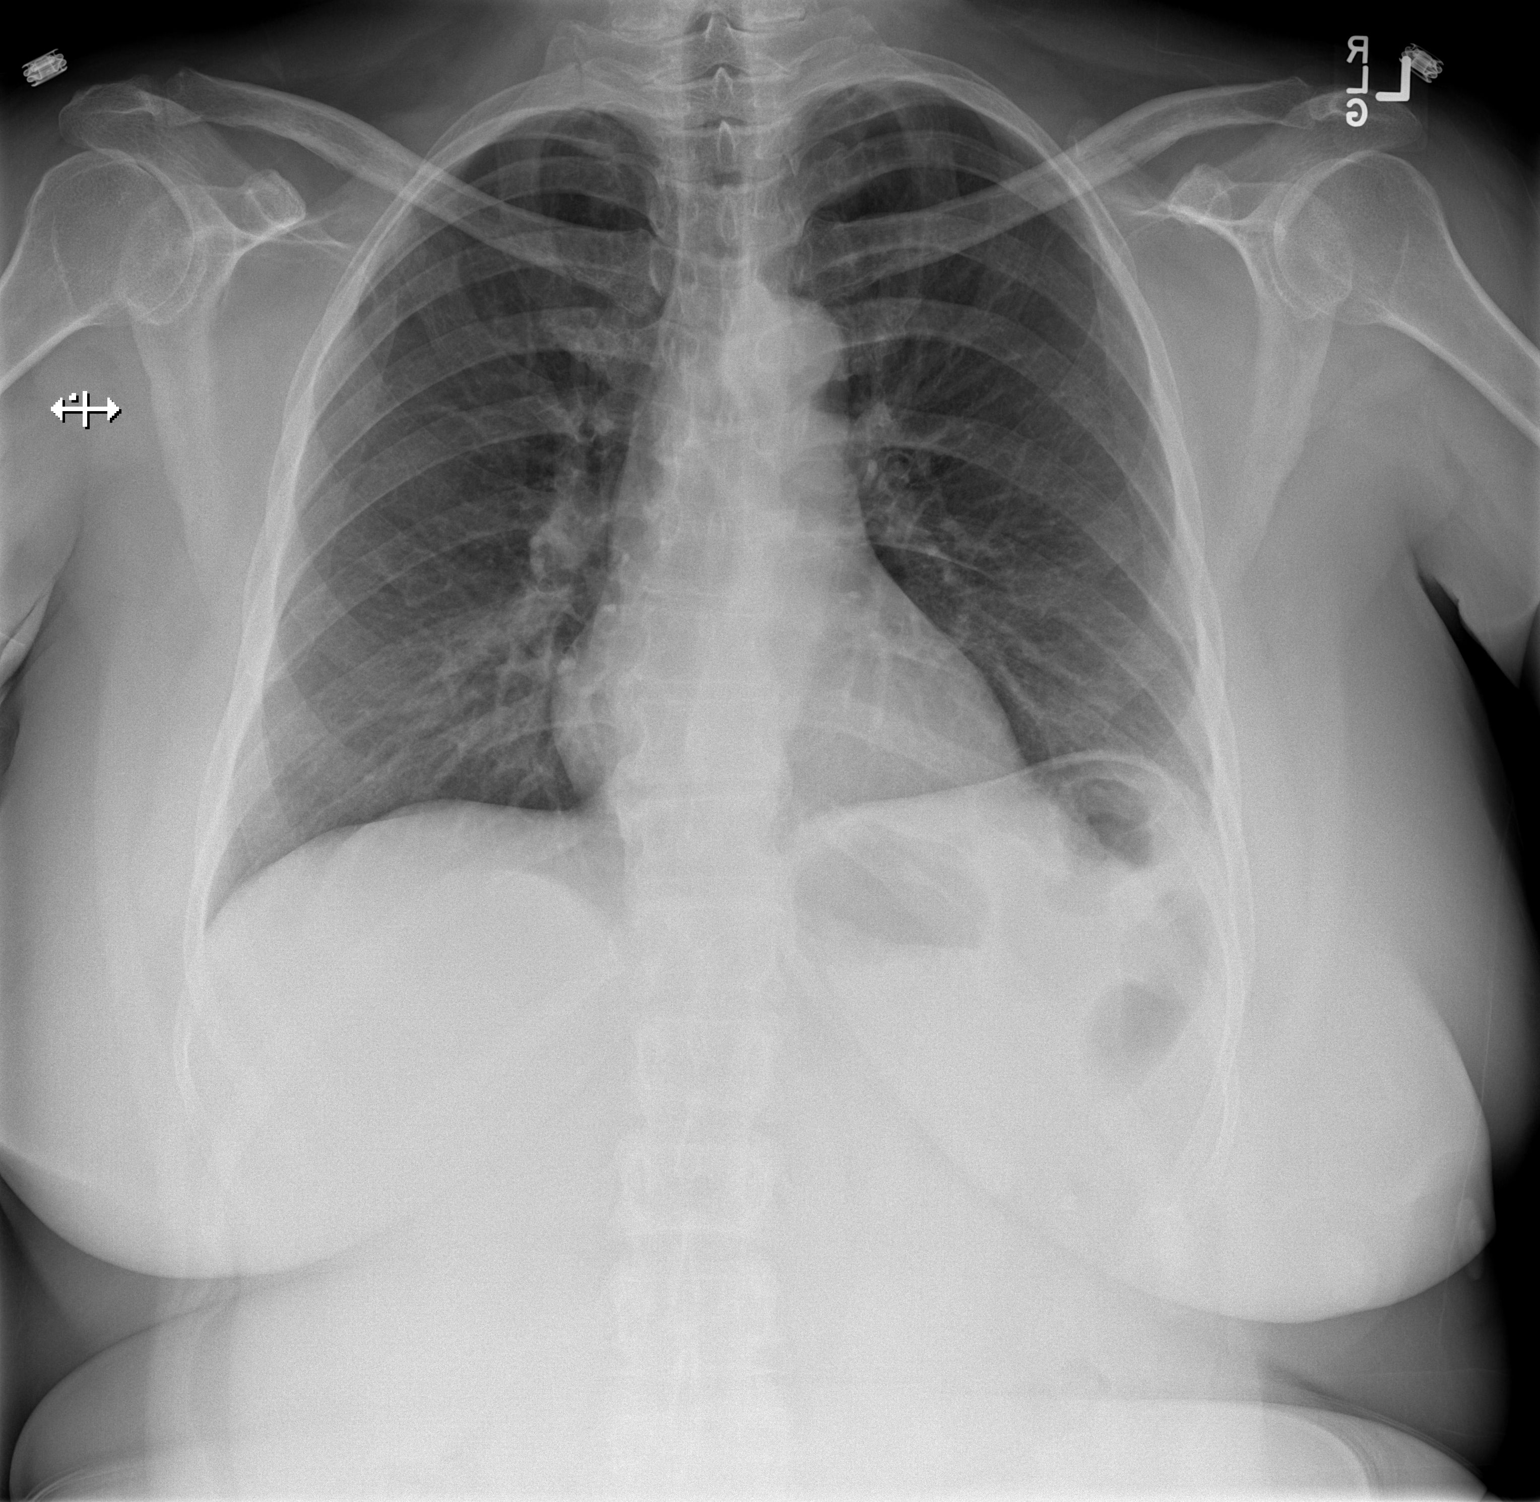

[w chest lat]
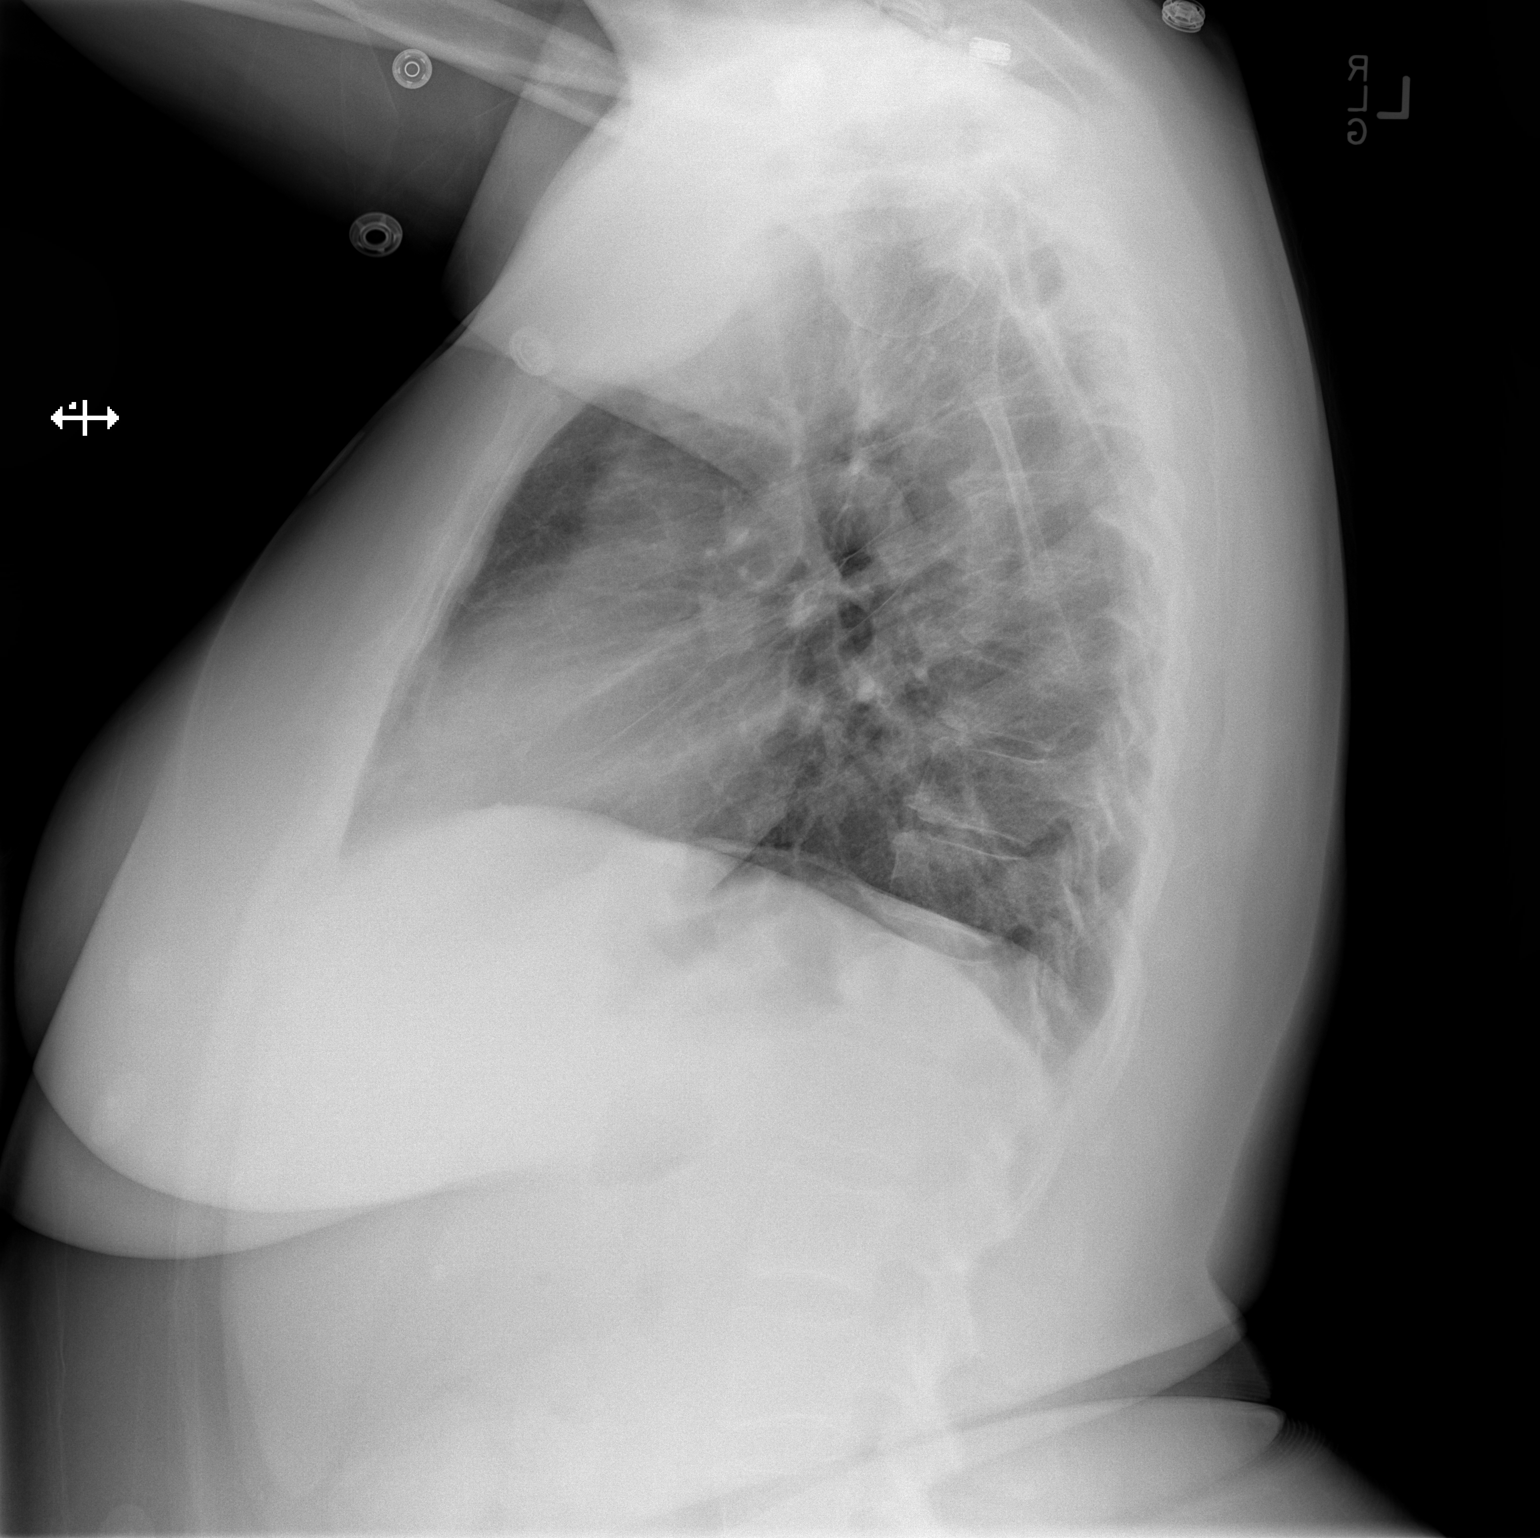

[2 of 2 positions shown; findings below may reference images not displayed]

FINDINGS: Normal heart size, mediastinal contours, and pulmonary vascularity.

Mild chronic bronchitic changes.

Lungs otherwise clear.

No pleural effusion or pneumothorax.

Bones unremarkable.
IMPRESSION: Mild chronic bronchitic changes without infiltrate.

## 2020-08-01 MED FILL — HYDROCHLOROTHIAZIDE 12.5 MG: 12.5 | 30 days supply | Qty: 30 | Fill #4

## 2020-08-01 MED FILL — AMLODIPINE BESYLATE 10 MG T: 10 | 30 days supply | Qty: 30 | Fill #2

## 2020-09-12 ENCOUNTER — Other Ambulatory Visit: Payer: Self-pay | Admitting: Nurse Practitioner

## 2020-09-12 ENCOUNTER — Telehealth: Payer: Self-pay | Admitting: Nurse Practitioner

## 2020-09-12 DIAGNOSIS — I10 Essential (primary) hypertension: Secondary | ICD-10-CM

## 2020-09-12 MED ORDER — AMLODIPINE BESYLATE 10 MG PO TABS: 10.0000 mg | ORAL_TABLET | Freq: Every day | ORAL | 3 refills | Status: DC

## 2020-09-12 MED ORDER — HYDROCHLOROTHIAZIDE 12.5 MG PO CAPS: 12.5000 mg | ORAL_CAPSULE | Freq: Every day | ORAL | 3 refills | Status: DC

## 2020-09-12 NOTE — Telephone Encounter (Signed)
Will send but she needs a follow up apt also thanks

## 2021-01-17 ENCOUNTER — Other Ambulatory Visit: Payer: Self-pay

## 2021-05-11 ENCOUNTER — Encounter (HOSPITAL_COMMUNITY): Payer: Self-pay | Admitting: *Deleted

## 2021-05-11 ENCOUNTER — Ambulatory Visit (HOSPITAL_COMMUNITY)
Admission: EM | Admit: 2021-05-11 | Discharge: 2021-05-11 | Disposition: A | Discharge status: Home / Self Care | Payer: 59 | Attending: Emergency Medicine | Admitting: Emergency Medicine

## 2021-05-11 ENCOUNTER — Other Ambulatory Visit: Payer: Self-pay

## 2021-05-11 ENCOUNTER — Ambulatory Visit (INDEPENDENT_AMBULATORY_CARE_PROVIDER_SITE_OTHER): Payer: 59

## 2021-05-11 DIAGNOSIS — S66911A Strain of unspecified muscle, fascia and tendon at wrist and hand level, right hand, initial encounter: Secondary | ICD-10-CM | POA: Diagnosis not present

## 2021-05-11 DIAGNOSIS — M25531 Pain in right wrist: Secondary | ICD-10-CM | POA: Diagnosis not present

## 2021-05-11 DIAGNOSIS — S63502A Unspecified sprain of left wrist, initial encounter: Secondary | ICD-10-CM

## 2021-05-11 DIAGNOSIS — S63501A Unspecified sprain of right wrist, initial encounter: Secondary | ICD-10-CM | POA: Diagnosis not present

## 2021-05-11 DIAGNOSIS — W108XXA Fall (on) (from) other stairs and steps, initial encounter: Secondary | ICD-10-CM | POA: Diagnosis not present

## 2021-05-11 DIAGNOSIS — M25532 Pain in left wrist: Secondary | ICD-10-CM | POA: Diagnosis not present

## 2021-05-11 DIAGNOSIS — S46911A Strain of unspecified muscle, fascia and tendon at shoulder and upper arm level, right arm, initial encounter: Secondary | ICD-10-CM | POA: Diagnosis not present

## 2021-05-11 DIAGNOSIS — M25511 Pain in right shoulder: Secondary | ICD-10-CM

## 2021-05-11 NOTE — Discharge Instructions (Signed)
You can take Tylenol and/or Ibuprofen as needed for pain relief and fever reduction.   You can wear a wrist splint for comfort, but try to do gentle stretching periodically during the day.   Rest as much as possible Ice for 10-15 minutes every 4-6 hours as needed for pain and swelling Compression- use an ace bandage or splint for comfort Elevate above your hip/heart when sitting and laying down  Follow up with sports medicine or orthopedics if symptoms do not improve in the next week.
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
## 2021-05-11 NOTE — ED Triage Notes (Signed)
Pt fell off step stool last night at home. Pt now reports RT shoulder pain and bil wrist pain

## 2021-05-24 ENCOUNTER — Other Ambulatory Visit: Payer: Self-pay

## 2021-05-24 ENCOUNTER — Encounter: Payer: Self-pay | Admitting: Nurse Practitioner

## 2021-05-24 ENCOUNTER — Ambulatory Visit (INDEPENDENT_AMBULATORY_CARE_PROVIDER_SITE_OTHER): Payer: 59 | Admitting: Nurse Practitioner

## 2021-05-24 VITALS — BP 165/86 | HR 68 | Temp 97.2°F | Ht 60.0 in | Wt 190.4 lb

## 2021-05-24 DIAGNOSIS — Z1329 Encounter for screening for other suspected endocrine disorder: Secondary | ICD-10-CM

## 2021-05-24 DIAGNOSIS — Z8669 Personal history of other diseases of the nervous system and sense organs: Secondary | ICD-10-CM

## 2021-05-24 DIAGNOSIS — I10 Essential (primary) hypertension: Secondary | ICD-10-CM

## 2021-05-24 DIAGNOSIS — E782 Mixed hyperlipidemia: Secondary | ICD-10-CM

## 2021-05-24 DIAGNOSIS — E669 Obesity, unspecified: Secondary | ICD-10-CM | POA: Diagnosis not present
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
## 2021-05-24 NOTE — Patient Instructions (Addendum)
[facility] Call [phone]   Managing Your Hypertension Hypertension, also called high blood pressure, is when the force of the blood pressing against the walls of the arteries is too strong. Arteries are blood vessels that carry blood from your heart throughout your body. Hypertension forces the heart to work harder to pump blood and may cause the arteries to become narrow or stiff. Understanding blood pressure readings Your personal target blood pressure may vary depending on your medical conditions, your age, and other factors. A blood pressure reading includes a higher number over a lower number. Ideally, your blood pressure should be below 120/80. You should know that: The first, or top, number is called the systolic pressure. It is a measure of the pressure in your arteries as your heart beats. The second, or bottom number, is called the diastolic pressure. It is a measure of the pressure in your arteries as the heart relaxes. Blood pressure is classified into four stages. Based on your blood pressure reading, your health care provider may use the following stages to determine what type of treatment you need, if any. Systolic pressure and diastolic pressure are measured in a unit called mmHg. Normal Systolic pressure: below 696. Diastolic pressure: below 80. Elevated Systolic pressure: 789-381. Diastolic pressure: below 80. Hypertension stage 1 Systolic pressure: 017-510. Diastolic pressure: 25-85. Hypertension stage 2 Systolic pressure: 277 or above. Diastolic pressure: 90 or above. How can this condition affect me? Managing your hypertension is an important responsibility. Over time, hypertension can damage the arteries and decrease blood flow to important parts of the body, including the brain, heart, and kidneys. Having untreated or uncontrolled hypertension can lead to: A heart attack. A stroke. A weakened blood vessel (aneurysm). Heart failure. Kidney damage. Eye  damage. Metabolic syndrome. Memory and concentration problems. Vascular dementia. What actions can I take to manage this condition? Hypertension can be managed by making lifestyle changes and possibly by taking medicines. Your health care provider will help you make a plan to bring your blood pressure within a normal range. Nutrition  Eat a diet that is high in fiber and potassium, and low in salt (sodium), added sugar, and fat. An example eating plan is called the Dietary Approaches to Stop Hypertension (DASH) diet. To eat this way: Eat plenty of fresh fruits and vegetables. Try to fill one-half of your plate at each meal with fruits and vegetables. Eat whole grains, such as whole-wheat pasta, brown rice, or whole-grain bread. Fill about one-fourth of your plate with whole grains. Eat low-fat dairy products. Avoid fatty cuts of meat, processed or cured meats, and poultry with skin. Fill about one-fourth of your plate with lean proteins such as fish, chicken without skin, beans, eggs, and tofu. Avoid pre-made and processed foods. These tend to be higher in sodium, added sugar, and fat. Reduce your daily sodium intake. Most people with hypertension should eat less than 1,500 mg of sodium a day. Lifestyle  Work with your health care provider to maintain a healthy body weight or to lose weight. Ask what an ideal weight is for you. Get at least 30 minutes of exercise that causes your heart to beat faster (aerobic exercise) most days of the week. Activities may include walking, swimming, or biking. Include exercise to strengthen your muscles (resistance exercise), such as weight lifting, as part of your weekly exercise routine. Try to do these types of exercises for 30 minutes at least 3 days a week. Do not use any products that contain nicotine  or tobacco, such as cigarettes, e-cigarettes, and chewing tobacco. If you need help quitting, ask your health care provider. Control any long-term (chronic)  conditions you have, such as high cholesterol or diabetes. Identify your sources of stress and find ways to manage stress. This may include meditation, deep breathing, or making time for fun activities. Alcohol use Do not drink alcohol if: Your health care provider tells you not to drink. You are pregnant, may be pregnant, or are planning to become pregnant. If you drink alcohol: Limit how much you use to: 0-1 drink a day for women. 0-2 drinks a day for men. Be aware of how much alcohol is in your drink. In the [place] one drink equals one 12 oz bottle of beer (355 mL), one 5 oz glass of wine (148 mL), or one 1 oz glass of hard liquor (44 mL). Medicines Your health care provider may prescribe medicine if lifestyle changes are not enough to get your blood pressure under control and if: Your systolic blood pressure is 130 or higher. Your diastolic blood pressure is 80 or higher. Take medicines only as told by your health care provider. Follow the directions carefully. Blood pressure medicines must be taken as told by your health care provider. The medicine does not work as well when you skip doses. Skipping doses also puts you at risk for problems. Monitoring Before you monitor your blood pressure: Do not smoke, drink caffeinated beverages, or exercise within 30 minutes before taking a measurement. Use the bathroom and empty your bladder (urinate). Sit quietly for at least 5 minutes before taking measurements. Monitor your blood pressure at home as told by your health care provider. To do this: Sit with your back straight and supported. Place your feet flat on the floor. Do not cross your legs. Support your arm on a flat surface, such as a table. Make sure your upper arm is at heart level. Each time you measure, take two or three readings one minute apart and record the results. You may also need to have your blood pressure checked regularly by your health care provider. General  information Talk with your health care provider about your diet, exercise habits, and other lifestyle factors that may be contributing to hypertension. Review all the medicines you take with your health care provider because there may be side effects or interactions. Keep all visits as told by your health care provider. Your health care provider can help you create and adjust your plan for managing your high blood pressure. Where to find more information National Heart, Lung, and Blood Institute: https://wilson-eaton.com/ American Heart Association: www.heart.org Contact a health care provider if: You think you are having a reaction to medicines you have taken. You have repeated (recurrent) headaches. You feel dizzy. You have swelling in your ankles. You have trouble with your vision. Get help right away if: You develop a severe headache or confusion. You have unusual weakness or numbness, or you feel faint. You have severe pain in your chest or abdomen. You vomit repeatedly. You have trouble breathing. These symptoms may represent a serious problem that is an emergency. Do not wait to see if the symptoms will go away. Get medical help right away. Call your local emergency services (911 in the [place] Do not drive yourself to the hospital. Summary Hypertension is when the force of blood pumping through your arteries is too strong. If this condition is not controlled, it may put you at risk for serious complications. Your personal target  blood pressure may vary depending on your medical conditions, your age, and other factors. For most people, a normal blood pressure is less than 120/80. Hypertension is managed by lifestyle changes, medicines, or both. Lifestyle changes to help manage hypertension include losing weight, eating a healthy, low-sodium diet, exercising more, stopping smoking, and limiting alcohol. This information is not intended to replace advice given to you by your health care  provider. Make sure you discuss any questions you have with your health care provider. Document Revised: 09/11/2019 Document Reviewed: 07/07/2019 Elsevier Patient Education  2022 [employer].

## 2021-05-25 ENCOUNTER — Other Ambulatory Visit: Payer: Self-pay

## 2021-05-25 LAB — COMP. METABOLIC PANEL (12)
AST: 17 IU/L (ref 0–40)
Albumin/Globulin Ratio: 1.5 (ref 1.2–2.2)
Albumin: 4.2 g/dL (ref 3.8–4.8)
Alkaline Phosphatase: 94 IU/L (ref 44–121)
BUN/Creatinine Ratio: 22 (ref 12–28)
BUN: 15 mg/dL (ref 8–27)
Bilirubin Total: 0.3 mg/dL (ref 0.0–1.2)
Calcium: 9.2 mg/dL (ref 8.7–10.3)
Chloride: 105 mmol/L (ref 96–106)
Creatinine, Ser: 0.67 mg/dL (ref 0.57–1.00)
Globulin, Total: 2.8 g/dL (ref 1.5–4.5)
Glucose: 95 mg/dL (ref 70–99)
Potassium: 4.7 mmol/L (ref 3.5–5.2)
Sodium: 140 mmol/L (ref 134–144)
Total Protein: 7 g/dL (ref 6.0–8.5)
eGFR: 98 mL/min/{1.73_m2} (ref >59)

## 2021-05-25 LAB — LIPID PANEL
Chol/HDL Ratio: 3.4 ratio (ref 0.0–4.4)
Cholesterol, Total: 226 mg/dL — ABNORMAL HIGH (ref 100–199)
HDL: 66 mg/dL (ref >39)
LDL Chol Calc (NIH): 136 mg/dL — ABNORMAL HIGH (ref 0–99)
Triglycerides: 139 mg/dL (ref 0–149)
VLDL Cholesterol Cal: 24 mg/dL (ref 5–40)

## 2021-05-25 LAB — TSH: TSH: 1.73 u[IU]/mL (ref 0.450–4.500)

## 2021-05-29 ENCOUNTER — Other Ambulatory Visit: Payer: Self-pay

## 2021-05-29 ENCOUNTER — Other Ambulatory Visit: Payer: Self-pay | Admitting: Nurse Practitioner

## 2021-05-29 DIAGNOSIS — M5442 Lumbago with sciatica, left side: Secondary | ICD-10-CM

## 2021-05-29 DIAGNOSIS — G8929 Other chronic pain: Secondary | ICD-10-CM

## 2021-05-29 MED ORDER — METHOCARBAMOL 500 MG PO TABS
500.0000 mg | ORAL_TABLET | Freq: Three times a day (TID) | ORAL | 0 refills | Status: DC | PRN
Start: 2021-05-29 — End: 2021-12-04
  Filled 2021-05-29: qty 90, 30d supply, fill #0

## 2021-05-29 MED ORDER — INDOMETHACIN 50 MG PO CAPS
ORAL_CAPSULE | ORAL | 0 refills | Status: DC
  Filled 2021-05-29: qty 90, 30d supply, fill #0

## 2021-08-18 ENCOUNTER — Other Ambulatory Visit: Payer: Self-pay

## 2021-08-18 ENCOUNTER — Ambulatory Visit
Admission: EM | Admit: 2021-08-18 | Discharge: 2021-08-18 | Disposition: A | Discharge status: Home / Self Care | Payer: 59 | Attending: Student | Admitting: Student

## 2021-08-18 DIAGNOSIS — U071 COVID-19: Secondary | ICD-10-CM | POA: Diagnosis not present

## 2021-08-18 DIAGNOSIS — K6289 Other specified diseases of anus and rectum: Secondary | ICD-10-CM | POA: Diagnosis not present

## 2021-08-18 MED ORDER — HYDROCORTISONE (PERIANAL) 2.5 % EX CREA
1.0000 | TOPICAL_CREAM | Freq: Two times a day (BID) | CUTANEOUS | 0 refills | Status: DC
Start: 2021-08-18 — End: 2022-09-10

## 2021-08-18 MED ORDER — NITROGLYCERIN 0.4 % RE OINT
1.0000 [in_us] | TOPICAL_OINTMENT | Freq: Two times a day (BID) | RECTAL | 0 refills | Status: DC | PRN
Start: 2021-08-18 — End: 2022-09-10
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
## 2021-08-18 NOTE — Discharge Instructions (Addendum)
-  Anusol twice daily while symptoms persist -Nitroglycerin ointment twice daily for pain, as needed -Follow-up if symptoms worsen/persist -Make sure to drink plenty of fluids  -Take the Imodium (loperamide) up to 4 times daily for diarrhea.

## 2021-08-18 NOTE — ED Triage Notes (Signed)
Pt c/o diarrhea ~ 1 week post covid. States she has wiped so much it's bleeding now. States "I am swollen," anus burns and itches.

## 2021-08-24 ENCOUNTER — Ambulatory Visit: Payer: 59 | Admitting: Nurse Practitioner

## 2021-09-04 ENCOUNTER — Ambulatory Visit: Payer: Self-pay | Admitting: Nurse Practitioner

## 2021-09-04 ENCOUNTER — Encounter: Payer: Self-pay | Admitting: Nurse Practitioner

## 2021-09-04 ENCOUNTER — Other Ambulatory Visit: Payer: Self-pay

## 2021-09-04 VITALS — BP 152/88 | HR 89 | Temp 97.5°F | Ht 60.0 in | Wt 185.6 lb

## 2021-09-04 DIAGNOSIS — R1030 Lower abdominal pain, unspecified: Secondary | ICD-10-CM

## 2021-09-04 DIAGNOSIS — Z0000 Encounter for general adult medical examination without abnormal findings: Secondary | ICD-10-CM

## 2021-09-04 DIAGNOSIS — R197 Diarrhea, unspecified: Secondary | ICD-10-CM

## 2021-09-04 LAB — POCT GLYCOSYLATED HEMOGLOBIN (HGB A1C)
HbA1c POC (<> result, manual entry): 5.7 % (ref 4.0–5.6)
HbA1c, POC (controlled diabetic range): 5.7 % (ref 0.0–7.0)
HbA1c, POC (prediabetic range): 5.7 % (ref 5.7–6.4)
Hemoglobin A1C: 5.7 % — AB (ref 4.0–5.6)

## 2021-09-04 NOTE — Patient Instructions (Signed)
You were seen today in the [facility] for diarrhea and abdominal pain. Labs were collected, results will be available via MyChart or, if abnormal, you will be contacted by clinic staff. You were prescribed medications, please take as directed. Please follow up in 3 mths  for reevaluation and wellness exam. Referral completed for mammogram and colonoscopy.
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
## 2021-09-05 LAB — COMPREHENSIVE METABOLIC PANEL
ALT: 5 IU/L (ref 0–32)
AST: 14 IU/L (ref 0–40)
Albumin/Globulin Ratio: 1.5 (ref 1.2–2.2)
Albumin: 4.5 g/dL (ref 3.8–4.8)
Alkaline Phosphatase: 97 IU/L (ref 44–121)
BUN/Creatinine Ratio: 16 (ref 12–28)
BUN: 13 mg/dL (ref 8–27)
Bilirubin Total: 0.2 mg/dL (ref 0.0–1.2)
CO2: 23 mmol/L (ref 20–29)
Calcium: 9.8 mg/dL (ref 8.7–10.3)
Chloride: 103 mmol/L (ref 96–106)
Creatinine, Ser: 0.81 mg/dL (ref 0.57–1.00)
Globulin, Total: 3.1 g/dL (ref 1.5–4.5)
Glucose: 115 mg/dL — ABNORMAL HIGH (ref 70–99)
Potassium: 4.3 mmol/L (ref 3.5–5.2)
Sodium: 142 mmol/L (ref 134–144)
Total Protein: 7.6 g/dL (ref 6.0–8.5)
eGFR: 81 mL/min/{1.73_m2} (ref >59)

## 2021-09-05 LAB — LIPASE: Lipase: 35 U/L (ref 14–72)

## 2021-09-05 LAB — CBC WITH DIFFERENTIAL/PLATELET
Basophils Absolute: 0 10*3/uL (ref 0.0–0.2)
Basos: 1 %
EOS (ABSOLUTE): 0 10*3/uL (ref 0.0–0.4)
Eos: 1 %
Hematocrit: 40.3 % (ref 34.0–46.6)
Hemoglobin: 13.4 g/dL (ref 11.1–15.9)
Immature Grans (Abs): 0 10*3/uL (ref 0.0–0.1)
Immature Granulocytes: 0 %
Lymphocytes Absolute: 1.4 10*3/uL (ref 0.7–3.1)
Lymphs: 32 %
MCH: 29.8 pg (ref 26.6–33.0)
MCHC: 33.3 g/dL (ref 31.5–35.7)
MCV: 90 fL (ref 79–97)
Monocytes Absolute: 0.4 10*3/uL (ref 0.1–0.9)
Monocytes: 10 %
Neutrophils Absolute: 2.4 10*3/uL (ref 1.4–7.0)
Neutrophils: 56 %
Platelets: 279 10*3/uL (ref 150–450)
RBC: 4.5 x10E6/uL (ref 3.77–5.28)
RDW: 12.4 % (ref 11.7–15.4)
WBC: 4.4 10*3/uL (ref 3.4–10.8)

## 2021-09-12 ENCOUNTER — Ambulatory Visit (HOSPITAL_BASED_OUTPATIENT_CLINIC_OR_DEPARTMENT_OTHER): Payer: 59

## 2021-09-12 ENCOUNTER — Ambulatory Visit (HOSPITAL_BASED_OUTPATIENT_CLINIC_OR_DEPARTMENT_OTHER): Payer: 59 | Admitting: Radiology

## 2021-10-24 ENCOUNTER — Ambulatory Visit (HOSPITAL_BASED_OUTPATIENT_CLINIC_OR_DEPARTMENT_OTHER): Payer: 59 | Admitting: Radiology

## 2021-10-24 ENCOUNTER — Other Ambulatory Visit: Payer: Self-pay

## 2021-10-24 ENCOUNTER — Encounter (HOSPITAL_BASED_OUTPATIENT_CLINIC_OR_DEPARTMENT_OTHER): Payer: Self-pay | Admitting: Radiology

## 2021-10-24 ENCOUNTER — Ambulatory Visit (HOSPITAL_BASED_OUTPATIENT_CLINIC_OR_DEPARTMENT_OTHER)
Admission: RE | Admit: 2021-10-24 | Discharge: 2021-10-24 | Disposition: A | Discharge status: Home / Self Care | Payer: 59 | Source: Ambulatory Visit | Attending: Nurse Practitioner | Admitting: Nurse Practitioner

## 2021-10-24 DIAGNOSIS — Z1231 Encounter for screening mammogram for malignant neoplasm of breast: Secondary | ICD-10-CM | POA: Diagnosis present

## 2021-10-24 DIAGNOSIS — Z0000 Encounter for general adult medical examination without abnormal findings: Secondary | ICD-10-CM

## 2021-12-04 ENCOUNTER — Encounter: Payer: Self-pay | Admitting: Nurse Practitioner

## 2021-12-04 ENCOUNTER — Other Ambulatory Visit: Payer: Self-pay

## 2021-12-04 ENCOUNTER — Ambulatory Visit (INDEPENDENT_AMBULATORY_CARE_PROVIDER_SITE_OTHER): Payer: 59 | Admitting: Nurse Practitioner

## 2021-12-04 VITALS — BP 164/97 | HR 71 | Temp 97.2°F | Ht 60.0 in | Wt 190.6 lb

## 2021-12-04 DIAGNOSIS — I10 Essential (primary) hypertension: Secondary | ICD-10-CM | POA: Diagnosis not present

## 2021-12-04 DIAGNOSIS — M5442 Lumbago with sciatica, left side: Secondary | ICD-10-CM | POA: Diagnosis not present

## 2021-12-04 DIAGNOSIS — M10472 Other secondary gout, left ankle and foot: Secondary | ICD-10-CM

## 2021-12-04 DIAGNOSIS — R197 Diarrhea, unspecified: Secondary | ICD-10-CM

## 2021-12-04 DIAGNOSIS — G8929 Other chronic pain: Secondary | ICD-10-CM

## 2021-12-04 MED ORDER — METHOCARBAMOL 500 MG PO TABS
500.0000 mg | ORAL_TABLET | Freq: Three times a day (TID) | ORAL | 0 refills | Status: DC | PRN
  Filled 2021-12-04: qty 90, 30d supply, fill #0

## 2021-12-04 MED ORDER — AMLODIPINE BESYLATE 10 MG PO TABS
10.0000 mg | ORAL_TABLET | Freq: Every day | ORAL | 3 refills | Status: DC
  Filled 2021-12-04: qty 90, 90d supply, fill #0

## 2021-12-04 MED ORDER — HYDROCHLOROTHIAZIDE 12.5 MG PO CAPS
25.0000 mg | ORAL_CAPSULE | Freq: Every day | ORAL | 0 refills | Status: DC
  Filled 2021-12-04: qty 180, 90d supply, fill #0

## 2021-12-04 MED ORDER — INDOMETHACIN 50 MG PO CAPS
ORAL_CAPSULE | ORAL | 0 refills | Status: DC
  Filled 2021-12-04: qty 90, 30d supply, fill #0
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
## 2021-12-04 NOTE — Patient Instructions (Signed)
You were seen today in the [facility] for wellness visit.  You were prescribed medications, please take as directed. Please follow up in 6 mths for reevaluation of symptoms ?

## 2021-12-05 ENCOUNTER — Telehealth: Payer: Self-pay

## 2021-12-05 ENCOUNTER — Other Ambulatory Visit: Payer: Self-pay

## 2021-12-05 NOTE — Telephone Encounter (Signed)
Pt called and ask for her med's to go to [facility] on [address] ?

## 2021-12-06 ENCOUNTER — Other Ambulatory Visit: Payer: Self-pay | Admitting: Nurse Practitioner

## 2021-12-06 ENCOUNTER — Other Ambulatory Visit: Payer: Self-pay

## 2021-12-06 DIAGNOSIS — G8929 Other chronic pain: Secondary | ICD-10-CM

## 2021-12-06 DIAGNOSIS — M10472 Other secondary gout, left ankle and foot: Secondary | ICD-10-CM

## 2021-12-06 DIAGNOSIS — I10 Essential (primary) hypertension: Secondary | ICD-10-CM

## 2021-12-06 MED ORDER — METHOCARBAMOL 500 MG PO TABS: 500.0000 mg | ORAL_TABLET | Freq: Three times a day (TID) | ORAL | 0 refills | Status: DC | PRN

## 2021-12-06 MED ORDER — AMLODIPINE BESYLATE 10 MG PO TABS: 10.0000 mg | ORAL_TABLET | Freq: Every day | ORAL | 3 refills | Status: DC

## 2021-12-06 MED ORDER — HYDROCHLOROTHIAZIDE 12.5 MG PO CAPS: 25.0000 mg | ORAL_CAPSULE | Freq: Every day | ORAL | 0 refills | Status: DC

## 2021-12-06 MED ORDER — INDOMETHACIN 50 MG PO CAPS: 50.0000 mg | ORAL_CAPSULE | Freq: Three times a day (TID) | ORAL | 0 refills | Status: AC | PRN

## 2021-12-08 ENCOUNTER — Telehealth: Payer: Self-pay

## 2021-12-08 NOTE — Telephone Encounter (Signed)
Pt NOW called and changed ... ? ? ?Need all med's to bre resend to [facility] ?

## 2021-12-14 ENCOUNTER — Telehealth: Payer: Self-pay

## 2021-12-14 NOTE — Telephone Encounter (Signed)
Kaylee [name] ? ?This pt wants all her med's to be RE SUBMITTED to [facility] ?

## 2021-12-19 ENCOUNTER — Other Ambulatory Visit: Payer: Self-pay | Admitting: Nurse Practitioner

## 2021-12-19 ENCOUNTER — Other Ambulatory Visit: Payer: Self-pay

## 2021-12-19 DIAGNOSIS — I10 Essential (primary) hypertension: Secondary | ICD-10-CM

## 2021-12-19 DIAGNOSIS — G8929 Other chronic pain: Secondary | ICD-10-CM

## 2021-12-19 MED ORDER — HYDROCHLOROTHIAZIDE 12.5 MG PO CAPS
25.0000 mg | ORAL_CAPSULE | Freq: Every day | ORAL | 0 refills | Status: DC
  Filled 2021-12-19: qty 180, 90d supply, fill #0

## 2021-12-19 MED ORDER — AMLODIPINE BESYLATE 10 MG PO TABS
10.0000 mg | ORAL_TABLET | Freq: Every day | ORAL | 3 refills | Status: DC
  Filled 2021-12-19: qty 90, 90d supply, fill #0

## 2021-12-19 MED ORDER — METHOCARBAMOL 500 MG PO TABS
500.0000 mg | ORAL_TABLET | Freq: Three times a day (TID) | ORAL | 0 refills | Status: DC | PRN
  Filled 2021-12-19: qty 90, 30d supply, fill #0

## 2021-12-27 ENCOUNTER — Ambulatory Visit (AMBULATORY_SURGERY_CENTER): Payer: 59 | Admitting: *Deleted

## 2021-12-27 ENCOUNTER — Other Ambulatory Visit: Payer: Self-pay

## 2021-12-27 VITALS — Ht 60.0 in | Wt 190.0 lb

## 2021-12-27 DIAGNOSIS — Z1211 Encounter for screening for malignant neoplasm of colon: Secondary | ICD-10-CM

## 2021-12-27 MED ORDER — PEG 3350-KCL-NA BICARB-NACL 420 G PO SOLR
4000.0000 mL | Freq: Once | ORAL | 0 refills | Status: AC
  Filled 2021-12-27: qty 4000, 1d supply, fill #0

## 2021-12-27 NOTE — Progress Notes (Signed)

## 2021-12-28 ENCOUNTER — Other Ambulatory Visit: Payer: Self-pay

## 2022-01-05 ENCOUNTER — Encounter: Payer: Self-pay | Admitting: Gastroenterology

## 2022-01-08 ENCOUNTER — Encounter: Payer: Self-pay | Admitting: Gastroenterology

## 2022-01-08 ENCOUNTER — Ambulatory Visit (AMBULATORY_SURGERY_CENTER): Payer: 59 | Admitting: Gastroenterology

## 2022-01-08 VITALS — BP 148/96 | HR 78 | Temp 98.9°F | Resp 17 | Ht 60.0 in | Wt 190.0 lb

## 2022-01-08 DIAGNOSIS — D123 Benign neoplasm of transverse colon: Secondary | ICD-10-CM

## 2022-01-08 DIAGNOSIS — Z1211 Encounter for screening for malignant neoplasm of colon: Secondary | ICD-10-CM | POA: Diagnosis present

## 2022-01-08 DIAGNOSIS — D125 Benign neoplasm of sigmoid colon: Secondary | ICD-10-CM

## 2022-01-08 DIAGNOSIS — D122 Benign neoplasm of ascending colon: Secondary | ICD-10-CM

## 2022-01-08 MED ORDER — SODIUM CHLORIDE 0.9 % IV SOLN: 500.0000 mL | Freq: Once | INTRAVENOUS | Status: DC
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
## 2022-01-08 NOTE — Op Note (Signed)
[place] Patient Name: Kaylee [name] Procedure Date: 01/08/2022 8:21 AM MRN: [id number] Endoscopist: Nicki Reaper E. Candis Schatz , MD Age: 66 Referring MD:  Date of Birth: 04-16-1956 Gender: Female Account #: [id number] Procedure:                Colonoscopy Indications:              Screening for colorectal malignant neoplasm, This                            is the patient's first colonoscopy Medicines:                Monitored Anesthesia Care Procedure:                Pre-Anesthesia Assessment:                           - Prior to the procedure, a History and Physical                            was performed, and patient medications and                            allergies were reviewed. The patient's tolerance of                            previous anesthesia was also reviewed. The risks                            and benefits of the procedure and the sedation                            options and risks were discussed with the patient.                            All questions were answered, and informed consent                            was obtained. Prior Anticoagulants: The patient has                            taken no previous anticoagulant or antiplatelet                            agents. ASA Grade Assessment: II - A patient with                            mild systemic disease. After reviewing the risks                            and benefits, the patient was deemed in                            satisfactory condition to undergo the procedure.  After obtaining informed consent, the colonoscope                            was passed under direct vision. Throughout the                            procedure, the patient's blood pressure, pulse, and                            oxygen saturations were monitored continuously. The                            CF [id number] #[id number] was introduced through the anus                            and advanced to the  the cecum, identified by                            appendiceal orifice and ileocecal valve. The                            colonoscopy was performed without difficulty. The                            patient tolerated the procedure well. The quality                            of the bowel preparation was good. The ileocecal                            valve, appendiceal orifice, and rectum were                            photographed. The bowel preparation used was                            GoLYTELY via split dose instruction. Scope In: 8:32:29 AM Scope Out: 8:56:34 AM Scope Withdrawal Time: 0 hours 16 minutes 23 seconds  Total Procedure Duration: 0 hours 24 minutes 5 seconds  Findings:                 The perianal and digital rectal examinations were                            normal. Pertinent negatives include normal                            sphincter tone and no palpable rectal lesions.                           A 3 mm polyp was found in the ascending colon. The                            polyp was sessile. The polyp was removed  with a                            cold snare. Resection and retrieval were complete.                            Estimated blood loss was minimal.                           A 2 mm polyp was found in the hepatic flexure. The                            polyp was sessile. The polyp was removed with a                            cold snare. Resection and retrieval were complete.                            Estimated blood loss was minimal.                           A 2 mm polyp was found in the sigmoid colon. The                            polyp was flat. The polyp was removed with a cold                            snare. Resection was complete, but the polyp tissue                            was not retrieved. Estimated blood loss was minimal.                           A 8 mm polyp was found in the sigmoid colon. The                            polyp was  pedunculated. The polyp was removed with                            a cold snare. Resection and retrieval were                            complete. Estimated blood loss was minimal.                           The exam was otherwise normal throughout the                            examined colon.                           The retroflexed view of the distal rectum and anal  verge was normal and showed no anal or rectal                            abnormalities. Complications:            No immediate complications. Estimated Blood Loss:     Estimated blood loss was minimal. Impression:               - One 3 mm polyp in the ascending colon, removed                            with a cold snare. Resected and retrieved.                           - One 3 mm polyp at the hepatic flexure, removed                            with a cold snare. Resected and retrieved.                           - One 2 mm polyp in the sigmoid colon, removed with                            a cold snare. Complete resection. Polyp tissue not                            retrieved.                           - One 8 mm polyp in the sigmoid colon, removed with                            a cold snare. Resected and retrieved.                           - The distal rectum and anal verge are normal on                            retroflexion view. Recommendation:           - Patient has a contact number available for                            emergencies. The signs and symptoms of potential                            delayed complications were discussed with the                            patient. Return to normal activities tomorrow.                            Written discharge instructions were provided to the  patient.                           - Resume previous diet.                           - Continue present medications.                           - Await pathology  results.                           - Repeat colonoscopy (date not yet determined) for                            surveillance based on pathology results. Yue Flanigan E. Candis Schatz, MD 01/08/2022 9:05:42 AM This report has been signed electronically.

## 2022-01-08 NOTE — Patient Instructions (Signed)
HANDOUT given for polyps.  YOU HAD AN ENDOSCOPIC PROCEDURE TODAY AT [facility]:   Refer to the procedure report that was given to you for any specific questions about what was found during the examination.  If the procedure report does not answer your questions, please call your gastroenterologist to clarify.  If you requested that your care partner not be given the details of your procedure findings, then the procedure report has been included in a sealed envelope for you to review at your convenience later.  YOU SHOULD EXPECT: Some feelings of bloating in the abdomen. Passage of more gas than usual.  Walking can help get rid of the air that was put into your GI tract during the procedure and reduce the bloating. If you had a lower endoscopy (such as a colonoscopy or flexible sigmoidoscopy) you may notice spotting of blood in your stool or on the toilet paper. If you underwent a bowel prep for your procedure, you may not have a normal bowel movement for a few days.  Please Note:  You might notice some irritation and congestion in your nose or some drainage.  This is from the oxygen used during your procedure.  There is no need for concern and it should clear up in a day or so.  SYMPTOMS TO REPORT IMMEDIATELY:  Following lower endoscopy (colonoscopy):  Excessive amounts of blood in the stool  Significant tenderness or worsening of abdominal pains  Swelling of the abdomen that is new, acute  Fever of 100F or higher  For urgent or emergent issues, a gastroenterologist can be reached at any hour by calling [phone]. Do not use MyChart messaging for urgent concerns.    DIET:  We do recommend a small meal at first, but then you may proceed to your regular diet.  Drink plenty of fluids but you should avoid alcoholic beverages for 24 hours.  ACTIVITY:  You should plan to take it easy for the rest of today and you should NOT DRIVE or use heavy machinery until tomorrow (because  of the sedation medicines used during the test).    FOLLOW UP: Our staff will call the number listed on your records TOMORROW to check on you and address any questions or concerns that you may have regarding the information given to you following your procedure. If we do not reach you, we will leave a message.  We will attempt to reach you two times.  During this call, we will ask if you have developed any symptoms of COVID 19. If you develop any symptoms (ie: fever, flu-like symptoms, shortness of breath, cough etc.) before then, please call [phone].  If you test positive for Covid 19 in the 2 weeks post procedure, please call and report this information to [place].    If any biopsies were taken you will be contacted by phone or by letter within the next 1-3 weeks.  Please call us at [phone] if you have not heard about the biopsies in 3 weeks.    SIGNATURES/CONFIDENTIALITY: You and/or your care partner have signed paperwork which will be entered into your electronic medical record.  These signatures attest to the fact that that the information above on your After Visit Summary has been reviewed and is understood.  Full responsibility of the confidentiality of this discharge information lies with you and/or your care-partner.

## 2022-01-08 NOTE — Progress Notes (Signed)
Called to room to assist during endoscopic procedure.  Patient ID and intended procedure confirmed with present staff. Received instructions for my participation in the procedure from the performing physician.  

## 2022-01-08 NOTE — Progress Notes (Signed)
Pt's states no medical or surgical changes since previsit or office visit. 

## 2022-01-08 NOTE — Progress Notes (Signed)
A and O x3. Report to RN. Tolerated MAC anesthesia well. 

## 2022-01-09 ENCOUNTER — Telehealth: Payer: Self-pay

## 2022-01-09 NOTE — Telephone Encounter (Signed)
  Follow up Call-     01/08/2022    7:36 AM  Call back number  Post procedure Call Back phone  # [phone]  Permission to leave phone message Yes     Patient questions:  Do you have a fever, pain , or abdominal swelling? No. Pain Score  0 *  Have you tolerated food without any problems? Yes.    Have you been able to return to your normal activities? Yes.    Do you have any questions about your discharge instructions: Diet   No. Medications  No. Follow up visit  No.  Do you have questions or concerns about your Care? No.  Actions: * If pain score is 4 or above: No action needed, pain <4.

## 2022-01-12 NOTE — Progress Notes (Signed)
Kaylee [name],  The polyps that I removed during your recent procedure were completely benign but were proven to be "pre-cancerous" polyps that MAY have grown into cancers if they had not been removed.  Studies shows that at least 20% of women over age 65 and 30% of men over age 21 have pre-cancerous polyps.  Based on current nationally recognized surveillance guidelines, I recommend that you have a repeat colonoscopy in 3 years.   If you develop any new rectal bleeding, abdominal pain or significant bowel habit changes, please contact me before then.

## 2022-02-06 ENCOUNTER — Ambulatory Visit: Payer: Self-pay | Admitting: Allergy & Immunology

## 2022-03-05 ENCOUNTER — Ambulatory Visit: Payer: 59 | Admitting: Nurse Practitioner

## 2022-03-09 ENCOUNTER — Other Ambulatory Visit: Payer: Self-pay

## 2022-03-09 ENCOUNTER — Ambulatory Visit (INDEPENDENT_AMBULATORY_CARE_PROVIDER_SITE_OTHER): Payer: 59 | Admitting: Nurse Practitioner

## 2022-03-09 ENCOUNTER — Encounter: Payer: Self-pay | Admitting: Nurse Practitioner

## 2022-03-09 VITALS — BP 144/95 | HR 82 | Temp 98.0°F | Ht 60.0 in | Wt 186.4 lb

## 2022-03-09 DIAGNOSIS — M10472 Other secondary gout, left ankle and foot: Secondary | ICD-10-CM | POA: Diagnosis not present

## 2022-03-09 DIAGNOSIS — M109 Gout, unspecified: Secondary | ICD-10-CM | POA: Insufficient documentation

## 2022-03-09 MED ORDER — INDOMETHACIN 50 MG PO CAPS
50.0000 mg | ORAL_CAPSULE | Freq: Three times a day (TID) | ORAL | 2 refills | Status: DC
  Filled 2022-03-09: qty 90, 30d supply, fill #0

## 2022-03-09 NOTE — Progress Notes (Signed)
$'@Patient'Z$  ID: Kaylee [name], female    DOB: July 25, 1956, 66 y.o.   MRN: [id number]  Chief Complaint  Patient presents with   Follow-up    Pt is here for 3 month's BP follow up visit. Pt states she has gout in her left middle finger and left toe came back 2 weeks ago also pt states she been having headache, dizziness     Referring provider: Bo Merino I, NP   HPI  Patient presents today for a follow-up visit.  Patient does have a history of hypertension.  Her blood pressure was slightly elevated in office today but patient states that she did not take her blood pressure medications today.  Patient is noncompliant with meds at times.  She states that she will take it today.  Patient has been having issues with gout to left middle finger and left great toe for 2 weeks.  Patient was previously on Indocin for this we will place a refill today.  Patient does state that she has been having a headache with associated high blood pressure.        Allergies  Allergen Reactions   Codeine Nausea And Vomiting   Gabapentin (Once-Daily) Hives   Tomato Itching    Immunization History  Administered Date(s) Administered   PFIZER(Purple Top)SARS-COV-2 Vaccination 08/18/2020, 09/08/2020   Tdap 12/29/2012    Past Medical History:  Diagnosis Date   Allergy    SEASONAL   Arthritis    HANDS,ARMS   Bell's palsy age 10   residual right sided weakness   Bell's palsy 08/28/2019   2nd episode, lost speech this time   Chronic pain in right ear    some locking of jaw, and h/o cerumen impaction   Family history of breast cancer    Daughter is being treated for breast cancer   Hyperlipidemia    Hypertension age 75   Seasonal allergies     Tobacco History: Social History   Tobacco Use  Smoking Status Former   Types: Cigarettes   Quit date: 07/20/1982   Years since quitting: 39.6   Passive exposure: Never  Smokeless Tobacco Never   Counseling given: Not Answered   Outpatient  Encounter Medications as of 03/09/2022  Medication Sig   acetaminophen (TYLENOL) 325 MG tablet Take 650 mg by mouth every 6 (six) hours as needed for mild pain or headache.   amLODipine (NORVASC) 10 MG tablet Take 1 tablet (10 mg total) by mouth daily.   hydrochlorothiazide (MICROZIDE) 12.5 MG capsule Take 2 capsules (25 mg total) by mouth daily.   indomethacin (INDOCIN) 50 MG capsule Take 1 capsule (50 mg total) by mouth 3 (three) times daily with meals.   cholecalciferol (VITAMIN D3) 25 MCG (1000 UNIT) tablet Take 1,000 Units by mouth daily. (Patient not taking: Reported on 05/24/2021)   hydrocortisone (ANUSOL-HC) 2.5 % rectal cream Place 1 application rectally 2 (two) times daily. (Patient not taking: Reported on 01/08/2022)   hydroxypropyl methylcellulose / hypromellose (ISOPTO TEARS / GONIOVISC) 2.5 % ophthalmic solution Place 1 drop into the right eye 4 (four) times daily as needed for dry eyes. (Patient not taking: Reported on 05/24/2021)   methocarbamol (ROBAXIN) 500 MG tablet Take 1 tablet (500 mg total) by mouth every 8 (eight) hours as needed for muscle spasms. (Patient not taking: Reported on 01/08/2022)   Nitroglycerin 0.4 % OINT Place 1 inch rectally 2 (two) times daily as needed. Use up to 3 weeks (Patient not taking: Reported on 09/04/2021)   PRESCRIPTION  MEDICATION as needed. NAME UNKNOWN TAKE MEDICATION FOR GOUT   [DISCONTINUED] cetirizine (ZYRTEC) 10 MG tablet Take 1 tablet (10 mg total) by mouth daily. (Patient not taking: Reported on 04/09/2019)   No facility-administered encounter medications on file as of 03/09/2022.     Review of Systems  Review of Systems  Constitutional: Negative.   HENT: Negative.    Cardiovascular: Negative.   Gastrointestinal: Negative.   Musculoskeletal:        Swelling and pain to left middle finger and left great toe.  Allergic/Immunologic: Negative.   Neurological:  Positive for headaches.  Psychiatric/Behavioral: Negative.         Physical  Exam  BP (!) 144/95 (BP Location: Right Arm, Patient Position: Sitting, Cuff Size: Large)   Pulse 82   Temp 98 F (36.7 C)   Ht 5' (1.524 m)   Wt 186 lb 6.4 oz (84.6 kg)   BMI 36.40 kg/m   Wt Readings from Last 5 Encounters:  03/09/22 186 lb 6.4 oz (84.6 kg)  01/08/22 190 lb (86.2 kg)  12/27/21 190 lb (86.2 kg)  12/04/21 190 lb 9.6 oz (86.5 kg)  09/04/21 185 lb 9.6 oz (84.2 kg)     Physical Exam Vitals and nursing note reviewed.  Constitutional:      General: She is not in acute distress.    Appearance: She is well-developed.  Cardiovascular:     Rate and Rhythm: Normal rate and regular rhythm.  Pulmonary:     Effort: Pulmonary effort is normal.     Breath sounds: Normal breath sounds.  Musculoskeletal:     Left hand: Swelling (left middle finger) and tenderness present.       Feet:  Feet:     Left foot:     Skin integrity: Erythema and warmth present.  Neurological:     Mental Status: She is alert and oriented to person, place, and time.      Lab Results:  CBC    Component Value Date/Time   WBC 4.4 09/04/2021 1514   WBC 2.4 (L) 04/09/2019 1134   RBC 4.50 09/04/2021 1514   RBC 4.43 04/09/2019 1134   HGB 13.4 09/04/2021 1514   HCT 40.3 09/04/2021 1514   PLT 279 09/04/2021 1514   MCV 90 09/04/2021 1514   MCH 29.8 09/04/2021 1514   MCH 29.6 04/09/2019 1134   MCHC 33.3 09/04/2021 1514   MCHC 31.0 04/09/2019 1134   RDW 12.4 09/04/2021 1514   LYMPHSABS 1.4 09/04/2021 1514   MONOABS 0.1 11/19/2016 1650   EOSABS 0.0 09/04/2021 1514   BASOSABS 0.0 09/04/2021 1514    BMET    Component Value Date/Time   NA 142 09/04/2021 1514   K 4.3 09/04/2021 1514   CL 103 09/04/2021 1514   CO2 23 09/04/2021 1514   GLUCOSE 115 (H) 09/04/2021 1514   GLUCOSE 122 (H) 04/09/2019 1134   BUN 13 09/04/2021 1514   CREATININE 0.81 09/04/2021 1514   CREATININE 0.60 01/26/2013 1143   CALCIUM 9.8 09/04/2021 1514   GFRNONAA 98 03/23/2020 1355   GFRAA 113 03/23/2020 1355     BNP No results found for: "BNP"  ProBNP No results found for: "PROBNP"  Imaging: No results found.   Assessment & Plan:   Gout - indomethacin (INDOCIN) 50 MG capsule; Take 1 capsule (50 mg total) by mouth 3 (three) times daily with meals.  Dispense: 90 capsule; Refill: 2 - CBC - Comprehensive metabolic panel  Follow up:  Follow up in 6  months or sooner if needed     Fenton Foy, NP 03/09/2022

## 2022-03-09 NOTE — Patient Instructions (Signed)
1. Acute gout due to other secondary cause involving toe of left foot  - indomethacin (INDOCIN) 50 MG capsule; Take 1 capsule (50 mg total) by mouth 3 (three) times daily with meals.  Dispense: 90 capsule; Refill: 2 - CBC - Comprehensive metabolic panel  Follow up:  Follow up in 6 months or sooner if needed

## 2022-03-09 NOTE — Assessment & Plan Note (Signed)
-   indomethacin (INDOCIN) 50 MG capsule; Take 1 capsule (50 mg total) by mouth 3 (three) times daily with meals.  Dispense: 90 capsule; Refill: 2 - CBC - Comprehensive metabolic panel  Follow up:  Follow up in 6 months or sooner if needed

## 2022-03-10 LAB — COMPREHENSIVE METABOLIC PANEL
ALT: 6 IU/L (ref 0–32)
AST: 13 IU/L (ref 0–40)
Albumin/Globulin Ratio: 1.6 (ref 1.2–2.2)
Albumin: 4.5 g/dL (ref 3.9–4.9)
Alkaline Phosphatase: 94 IU/L (ref 44–121)
BUN/Creatinine Ratio: 20 (ref 12–28)
BUN: 12 mg/dL (ref 8–27)
Bilirubin Total: 0.3 mg/dL (ref 0.0–1.2)
CO2: 22 mmol/L (ref 20–29)
Calcium: 9.5 mg/dL (ref 8.7–10.3)
Chloride: 104 mmol/L (ref 96–106)
Creatinine, Ser: 0.6 mg/dL (ref 0.57–1.00)
Globulin, Total: 2.9 g/dL (ref 1.5–4.5)
Glucose: 93 mg/dL (ref 70–99)
Potassium: 4.9 mmol/L (ref 3.5–5.2)
Sodium: 141 mmol/L (ref 134–144)
Total Protein: 7.4 g/dL (ref 6.0–8.5)
eGFR: 100 mL/min/{1.73_m2} (ref >59)

## 2022-03-10 LAB — CBC
Hematocrit: 39.9 % (ref 34.0–46.6)
Hemoglobin: 12.5 g/dL (ref 11.1–15.9)
MCH: 28.3 pg (ref 26.6–33.0)
MCHC: 31.3 g/dL — ABNORMAL LOW (ref 31.5–35.7)
MCV: 91 fL (ref 79–97)
Platelets: 258 10*3/uL (ref 150–450)
RBC: 4.41 x10E6/uL (ref 3.77–5.28)
RDW: 12.5 % (ref 11.7–15.4)
WBC: 3.4 10*3/uL (ref 3.4–10.8)

## 2022-03-15 ENCOUNTER — Other Ambulatory Visit: Payer: Self-pay

## 2022-03-15 ENCOUNTER — Encounter (HOSPITAL_COMMUNITY): Payer: Self-pay

## 2022-03-15 ENCOUNTER — Ambulatory Visit (HOSPITAL_COMMUNITY)
Admission: EM | Admit: 2022-03-15 | Discharge: 2022-03-15 | Disposition: A | Discharge status: Home / Self Care | Payer: 59 | Attending: Physician Assistant | Admitting: Physician Assistant

## 2022-03-15 ENCOUNTER — Ambulatory Visit (INDEPENDENT_AMBULATORY_CARE_PROVIDER_SITE_OTHER): Payer: 59

## 2022-03-15 DIAGNOSIS — M79661 Pain in right lower leg: Secondary | ICD-10-CM

## 2022-03-15 DIAGNOSIS — S8011XA Contusion of right lower leg, initial encounter: Secondary | ICD-10-CM

## 2022-03-15 MED ORDER — IBUPROFEN 600 MG PO TABS
600.0000 mg | ORAL_TABLET | Freq: Three times a day (TID) | ORAL | 0 refills | Status: DC
  Filled 2022-03-15: qty 30, 10d supply, fill #0

## 2022-03-15 MED ORDER — TIZANIDINE HCL 4 MG PO TABS
4.0000 mg | ORAL_TABLET | Freq: Four times a day (QID) | ORAL | 0 refills | Status: DC | PRN
  Filled 2022-03-15: qty 30, 8d supply, fill #0

## 2022-03-15 NOTE — ED Triage Notes (Signed)
Pt states that she pulled her light leg after tripping over her ottoman. Pt states she has pain in the leg.
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
## 2022-03-15 NOTE — Discharge Instructions (Signed)
Advised to take ibuprofen 600 mg 1 every 6-8 hours with food to help reduce pain and swelling. Advised to use ice therapy, 10 minutes on 20 minutes off, 3-4 times a day over the next several days help reduce the pain and swelling. Advised to follow-up with PCP or return to urgent care if symptoms fail to improve.

## 2022-09-10 ENCOUNTER — Ambulatory Visit (INDEPENDENT_AMBULATORY_CARE_PROVIDER_SITE_OTHER): Payer: Medicare Other | Admitting: Nurse Practitioner

## 2022-09-10 ENCOUNTER — Other Ambulatory Visit: Payer: Self-pay

## 2022-09-10 ENCOUNTER — Encounter: Payer: Self-pay | Admitting: Nurse Practitioner

## 2022-09-10 VITALS — BP 144/91 | HR 90 | Temp 97.1°F | Resp 16 | Ht 60.0 in | Wt 192.2 lb

## 2022-09-10 DIAGNOSIS — I10 Essential (primary) hypertension: Secondary | ICD-10-CM | POA: Diagnosis not present

## 2022-09-10 DIAGNOSIS — L509 Urticaria, unspecified: Secondary | ICD-10-CM

## 2022-09-10 MED ORDER — TIZANIDINE HCL 4 MG PO TABS
4.0000 mg | ORAL_TABLET | Freq: Four times a day (QID) | ORAL | 0 refills | Status: DC | PRN
  Filled 2022-09-10: qty 30, 8d supply, fill #0

## 2022-09-10 MED ORDER — INDOMETHACIN 50 MG PO CAPS
50.0000 mg | ORAL_CAPSULE | Freq: Two times a day (BID) | ORAL | 3 refills | Status: DC
  Filled 2022-09-10: qty 30, 15d supply, fill #0

## 2022-09-10 NOTE — Patient Instructions (Addendum)
1. Hives  - Ambulatory referral to Allergy  -Please use hypoallergenic soaps and laundry detergents for now.   2. Essential hypertension  - CBC - Comprehensive metabolic panel    Follow up:  Follow up in 6 months

## 2022-09-10 NOTE — Assessment & Plan Note (Signed)
-  Ambulatory referral to Allergy  -Please use hypoallergenic soaps and laundry detergents for now.   2. Essential hypertension  - CBC - Comprehensive metabolic panel    Follow up:  Follow up in 6 months

## 2022-09-10 NOTE — Progress Notes (Signed)
$'@Patient'N$  ID: Kaylee [name], female    DOB: 10-05-1955, 67 y.o.   MRN: [id number]  Chief Complaint  Patient presents with   Follow-up    She has been breaking out in welts, not sure if from what she is wearing or detergent. Started about 2 months, not every day. She is using benadryl.     Referring provider: No ref. provider found   HPI  Kaylee [name] 67 y.o. female  has a past medical history of Bell's palsy (age 23), Bell's palsy (08/28/2019), Chronic pain in right ear, Family history of breast cancer, Hyperlipidemia, Hypertension (age 66), and Seasonal allergies. To the [facility] for reevaluation of chronic illness.   Hypertension: Patient here for follow-up of elevated blood pressure. She is not exercising and is not adherent to low salt diet.  Blood pressure is not well controlled at home. Cardiac symptoms none. Patient denies chest pain, claudication, dyspnea, and fatigue.  Cardiovascular risk factors: hypertension and obesity (BMI >= 30 kg/m2). Use of agents associated with hypertension: none. History of target organ damage: none. States that she is compliant with all medications. Last dose of medication yesterday. Regularly takes B/P at home and results are consistent with today's.   Patient complains today of breaking out into hives frequently.  She is unsure if this is from her soap or laundry detergent.  She did notice that yesterday she broke out around her neck for more in a certain necklace.  She did take Benadryl and this is cleared up today.  We discussed that we will refer her for allergy testing.  We discussed that she will need to use hypoallergenic soaps and laundry detergents for now.  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    some locking of jaw, and h/o cerumen impaction   Family history of breast cancer    Daughter is being treated for breast cancer   Hyperlipidemia    Hypertension age 65   Seasonal allergies     Tobacco History: Social History   Tobacco Use  Smoking Status Former   Types: Cigarettes   Quit date: 07/20/1982   Years since quitting: 40.1   Passive exposure: Never  Smokeless Tobacco Never   Counseling given: Not Answered   Outpatient Encounter Medications as of 09/10/2022  Medication Sig   acetaminophen (TYLENOL) 325 MG tablet Take 650 mg by mouth every 6 (six) hours as needed for mild pain or headache.   amLODipine (NORVASC) 10 MG tablet Take 1 tablet (10 mg total) by mouth daily.   hydrochlorothiazide (MICROZIDE) 12.5 MG capsule Take 2 capsules (25 mg total) by mouth daily.   ibuprofen (ADVIL) 600 MG tablet Take 1 tablet (600 mg total) by mouth 3 (three) times daily.   [DISCONTINUED] indomethacin (INDOCIN) 50 MG capsule Take 50 mg by mouth 2 (two) times daily with a meal.   [DISCONTINUED] tiZANidine (ZANAFLEX) 4 MG  tablet Take 1 tablet (4 mg total) by mouth every 6 (six) hours as needed for muscle spasms.   indomethacin (INDOCIN) 50 MG capsule Take 1 capsule (50 mg total) by mouth 2 (two) times daily with a meal.   tiZANidine (ZANAFLEX) 4 MG tablet Take 1 tablet (4 mg total) by mouth every 6 (six) hours as needed for muscle spasms.   [DISCONTINUED] cetirizine (ZYRTEC) 10 MG tablet Take 1 tablet (10 mg total) by mouth daily. (Patient not taking: Reported on 04/09/2019)   [DISCONTINUED] cholecalciferol (VITAMIN D3) 25 MCG (1000 UNIT) tablet Take 1,000 Units by mouth daily. (Patient not taking:  Reported on 05/24/2021)   [DISCONTINUED] hydrocortisone (ANUSOL-HC) 2.5 % rectal cream Place 1 application rectally 2 (two) times daily. (Patient not taking: Reported on 01/08/2022)   [DISCONTINUED] hydroxypropyl methylcellulose / hypromellose (ISOPTO TEARS / GONIOVISC) 2.5 % ophthalmic solution Place 1 drop into the right eye 4 (four) times daily as needed for dry eyes. (Patient not taking: Reported on 05/24/2021)   [DISCONTINUED] Nitroglycerin 0.4 % OINT Place 1 inch rectally 2 (two) times daily as needed. Use up to 3 weeks (Patient not taking: Reported on 09/04/2021)   [DISCONTINUED] PRESCRIPTION MEDICATION as needed. NAME UNKNOWN TAKE MEDICATION FOR GOUT   No facility-administered encounter medications on file as of 09/10/2022.     Review of Systems  Review of Systems  Constitutional: Negative.   HENT: Negative.    Cardiovascular: Negative.   Gastrointestinal: Negative.   Allergic/Immunologic: Negative.   Neurological: Negative.   Psychiatric/Behavioral: Negative.         Physical Exam  BP (!) 144/91   Pulse 90   Temp (!) 97.1 F (36.2 C) (Temporal)   Resp 16   Ht 5' (1.524 m)   Wt 192 lb 3.2 oz (87.2 kg)   SpO2 98%   BMI 37.54 kg/m   Wt Readings from Last 5 Encounters:  09/10/22 192 lb 3.2 oz (87.2 kg)  03/09/22 186 lb 6.4 oz (84.6 kg)  01/08/22 190 lb (86.2 kg)  12/27/21 190 lb (86.2 kg)  12/04/21 190 lb 9.6 oz (86.5 kg)     Physical Exam Vitals and nursing note reviewed.  Constitutional:      General: She is not in acute distress.    Appearance: She is well-developed.  Cardiovascular:     Rate and Rhythm: Normal rate and regular rhythm.  Pulmonary:     Effort: Pulmonary effort is normal.     Breath sounds: Normal breath sounds.  Neurological:     Mental Status: She is alert and oriented to person, place, and time.       Assessment & Plan:   Hives - Ambulatory referral to Allergy  -Please use hypoallergenic soaps and laundry detergents for  now.   2. Essential hypertension  - CBC - Comprehensive metabolic panel    Follow up:  Follow up in 6 months     Fenton Foy, NP 09/10/2022

## 2022-09-11 LAB — COMPREHENSIVE METABOLIC PANEL WITH GFR
ALT: 8 IU/L (ref 0–32)
AST: 13 IU/L (ref 0–40)
Albumin/Globulin Ratio: 1.5 (ref 1.2–2.2)
Albumin: 4.3 g/dL (ref 3.9–4.9)
Alkaline Phosphatase: 89 IU/L (ref 44–121)
BUN/Creatinine Ratio: 24 (ref 12–28)
BUN: 16 mg/dL (ref 8–27)
Bilirubin Total: <0.2 mg/dL (ref 0.0–1.2)
CO2: 20 mmol/L (ref 20–29)
Calcium: 9.3 mg/dL (ref 8.7–10.3)
Chloride: 106 mmol/L (ref 96–106)
Creatinine, Ser: 0.68 mg/dL (ref 0.57–1.00)
Globulin, Total: 2.8 g/dL (ref 1.5–4.5)
Glucose: 106 mg/dL — ABNORMAL HIGH (ref 70–99)
Potassium: 4.2 mmol/L (ref 3.5–5.2)
Sodium: 143 mmol/L (ref 134–144)
Total Protein: 7.1 g/dL (ref 6.0–8.5)
eGFR: 97 mL/min/1.73 (ref >59)

## 2022-09-11 LAB — CBC
Hematocrit: 39.7 % (ref 34.0–46.6)
Hemoglobin: 12.9 g/dL (ref 11.1–15.9)
MCH: 29.7 pg (ref 26.6–33.0)
MCHC: 32.5 g/dL (ref 31.5–35.7)
MCV: 92 fL (ref 79–97)
Platelets: 244 x10E3/uL (ref 150–450)
RBC: 4.34 x10E6/uL (ref 3.77–5.28)
RDW: 12.6 % (ref 11.7–15.4)
WBC: 3.2 x10E3/uL — ABNORMAL LOW (ref 3.4–10.8)

## 2022-09-12 ENCOUNTER — Other Ambulatory Visit: Payer: Self-pay

## 2022-10-22 ENCOUNTER — Ambulatory Visit
Admission: EM | Admit: 2022-10-22 | Discharge: 2022-10-22 | Disposition: A | Discharge status: Home / Self Care | Payer: Medicare HMO | Attending: Physician Assistant | Admitting: Physician Assistant

## 2022-10-22 DIAGNOSIS — H6123 Impacted cerumen, bilateral: Secondary | ICD-10-CM

## 2022-10-22 DIAGNOSIS — T7840XA Allergy, unspecified, initial encounter: Secondary | ICD-10-CM | POA: Diagnosis not present

## 2022-10-22 MED ORDER — FLUTICASONE PROPIONATE 50 MCG/ACT NA SUSP: 2.0000 | Freq: Every day | NASAL | 0 refills | Status: DC

## 2022-10-22 MED ORDER — NEOMYCIN-POLYMYXIN-HC 3.5-10000-1 OT SUSP: 4.0000 [drp] | Freq: Three times a day (TID) | OTIC | 0 refills | Status: DC
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
## 2022-10-22 NOTE — Discharge Instructions (Addendum)
Advised to use the Cortisporin eardrops, 4 drops in the right ear 2-3 times a day for the next 2 to 3 days until soreness resolves.  Advised to continue to take congestion medicine along with Flonase nasal spray, 2 sprays each nostril once daily to help decrease allergies and to help open up the ears so that hearing is better.  Advised follow-up PCP or return to urgent care if symptoms fail to improve.

## 2022-10-22 NOTE — ED Triage Notes (Signed)
Pt presents with bilateral ear fullness and pain

## 2022-11-14 ENCOUNTER — Ambulatory Visit: Payer: Medicare HMO | Admitting: Allergy

## 2023-03-11 ENCOUNTER — Ambulatory Visit (INDEPENDENT_AMBULATORY_CARE_PROVIDER_SITE_OTHER): Payer: Medicare HMO | Admitting: Nurse Practitioner

## 2023-03-11 ENCOUNTER — Other Ambulatory Visit: Payer: Self-pay

## 2023-03-11 ENCOUNTER — Other Ambulatory Visit (HOSPITAL_COMMUNITY)
Admission: RE | Admit: 2023-03-11 | Discharge: 2023-03-11 | Disposition: A | Discharge status: Home / Self Care | Payer: Medicare HMO | Source: Ambulatory Visit | Attending: Nurse Practitioner | Admitting: Nurse Practitioner

## 2023-03-11 ENCOUNTER — Encounter: Payer: Self-pay | Admitting: Nurse Practitioner

## 2023-03-11 VITALS — BP 148/90 | HR 75 | Temp 97.0°F | Ht 60.0 in | Wt 195.6 lb

## 2023-03-11 DIAGNOSIS — Z1231 Encounter for screening mammogram for malignant neoplasm of breast: Secondary | ICD-10-CM | POA: Diagnosis not present

## 2023-03-11 DIAGNOSIS — Z13220 Encounter for screening for lipoid disorders: Secondary | ICD-10-CM

## 2023-03-11 DIAGNOSIS — Z13820 Encounter for screening for osteoporosis: Secondary | ICD-10-CM

## 2023-03-11 DIAGNOSIS — I10 Essential (primary) hypertension: Secondary | ICD-10-CM

## 2023-03-11 DIAGNOSIS — Z124 Encounter for screening for malignant neoplasm of cervix: Secondary | ICD-10-CM

## 2023-03-11 MED ORDER — HYDROCHLOROTHIAZIDE 12.5 MG PO CAPS
25.0000 mg | ORAL_CAPSULE | Freq: Every day | ORAL | 0 refills | Status: DC
  Filled 2023-03-11: qty 180, 90d supply, fill #0

## 2023-03-11 MED ORDER — AMLODIPINE BESYLATE 10 MG PO TABS
10.0000 mg | ORAL_TABLET | Freq: Every day | ORAL | 3 refills | Status: DC
  Filled 2023-03-11: qty 90, 90d supply, fill #0

## 2023-03-11 MED ORDER — TIZANIDINE HCL 4 MG PO TABS
4.0000 mg | ORAL_TABLET | Freq: Four times a day (QID) | ORAL | 0 refills | Status: DC | PRN
  Filled 2023-03-11: qty 30, 8d supply, fill #0

## 2023-03-11 NOTE — Assessment & Plan Note (Signed)
-   amLODipine (NORVASC) 10 MG tablet; Take 1 tablet (10 mg total) by mouth daily.  Dispense: 90 tablet; Refill: 3 - hydrochlorothiazide (MICROZIDE) 12.5 MG capsule; Take 2 capsules (25 mg total) by mouth daily.  Dispense: 180 capsule; Refill: 0   2. Osteoporosis screening  - HM DEXA SCAN   3. Cervical cancer screening  - Cytology - PAP([place])   4. Encounter for screening mammogram for malignant neoplasm of breast  - MM 3D SCREENING MAMMOGRAM BILATERAL BREAST    Follow up:  Follow up in 6 months

## 2023-03-11 NOTE — Progress Notes (Addendum)
@Patient  ID: Kaylee [name], female    DOB: 1955/12/01, 67 y.o.   MRN: [id number]  Chief Complaint  Patient presents with   Hypertension    Pt has not taken med today . Not checking b/p at home    Referring provider: Ivonne Andrew, NP   HPI   Kaylee [name] 67 y.o. female  has a past medical history of Bell's palsy (age 54), Bell's palsy (08/28/2019), Chronic pain in right ear, Family history of breast cancer, Hyperlipidemia, Hypertension (age 40), and Seasonal allergies. To the [facility] for reevaluation of chronic illness.   Hypertension: Patient here for follow-up of elevated blood pressure. She is not exercising and is adherent to low salt diet.  Blood pressure is not well controlled at home. Cardiac symptoms none. Patient denies chest pain, claudication, dyspnea, and fatigue.  Cardiovascular risk factors: hypertension and obesity (BMI >= 30 kg/m2). Use of agents associated with hypertension: none. History of target organ damage: none. States that she is compliant with all medications. Last dose of medication yesterday. Did not take today.   Patient is due for PAP today.  Is not up to date on mammogram.   Allergies  Allergen Reactions   Codeine Nausea And Vomiting   Gabapentin (Once-Daily) Hives   Tomato Itching    Immunization History  Administered Date(s) Administered   PFIZER(Purple Top)SARS-COV-2 Vaccination 08/18/2020, 09/08/2020   Tdap 12/29/2012    Past Medical History:  Diagnosis Date   Allergy    SEASONAL   Arthritis    HANDS,ARMS   Bell's palsy age 52   residual right sided weakness   Bell's palsy 08/28/2019   2nd episode, lost speech this time   Chronic pain in right ear    some locking of jaw, and h/o cerumen impaction   Family history of breast cancer    Daughter is being treated for breast cancer   Hyperlipidemia    Hypertension age 40   Seasonal allergies     Tobacco History: Social History   Tobacco Use  Smoking Status Former   Current  packs/day: 0.00   Types: Cigarettes   Quit date: 07/20/1982   Years since quitting: 40.6   Passive exposure: Never  Smokeless Tobacco Never   Counseling given: Not Answered   Outpatient Encounter Medications as of 03/11/2023  Medication Sig   acetaminophen (TYLENOL) 325 MG tablet Take 650 mg by mouth every 6 (six) hours as needed for mild pain or headache.   [DISCONTINUED] amLODipine (NORVASC) 10 MG tablet Take 1 tablet (10 mg total) by mouth daily.   [DISCONTINUED] hydrochlorothiazide (MICROZIDE) 12.5 MG capsule Take 2 capsules (25 mg total) by mouth daily.   [DISCONTINUED] tiZANidine (ZANAFLEX) 4 MG tablet Take 1 tablet (4 mg total) by mouth every 6 (six) hours as needed for muscle spasms.   amLODipine (NORVASC) 10 MG tablet Take 1 tablet (10 mg total) by mouth daily.   fluticasone (FLONASE) 50 MCG/ACT nasal spray Place 2 sprays into both nostrils daily. (Patient not taking: Reported on 03/11/2023)   hydrochlorothiazide (MICROZIDE) 12.5 MG capsule Take 2 capsules (25 mg total) by mouth daily.   ibuprofen (ADVIL) 600 MG tablet Take 1 tablet (600 mg total) by mouth 3 (three) times daily. (Patient not taking: Reported on 03/11/2023)   indomethacin (INDOCIN) 50 MG capsule Take 1 capsule (50 mg total) by mouth 2 (two) times daily with a meal.   neomycin-polymyxin-hydrocortisone (CORTISPORIN) 3.5-10000-1 OTIC suspension Place 4 drops into the right ear 3 (three) times  daily. (Patient not taking: Reported on 03/11/2023)   tiZANidine (ZANAFLEX) 4 MG tablet Take 1 tablet (4 mg total) by mouth every 6 (six) hours as needed for muscle spasms.   [DISCONTINUED] cetirizine (ZYRTEC) 10 MG tablet Take 1 tablet (10 mg total) by mouth daily. (Patient not taking: Reported on 04/09/2019)   No facility-administered encounter medications on file as of 03/11/2023.     Review of Systems  Review of Systems  Constitutional: Negative.   HENT: Negative.    Cardiovascular: Negative.   Gastrointestinal: Negative.    Allergic/Immunologic: Negative.   Neurological: Negative.   Psychiatric/Behavioral: Negative.         Physical Exam  BP (!) 148/90   Pulse 75   Temp (!) 97 F (36.1 C)   Ht 5' (1.524 m)   Wt 195 lb 9.6 oz (88.7 kg)   SpO2 100%   BMI 38.20 kg/m   Wt Readings from Last 5 Encounters:  03/11/23 195 lb 9.6 oz (88.7 kg)  09/10/22 192 lb 3.2 oz (87.2 kg)  03/09/22 186 lb 6.4 oz (84.6 kg)  01/08/22 190 lb (86.2 kg)  12/27/21 190 lb (86.2 kg)     Physical Exam Vitals and nursing note reviewed. Exam conducted with a chaperone present.  Constitutional:      General: She is not in acute distress.    Appearance: She is well-developed.  Cardiovascular:     Rate and Rhythm: Normal rate and regular rhythm.  Pulmonary:     Effort: Pulmonary effort is normal.     Breath sounds: Normal breath sounds.  Genitourinary:    General: Normal vulva.     Vagina: Normal.     Cervix: Normal.     Uterus: Normal.      Rectum: Normal.  Neurological:     Mental Status: She is alert and oriented to person, place, and time.      Lab Results:  CBC    Component Value Date/Time   WBC 3.2 (L) 09/10/2022 1055   WBC 2.4 (L) 04/09/2019 1134   RBC 4.34 09/10/2022 1055   RBC 4.43 04/09/2019 1134   HGB 12.9 09/10/2022 1055   HCT 39.7 09/10/2022 1055   PLT 244 09/10/2022 1055   MCV 92 09/10/2022 1055   MCH 29.7 09/10/2022 1055   MCH 29.6 04/09/2019 1134   MCHC 32.5 09/10/2022 1055   MCHC 31.0 04/09/2019 1134   RDW 12.6 09/10/2022 1055   LYMPHSABS 1.4 09/04/2021 1514   MONOABS 0.1 11/19/2016 1650   EOSABS 0.0 09/04/2021 1514   BASOSABS 0.0 09/04/2021 1514    BMET    Component Value Date/Time   NA 143 09/10/2022 1055   K 4.2 09/10/2022 1055   CL 106 09/10/2022 1055   CO2 20 09/10/2022 1055   GLUCOSE 106 (H) 09/10/2022 1055   GLUCOSE 122 (H) 04/09/2019 1134   BUN 16 09/10/2022 1055   CREATININE 0.68 09/10/2022 1055   CREATININE 0.60 01/26/2013 1143   CALCIUM 9.3 09/10/2022 1055    GFRNONAA 98 03/23/2020 1355   GFRAA 113 03/23/2020 1355     Assessment & Plan:   Essential hypertension - amLODipine (NORVASC) 10 MG tablet; Take 1 tablet (10 mg total) by mouth daily.  Dispense: 90 tablet; Refill: 3 - hydrochlorothiazide (MICROZIDE) 12.5 MG capsule; Take 2 capsules (25 mg total) by mouth daily.  Dispense: 180 capsule; Refill: 0   2. Osteoporosis screening  - HM DEXA SCAN   3. Cervical cancer screening  - Cytology - PAP([facility])   4. Encounter for screening mammogram for malignant neoplasm of breast  - MM 3D SCREENING MAMMOGRAM BILATERAL BREAST    Follow up:  Follow up in 6 months     Ivonne Andrew, NP 03/11/2023

## 2023-03-11 NOTE — Patient Instructions (Addendum)
1. Essential hypertension  - amLODipine (NORVASC) 10 MG tablet; Take 1 tablet (10 mg total) by mouth daily.  Dispense: 90 tablet; Refill: 3 - hydrochlorothiazide (MICROZIDE) 12.5 MG capsule; Take 2 capsules (25 mg total) by mouth daily.  Dispense: 180 capsule; Refill: 0   2. Osteoporosis screening  - HM DEXA SCAN   3. Cervical cancer screening  - Cytology - PAP([place])   4. Encounter for screening mammogram for malignant neoplasm of breast  - MM 3D SCREENING MAMMOGRAM BILATERAL BREAST    Follow up:  Follow up in 6 months

## 2023-03-12 ENCOUNTER — Other Ambulatory Visit: Payer: Self-pay

## 2023-03-12 LAB — LIPID PANEL
Chol/HDL Ratio: 3.7 ratio (ref 0.0–4.4)
Cholesterol, Total: 258 mg/dL — ABNORMAL HIGH (ref 100–199)
HDL: 69 mg/dL (ref >39)
LDL Chol Calc (NIH): 170 mg/dL — ABNORMAL HIGH (ref 0–99)
Triglycerides: 107 mg/dL (ref 0–149)
VLDL Cholesterol Cal: 19 mg/dL (ref 5–40)

## 2023-03-12 LAB — COMPREHENSIVE METABOLIC PANEL
ALT: 8 IU/L (ref 0–32)
AST: 17 IU/L (ref 0–40)
Albumin: 4.6 g/dL (ref 3.9–4.9)
Alkaline Phosphatase: 90 IU/L (ref 44–121)
BUN/Creatinine Ratio: 18 (ref 12–28)
BUN: 13 mg/dL (ref 8–27)
Bilirubin Total: 0.3 mg/dL (ref 0.0–1.2)
CO2: 22 mmol/L (ref 20–29)
Calcium: 9.8 mg/dL (ref 8.7–10.3)
Chloride: 103 mmol/L (ref 96–106)
Creatinine, Ser: 0.73 mg/dL (ref 0.57–1.00)
Globulin, Total: 3 g/dL (ref 1.5–4.5)
Glucose: 114 mg/dL — ABNORMAL HIGH (ref 70–99)
Potassium: 4.3 mmol/L (ref 3.5–5.2)
Sodium: 141 mmol/L (ref 134–144)
Total Protein: 7.6 g/dL (ref 6.0–8.5)
eGFR: 91 mL/min/{1.73_m2} (ref >59)

## 2023-03-12 LAB — CBC
Hematocrit: 40.8 % (ref 34.0–46.6)
Hemoglobin: 13.4 g/dL (ref 11.1–15.9)
MCH: 30.4 pg (ref 26.6–33.0)
MCHC: 32.8 g/dL (ref 31.5–35.7)
MCV: 93 fL (ref 79–97)
Platelets: 279 10*3/uL (ref 150–450)
RBC: 4.41 x10E6/uL (ref 3.77–5.28)
RDW: 12.6 % (ref 11.7–15.4)
WBC: 4 10*3/uL (ref 3.4–10.8)

## 2023-03-13 ENCOUNTER — Other Ambulatory Visit: Payer: Self-pay | Admitting: Nurse Practitioner

## 2023-03-13 ENCOUNTER — Other Ambulatory Visit: Payer: Self-pay

## 2023-03-13 LAB — CYTOLOGY - PAP: Diagnosis: NEGATIVE

## 2023-03-13 MED ORDER — ROSUVASTATIN CALCIUM 10 MG PO TABS
10.0000 mg | ORAL_TABLET | Freq: Every day | ORAL | 11 refills | Status: DC
  Filled 2023-03-13 – 2023-03-20 (×2): qty 30, 30d supply, fill #0
  Filled 2023-06-28: qty 30, 30d supply, fill #1

## 2023-03-20 ENCOUNTER — Other Ambulatory Visit: Payer: Self-pay

## 2023-03-27 ENCOUNTER — Ambulatory Visit
Admission: RE | Admit: 2023-03-27 | Discharge: 2023-03-27 | Disposition: A | Discharge status: Home / Self Care | Payer: Medicare HMO | Source: Ambulatory Visit | Attending: Nurse Practitioner | Admitting: Nurse Practitioner

## 2023-04-04 ENCOUNTER — Ambulatory Visit (INDEPENDENT_AMBULATORY_CARE_PROVIDER_SITE_OTHER): Payer: Medicare HMO

## 2023-04-04 VITALS — Ht 60.0 in | Wt 195.0 lb

## 2023-04-04 DIAGNOSIS — Z780 Asymptomatic menopausal state: Secondary | ICD-10-CM

## 2023-04-04 DIAGNOSIS — Z0000 Encounter for general adult medical examination without abnormal findings: Secondary | ICD-10-CM | POA: Diagnosis not present

## 2023-04-04 DIAGNOSIS — Z0100 Encounter for examination of eyes and vision without abnormal findings: Secondary | ICD-10-CM
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
## 2023-04-04 NOTE — Patient Instructions (Addendum)
Kaylee [name] , Thank you for taking time to come for your Medicare Wellness Visit. I appreciate your ongoing commitment to your health goals. Please review the following plan we discussed and let me know if I can assist you in the future.   These are the goals we discussed:  Goals      Patient Stated     Remain active and independent         This is a list of the screening recommended for you and due dates:  Health Maintenance  Topic Date Due   Zoster (Shingles) Vaccine (1 of 2) Never done   COVID-19 Vaccine (3 - 2023-24 season) 04/20/2022   DTaP/Tdap/Td vaccine (2 - Td or Tdap) 12/30/2022   Flu Shot  03/21/2023   Pneumonia Vaccine (1 of 1 - PCV) 09/11/2023*   Mammogram  03/26/2024   Medicare Annual Wellness Visit  04/03/2024   Colon Cancer Screening  01/08/2025   Pap Smear  03/10/2026   DEXA scan (bone density measurement)  Completed   Hepatitis C Screening  Completed   HIV Screening  Completed   HPV Vaccine  Aged Out  *Topic was postponed. The date shown is not the original due date.    Advanced directives: Advance directive discussed with you today. Even though you declined this today, please call our office should you change your mind, and we can give you the proper paperwork for you to fill out. Advance care planning is a way to make decisions about medical care that fits your values in case you are ever unable to make these decisions for yourself.  Information on Advanced Care Planning can be found at [facility] Advance Health Care Directives Advance Health Care Directives ([email or web address]    Conditions/risks identified:  Vaccinations: declines all Influenza vaccine: recommend every Fall Pneumococcal vaccine: recommend once per lifetime Prevnar-20 Tdap vaccine: recommend every 10 years Shingles vaccine: recommend Shingrix which is 2 doses 2-6 months apart and over 90% effective     Covid-19: recommend 2 doses one month apart with a booster 6 months  later   Bone Density Test A bone density test uses a type of X-ray to measure the amount of calcium and other minerals in a person's bones. It can measure bone density in the hip and the spine. The test is similar to having a regular X-ray. This test may also be called: Bone densitometry. Bone mineral density test. Dual-energy X-ray absorptiometry (DEXA). You may have this test to: Diagnose a condition that causes weak or thin bones (osteoporosis). Screen you for osteoporosis. Predict your risk for a broken bone (fracture). Determine how well your osteoporosis treatment is working. Tell a health care provider about: Any allergies you have. All medicines you are taking, including vitamins, herbs, eye drops, creams, and over-the-counter medicines. Any problems you or family members have had with anesthetic medicines. Any blood disorders you have. Any surgeries you have had. Any medical conditions you have. Whether you are pregnant or may be pregnant. Any medical tests you have had within the past 14 days that used contrast material. What are the risks? Generally, this is a safe test. However, it does expose you to a small amount of radiation, which can slightly increase your cancer risk. What happens before the test? Do not take any calcium supplements within the 24 hours before your test. You will need to remove all metal jewelry, eyeglasses, removable dental appliances, and any other metal objects on your body. What happens  during the test?  You will lie down on an exam table. There will be an X-ray generator below you and an imaging device above you. Other devices, such as boxes or braces, may be used to position your body properly for the scan. The machine will slowly scan your body. You will need to keep very still while the machine does the scan. The images will show up on a screen in the room. Images will be examined by a specialist after your test is finished. The procedure may  vary among health care providers and hospitals. What can I expect after the test? It is up to you to get the results of your test. Ask your health care provider, or the department that is doing the test, when your results will be ready. Summary A bone density test is an imaging test that uses a type of X-ray to measure the amount of calcium and other minerals in your bones. The test may be used to diagnose or screen you for a condition that causes weak or thin bones (osteoporosis), predict your risk for a broken bone (fracture), or determine how well your osteoporosis treatment is working. Do not take any calcium supplements within 24 hours before your test. Ask your health care provider, or the department that is doing the test, when your results will be ready. This information is not intended to replace advice given to you by your health care provider. Make sure you discuss any questions you have with your health care provider. Document Revised: 04/19/2021 Document Reviewed: 01/21/2020 Elsevier Patient Education  2024 Elsevier Inc.  You have been referred to [facility] for a complete eye exam. If you haven't heard from them in a few days, please call them to schedule your appointment.  [facility] [address] [place] [address] Phone: [phone]  Next appointment: VIRTUAL/TELEPHONE APPOINTMENT Follow up in one year for your annual wellness visit  April 09, 2024 at 1:30 am telephone visit.    Preventive Care 65 Years and Older, Female Preventive care refers to lifestyle choices and visits with your health care provider that can promote health and wellness. What does preventive care include? A yearly physical exam. This is also called an annual well check. Dental exams once or twice a year. Routine eye exams. Ask your health care provider how often you should have your eyes checked. Personal lifestyle choices, including: Daily care of your teeth and gums. Regular  physical activity. Eating a healthy diet. Avoiding tobacco and drug use. Limiting alcohol use. Practicing safe sex. Taking low-dose aspirin every day. Taking vitamin and mineral supplements as recommended by your health care provider. What happens during an annual well check? The services and screenings done by your health care provider during your annual well check will depend on your age, overall health, lifestyle risk factors, and family history of disease. Counseling  Your health care provider may ask you questions about your: Alcohol use. Tobacco use. Drug use. Emotional well-being. Home and relationship well-being. Sexual activity. Eating habits. History of falls. Memory and ability to understand (cognition). Work and work Astronomer. Reproductive health. Screening  You may have the following tests or measurements: Height, weight, and BMI. Blood pressure. Lipid and cholesterol levels. These may be checked every 5 years, or more frequently if you are over 97 years old. Skin check. Lung cancer screening. You may have this screening every year starting at age 70 if you have a 30-pack-year history of smoking  and currently smoke or have quit within the past 15 years. Fecal occult blood test (FOBT) of the stool. You may have this test every year starting at age 36. Flexible sigmoidoscopy or colonoscopy. You may have a sigmoidoscopy every 5 years or a colonoscopy every 10 years starting at age 33. Hepatitis C blood test. Hepatitis B blood test. Sexually transmitted disease (STD) testing. Diabetes screening. This is done by checking your blood sugar (glucose) after you have not eaten for a while (fasting). You may have this done every 1-3 years. Bone density scan. This is done to screen for osteoporosis. You may have this done starting at age 41. Mammogram. This may be done every 1-2 years. Talk to your health care provider about how often you should have regular mammograms. Talk  with your health care provider about your test results, treatment options, and if necessary, the need for more tests. Vaccines  Your health care provider may recommend certain vaccines, such as: Influenza vaccine. This is recommended every year. Tetanus, diphtheria, and acellular pertussis (Tdap, Td) vaccine. You may need a Td booster every 10 years. Zoster vaccine. You may need this after age 31. Pneumococcal 13-valent conjugate (PCV13) vaccine. One dose is recommended after age 3. Pneumococcal polysaccharide (PPSV23) vaccine. One dose is recommended after age 24. Talk to your health care provider about which screenings and vaccines you need and how often you need them. This information is not intended to replace advice given to you by your health care provider. Make sure you discuss any questions you have with your health care provider. Document Released: 09/02/2015 Document Revised: 04/25/2016 Document Reviewed: 06/07/2015 Elsevier Interactive Patient Education  2017 [employer].  Fall Prevention in the Home Falls can cause injuries. They can happen to people of all ages. There are many things you can do to make your home safe and to help prevent falls. What can I do on the outside of my home? Regularly fix the edges of walkways and driveways and fix any cracks. Remove anything that might make you trip as you walk through a door, such as a raised step or threshold. Trim any bushes or trees on the path to your home. Use bright outdoor lighting. Clear any walking paths of anything that might make someone trip, such as rocks or tools. Regularly check to see if handrails are loose or broken. Make sure that both sides of any steps have handrails. Any raised decks and porches should have guardrails on the edges. Have any leaves, snow, or ice cleared regularly. Use sand or salt on walking paths during winter. Clean up any spills in your garage right away. This includes oil or grease  spills. What can I do in the bathroom? Use night lights. Install grab bars by the toilet and in the tub and shower. Do not use towel bars as grab bars. Use non-skid mats or decals in the tub or shower. If you need to sit down in the shower, use a plastic, non-slip stool. Keep the floor dry. Clean up any water that spills on the floor as soon as it happens. Remove soap buildup in the tub or shower regularly. Attach bath mats securely with double-sided non-slip rug tape. Do not have throw rugs and other things on the floor that can make you trip. What can I do in the bedroom? Use night lights. Make sure that you have a light by your bed that is easy to reach. Do not use any sheets or blankets that are  too big for your bed. They should not hang down onto the floor. Have a firm chair that has side arms. You can use this for support while you get dressed. Do not have throw rugs and other things on the floor that can make you trip. What can I do in the kitchen? Clean up any spills right away. Avoid walking on wet floors. Keep items that you use a lot in easy-to-reach places. If you need to reach something above you, use a strong step stool that has a grab bar. Keep electrical cords out of the way. Do not use floor polish or wax that makes floors slippery. If you must use wax, use non-skid floor wax. Do not have throw rugs and other things on the floor that can make you trip. What can I do with my stairs? Do not leave any items on the stairs. Make sure that there are handrails on both sides of the stairs and use them. Fix handrails that are broken or loose. Make sure that handrails are as long as the stairways. Check any carpeting to make sure that it is firmly attached to the stairs. Fix any carpet that is loose or worn. Avoid having throw rugs at the top or bottom of the stairs. If you do have throw rugs, attach them to the floor with carpet tape. Make sure that you have a light switch at the  top of the stairs and the bottom of the stairs. If you do not have them, ask someone to add them for you. What else can I do to help prevent falls? Wear shoes that: Do not have high heels. Have rubber bottoms. Are comfortable and fit you well. Are closed at the toe. Do not wear sandals. If you use a stepladder: Make sure that it is fully opened. Do not climb a closed stepladder. Make sure that both sides of the stepladder are locked into place. Ask someone to hold it for you, if possible. Clearly mark and make sure that you can see: Any grab bars or handrails. First and last steps. Where the edge of each step is. Use tools that help you move around (mobility aids) if they are needed. These include: Canes. Walkers. Scooters. Crutches. Turn on the lights when you go into a dark area. Replace any light bulbs as soon as they burn out. Set up your furniture so you have a clear path. Avoid moving your furniture around. If any of your floors are uneven, fix them. If there are any pets around you, be aware of where they are. Review your medicines with your doctor. Some medicines can make you feel dizzy. This can increase your chance of falling. Ask your doctor what other things that you can do to help prevent falls. This information is not intended to replace advice given to you by your health care provider. Make sure you discuss any questions you have with your health care provider. Document Released: 06/02/2009 Document Revised: 01/12/2016 Document Reviewed: 09/10/2014 Elsevier Interactive Patient Education  2017 [employer].

## 2023-04-24 ENCOUNTER — Other Ambulatory Visit: Payer: Self-pay

## 2023-04-24 MED ORDER — TIZANIDINE HCL 4 MG PO TABS
4.0000 mg | ORAL_TABLET | Freq: Four times a day (QID) | ORAL | 0 refills | Status: DC | PRN
  Filled 2023-04-24: qty 30, 8d supply, fill #0

## 2023-04-24 NOTE — Telephone Encounter (Signed)
Please advise KH 

## 2023-04-26 ENCOUNTER — Other Ambulatory Visit: Payer: Self-pay

## 2023-06-09 ENCOUNTER — Ambulatory Visit: Payer: Medicare HMO

## 2023-06-09 ENCOUNTER — Encounter: Payer: Self-pay | Admitting: *Deleted

## 2023-06-09 ENCOUNTER — Ambulatory Visit
Admission: EM | Admit: 2023-06-09 | Discharge: 2023-06-09 | Disposition: A | Discharge status: Home / Self Care | Payer: Medicare HMO | Attending: Internal Medicine | Admitting: Internal Medicine

## 2023-06-09 ENCOUNTER — Other Ambulatory Visit: Payer: Self-pay

## 2023-06-09 DIAGNOSIS — J069 Acute upper respiratory infection, unspecified: Secondary | ICD-10-CM | POA: Insufficient documentation

## 2023-06-09 DIAGNOSIS — R112 Nausea with vomiting, unspecified: Secondary | ICD-10-CM | POA: Diagnosis not present

## 2023-06-09 DIAGNOSIS — Z20822 Contact with and (suspected) exposure to covid-19: Secondary | ICD-10-CM | POA: Insufficient documentation

## 2023-06-09 DIAGNOSIS — R059 Cough, unspecified: Secondary | ICD-10-CM | POA: Diagnosis not present

## 2023-06-09 LAB — POCT INFLUENZA A/B
Influenza A, POC: NEGATIVE
Influenza B, POC: NEGATIVE

## 2023-06-09 MED ORDER — BENZONATATE 100 MG PO CAPS: 100.0000 mg | ORAL_CAPSULE | Freq: Three times a day (TID) | ORAL | 0 refills | Status: DC | PRN

## 2023-06-09 MED ORDER — ALBUTEROL SULFATE (2.5 MG/3ML) 0.083% IN NEBU
2.5000 mg | INHALATION_SOLUTION | Freq: Once | RESPIRATORY_TRACT | Status: AC
  Administered 2023-06-09: 2.5 mg via RESPIRATORY_TRACT

## 2023-06-09 MED ORDER — ACETAMINOPHEN 325 MG PO TABS
650.0000 mg | ORAL_TABLET | Freq: Once | ORAL | Status: AC
  Administered 2023-06-09: 650 mg via ORAL

## 2023-06-09 MED ORDER — METHYLPREDNISOLONE ACETATE 80 MG/ML IJ SUSP
80.0000 mg | Freq: Once | INTRAMUSCULAR | Status: AC
  Administered 2023-06-09: 80 mg via INTRAMUSCULAR

## 2023-06-09 MED ORDER — ONDANSETRON 4 MG PO TBDP: 4.0000 mg | ORAL_TABLET | Freq: Three times a day (TID) | ORAL | 0 refills | Status: DC | PRN

## 2023-06-09 MED ORDER — PREDNISONE 20 MG PO TABS: 40.0000 mg | ORAL_TABLET | Freq: Every day | ORAL | 0 refills | Status: AC

## 2023-06-09 MED ORDER — ONDANSETRON HCL 4 MG/2ML IJ SOLN
4.0000 mg | Freq: Once | INTRAMUSCULAR | Status: AC
  Administered 2023-06-09: 4 mg via INTRAMUSCULAR

## 2023-06-09 NOTE — Discharge Instructions (Signed)
Suspect viral cause to your symptoms.  I have prescribed you 3 medications to help alleviate symptoms.  Please start prednisone tomorrow.  Ensure adequate fluids and rest.  Follow-up if any symptoms persist or worsen.
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
## 2023-06-09 NOTE — ED Triage Notes (Signed)
Kaylee Salter, NP at bedside to assess pt before triage- pt c/o cough congestion, nausea

## 2023-06-10 LAB — SARS CORONAVIRUS 2 (TAT 6-24 HRS): SARS Coronavirus 2: NEGATIVE

## 2023-06-28 ENCOUNTER — Other Ambulatory Visit: Payer: Self-pay | Admitting: Nurse Practitioner

## 2023-06-28 ENCOUNTER — Other Ambulatory Visit: Payer: Self-pay

## 2023-06-28 MED ORDER — TIZANIDINE HCL 4 MG PO TABS
4.0000 mg | ORAL_TABLET | Freq: Four times a day (QID) | ORAL | 0 refills | Status: DC | PRN
  Filled 2023-06-28 (×2): qty 30, 8d supply, fill #0

## 2023-07-03 ENCOUNTER — Other Ambulatory Visit: Payer: Self-pay

## 2023-09-11 ENCOUNTER — Encounter: Payer: Self-pay | Admitting: Nurse Practitioner

## 2023-09-11 ENCOUNTER — Other Ambulatory Visit: Payer: Self-pay

## 2023-09-11 ENCOUNTER — Ambulatory Visit (INDEPENDENT_AMBULATORY_CARE_PROVIDER_SITE_OTHER): Payer: Medicare HMO | Admitting: Nurse Practitioner

## 2023-09-11 VITALS — BP 134/83 | HR 85 | Temp 97.4°F | Wt 194.0 lb

## 2023-09-11 DIAGNOSIS — I10 Essential (primary) hypertension: Secondary | ICD-10-CM

## 2023-09-11 DIAGNOSIS — Z1329 Encounter for screening for other suspected endocrine disorder: Secondary | ICD-10-CM

## 2023-09-11 DIAGNOSIS — Z13220 Encounter for screening for lipoid disorders: Secondary | ICD-10-CM | POA: Diagnosis not present

## 2023-09-11 MED ORDER — INDOMETHACIN 50 MG PO CAPS
50.0000 mg | ORAL_CAPSULE | Freq: Two times a day (BID) | ORAL | 3 refills | Status: DC
  Filled 2023-09-11: qty 30, 15d supply, fill #0

## 2023-09-11 MED ORDER — TIZANIDINE HCL 4 MG PO TABS
4.0000 mg | ORAL_TABLET | Freq: Four times a day (QID) | ORAL | 0 refills | Status: DC | PRN
  Filled 2023-09-11: qty 30, 8d supply, fill #0

## 2023-09-11 MED ORDER — ALLOPURINOL 100 MG PO TABS
100.0000 mg | ORAL_TABLET | Freq: Every day | ORAL | 2 refills | Status: DC
  Filled 2023-09-11: qty 30, 30d supply, fill #0

## 2023-09-11 MED ORDER — HYDROCHLOROTHIAZIDE 12.5 MG PO CAPS
25.0000 mg | ORAL_CAPSULE | Freq: Every day | ORAL | 0 refills | Status: DC
  Filled 2023-09-11: qty 180, 90d supply, fill #0

## 2023-09-11 MED ORDER — AMLODIPINE BESYLATE 10 MG PO TABS
10.0000 mg | ORAL_TABLET | Freq: Every day | ORAL | 3 refills | Status: DC
  Filled 2023-09-11: qty 90, 90d supply, fill #0

## 2023-09-11 NOTE — Patient Instructions (Addendum)
1. Thyroid disorder screen (Primary)  - TSH   2. Essential hypertension  - CBC - Comprehensive metabolic panel   3. Lipid screening  - Lipid Panel    Follow up:  Follow up in 6 months

## 2023-09-11 NOTE — Progress Notes (Signed)
Subjective   Patient ID: Kaylee [name], female    DOB: 1956-02-20, 68 y.o.   MRN: [id number]  Chief Complaint  Patient presents with   Medical Management of Chronic Issues    Mediation refills      Referring provider: Ivonne Andrew, NP  Kaylee [name] is a 68 y.o. 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 She is not exercising and is adherent to low salt diet.  Blood pressure is not well controlled at home. Cardiac symptoms none. Patient denies chest pain, claudication, dyspnea, and fatigue.  Cardiovascular risk factors: hypertension and obesity (BMI >= 30 kg/m2). Use of agents associated with hypertension: none. History of target organ damage: none. States that she is compliant with all medications. Last dose of medication yesterday. Did not take today. Denies f/c/s, n/v/d, hemoptysis, PND, leg swelling. Denies chest pain or edema.      Allergies  Allergen Reactions   Codeine Nausea And Vomiting   Gabapentin (Once-Daily) Hives   Tomato Itching    Immunization History  Administered Date(s) Administered   PFIZER(Purple Top)SARS-COV-2 Vaccination 08/18/2020, 09/08/2020   Tdap 12/29/2012    Tobacco History: Social History   Tobacco Use  Smoking Status Former   Current packs/day: 0.00   Types: Cigarettes   Quit date: 07/20/1982   Years since quitting: 41.1   Passive exposure: Never  Smokeless Tobacco  Never   Counseling given: Not Answered   Outpatient Encounter Medications as of 09/11/2023  Medication Sig   rosuvastatin (CRESTOR) 10 MG tablet Take 1 tablet (10 mg total) by mouth daily.   [DISCONTINUED] allopurinol (ZYLOPRIM) 100 MG tablet Take 100 mg by mouth daily.   [DISCONTINUED] amLODipine (NORVASC) 10 MG tablet Take 1 tablet (10 mg total) by mouth daily.   [DISCONTINUED] hydrochlorothiazide (MICROZIDE) 12.5 MG capsule Take 2 capsules (25 mg total) by mouth daily.   [DISCONTINUED] tiZANidine (ZANAFLEX) 4 MG tablet Take 1 tablet (4 mg total) by mouth every 6 (six) hours as needed for muscle spasms.   acetaminophen (TYLENOL) 325 MG tablet Take 650 mg by mouth every 6 (six) hours as needed for mild pain or headache. (Patient not taking: Reported on 09/11/2023)   allopurinol (ZYLOPRIM) 100 MG tablet Take 1 tablet (100 mg total) by mouth daily.   amLODipine (NORVASC) 10 MG tablet Take 1 tablet (10 mg total) by mouth daily.   benzonatate (TESSALON) 100 MG capsule Take 1 capsule (100 mg total) by mouth every 8 (eight) hours as needed for cough. (Patient not taking: Reported on 09/11/2023)   fluticasone (FLONASE) 50 MCG/ACT nasal spray Place 2 sprays into both nostrils daily. (Patient not taking: Reported on 09/11/2023)   hydrochlorothiazide (MICROZIDE) 12.5 MG capsule Take 2 capsules (25 mg total) by mouth daily.   ibuprofen (ADVIL) 600 MG tablet Take 1 tablet (600 mg total) by mouth  3 (three) times daily. (Patient not taking: Reported on 09/11/2023)   indomethacin (INDOCIN) 50 MG capsule Take 1 capsule (50 mg total) by mouth 2 (two) times daily with a meal.   neomycin-polymyxin-hydrocortisone (CORTISPORIN) 3.5-10000-1 OTIC suspension Place 4 drops into the right ear 3 (three) times daily. (Patient not taking: Reported on 09/11/2023)   ondansetron (ZOFRAN-ODT) 4 MG disintegrating tablet Take 1 tablet (4 mg total) by mouth every 8 (eight) hours as needed for nausea or vomiting. (Patient not taking:  Reported on 09/11/2023)   tiZANidine (ZANAFLEX) 4 MG tablet Take 1 tablet (4 mg total) by mouth every 6 (six) hours as needed for muscle spasms.   [DISCONTINUED] cetirizine (ZYRTEC) 10 MG tablet Take 1 tablet (10 mg total) by mouth daily. (Patient not taking: Reported on 04/09/2019)   [DISCONTINUED] indomethacin (INDOCIN) 50 MG capsule Take 1 capsule (50 mg total) by mouth 2 (two) times daily with a meal. (Patient not taking: Reported on 09/11/2023)   No facility-administered encounter medications on file as of 09/11/2023.    Review of Systems  Review of Systems  Constitutional: Negative.   HENT: Negative.    Cardiovascular: Negative.   Gastrointestinal: Negative.   Allergic/Immunologic: Negative.   Neurological: Negative.   Psychiatric/Behavioral: Negative.       Objective:   BP 134/83   Pulse 85   Temp (!) 97.4 F (36.3 C)   Wt 194 lb (88 kg)   SpO2 100%   BMI 37.89 kg/m   Wt Readings from Last 5 Encounters:  09/11/23 194 lb (88 kg)  04/04/23 195 lb (88.5 kg)  03/11/23 195 lb 9.6 oz (88.7 kg)  09/10/22 192 lb 3.2 oz (87.2 kg)  03/09/22 186 lb 6.4 oz (84.6 kg)     Physical Exam Vitals and nursing note reviewed.  Constitutional:      General: She is not in acute distress.    Appearance: She is well-developed.  Cardiovascular:     Rate and Rhythm: Normal rate and regular rhythm.  Pulmonary:     Effort: Pulmonary effort is normal.     Breath sounds: Normal breath sounds.  Neurological:     Mental Status: She is alert and oriented to person, place, and time.       Assessment & Plan:   Thyroid disorder screen -     TSH  Essential hypertension -     CBC -     Comprehensive metabolic panel -     amLODIPine Besylate; Take 1 tablet (10 mg total) by mouth daily.  Dispense: 90 tablet; Refill: 3 -     hydroCHLOROthiazide; Take 2 capsules (25 mg total) by mouth daily.  Dispense: 180 capsule; Refill: 0  Lipid screening -     Lipid panel  Other orders -      Indomethacin; Take 1 capsule (50 mg total) by mouth 2 (two) times daily with a meal.  Dispense: 30 capsule; Refill: 3 -     tiZANidine HCl; Take 1 tablet (4 mg total) by mouth every 6 (six) hours as needed for muscle spasms.  Dispense: 30 tablet; Refill: 0 -     Allopurinol; Take 1 tablet (100 mg total) by mouth daily.  Dispense: 30 tablet; Refill: 2     Return in about 6 months (around 03/10/2024).     Ivonne Andrew, NP 09/11/2023

## 2023-09-12 ENCOUNTER — Other Ambulatory Visit: Payer: Self-pay

## 2023-09-12 LAB — CBC
Hematocrit: 38.2 % (ref 34.0–46.6)
Hemoglobin: 12.9 g/dL (ref 11.1–15.9)
MCH: 31 pg (ref 26.6–33.0)
MCHC: 33.8 g/dL (ref 31.5–35.7)
MCV: 92 fL (ref 79–97)
Platelets: 289 10*3/uL (ref 150–450)
RBC: 4.16 x10E6/uL (ref 3.77–5.28)
RDW: 12.7 % (ref 11.7–15.4)
WBC: 3.7 10*3/uL (ref 3.4–10.8)

## 2023-09-12 LAB — COMPREHENSIVE METABOLIC PANEL
ALT: 12 [IU]/L (ref 0–32)
AST: 21 [IU]/L (ref 0–40)
Albumin: 4.7 g/dL (ref 3.9–4.9)
Alkaline Phosphatase: 86 [IU]/L (ref 44–121)
BUN/Creatinine Ratio: 17 (ref 12–28)
BUN: 12 mg/dL (ref 8–27)
Bilirubin Total: 0.3 mg/dL (ref 0.0–1.2)
CO2: 26 mmol/L (ref 20–29)
Calcium: 9.9 mg/dL (ref 8.7–10.3)
Chloride: 102 mmol/L (ref 96–106)
Creatinine, Ser: 0.71 mg/dL (ref 0.57–1.00)
Globulin, Total: 2.9 g/dL (ref 1.5–4.5)
Glucose: 96 mg/dL (ref 70–99)
Potassium: 4 mmol/L (ref 3.5–5.2)
Sodium: 143 mmol/L (ref 134–144)
Total Protein: 7.6 g/dL (ref 6.0–8.5)
eGFR: 94 mL/min/{1.73_m2} (ref >59)

## 2023-09-12 LAB — LIPID PANEL
Chol/HDL Ratio: 3.4 {ratio} (ref 0.0–4.4)
Cholesterol, Total: 188 mg/dL (ref 100–199)
HDL: 56 mg/dL (ref >39)
LDL Chol Calc (NIH): 108 mg/dL — ABNORMAL HIGH (ref 0–99)
Triglycerides: 135 mg/dL (ref 0–149)
VLDL Cholesterol Cal: 24 mg/dL (ref 5–40)

## 2023-09-12 LAB — TSH: TSH: 1.27 u[IU]/mL (ref 0.450–4.500)

## 2023-11-15 ENCOUNTER — Ambulatory Visit
Admission: RE | Admit: 2023-11-15 | Discharge: 2023-11-15 | Disposition: A | Discharge status: Home / Self Care | Payer: Medicare HMO | Source: Ambulatory Visit | Attending: Nurse Practitioner | Admitting: Nurse Practitioner

## 2023-11-15 DIAGNOSIS — N958 Other specified menopausal and perimenopausal disorders: Secondary | ICD-10-CM | POA: Diagnosis not present

## 2023-11-15 DIAGNOSIS — Z780 Asymptomatic menopausal state: Secondary | ICD-10-CM

## 2023-11-15 DIAGNOSIS — E2839 Other primary ovarian failure: Secondary | ICD-10-CM | POA: Diagnosis not present

## 2023-11-15 DIAGNOSIS — M8588 Other specified disorders of bone density and structure, other site: Secondary | ICD-10-CM | POA: Diagnosis not present

## 2023-11-15 DIAGNOSIS — Z0000 Encounter for general adult medical examination without abnormal findings: Secondary | ICD-10-CM

## 2023-11-21 ENCOUNTER — Other Ambulatory Visit: Payer: Self-pay

## 2023-11-21 ENCOUNTER — Other Ambulatory Visit: Payer: Self-pay | Admitting: Nurse Practitioner

## 2023-11-21 MED ORDER — TIZANIDINE HCL 4 MG PO TABS
4.0000 mg | ORAL_TABLET | Freq: Four times a day (QID) | ORAL | 0 refills | Status: DC | PRN
  Filled 2023-11-21: qty 30, 8d supply, fill #0

## 2023-11-21 NOTE — Telephone Encounter (Signed)
 Please advise [facility]

## 2023-11-22 ENCOUNTER — Other Ambulatory Visit: Payer: Self-pay

## 2023-11-22 ENCOUNTER — Other Ambulatory Visit: Payer: Self-pay | Admitting: Nurse Practitioner

## 2023-11-22 DIAGNOSIS — M810 Age-related osteoporosis without current pathological fracture: Secondary | ICD-10-CM

## 2023-11-22 MED ORDER — ALENDRONATE SODIUM 70 MG PO TABS
70.0000 mg | ORAL_TABLET | ORAL | 11 refills | Status: DC
  Filled 2023-11-22: qty 4, 28d supply, fill #0

## 2023-11-27 ENCOUNTER — Emergency Department (HOSPITAL_COMMUNITY)

## 2023-11-27 ENCOUNTER — Encounter (HOSPITAL_COMMUNITY): Payer: Self-pay | Admitting: Internal Medicine

## 2023-11-27 ENCOUNTER — Observation Stay (HOSPITAL_COMMUNITY)
Admission: EM | Admit: 2023-11-27 | Discharge: 2023-11-29 | Disposition: A | Discharge status: Home / Self Care | Attending: Internal Medicine | Admitting: Internal Medicine

## 2023-11-27 ENCOUNTER — Other Ambulatory Visit: Payer: Self-pay

## 2023-11-27 DIAGNOSIS — M542 Cervicalgia: Secondary | ICD-10-CM | POA: Diagnosis not present

## 2023-11-27 DIAGNOSIS — Z79899 Other long term (current) drug therapy: Secondary | ICD-10-CM | POA: Insufficient documentation

## 2023-11-27 DIAGNOSIS — I309 Acute pericarditis, unspecified: Principal | ICD-10-CM | POA: Insufficient documentation

## 2023-11-27 DIAGNOSIS — Z6837 Body mass index (BMI) 37.0-37.9, adult: Secondary | ICD-10-CM | POA: Diagnosis not present

## 2023-11-27 DIAGNOSIS — R42 Dizziness and giddiness: Secondary | ICD-10-CM | POA: Diagnosis not present

## 2023-11-27 DIAGNOSIS — R0989 Other specified symptoms and signs involving the circulatory and respiratory systems: Secondary | ICD-10-CM | POA: Diagnosis not present

## 2023-11-27 DIAGNOSIS — R000 Tachycardia, unspecified: Secondary | ICD-10-CM | POA: Diagnosis not present

## 2023-11-27 DIAGNOSIS — I10 Essential (primary) hypertension: Secondary | ICD-10-CM | POA: Diagnosis not present

## 2023-11-27 DIAGNOSIS — R0789 Other chest pain: Secondary | ICD-10-CM | POA: Diagnosis not present

## 2023-11-27 DIAGNOSIS — J849 Interstitial pulmonary disease, unspecified: Secondary | ICD-10-CM | POA: Diagnosis not present

## 2023-11-27 DIAGNOSIS — I952 Hypotension due to drugs: Secondary | ICD-10-CM

## 2023-11-27 DIAGNOSIS — F1290 Cannabis use, unspecified, uncomplicated: Secondary | ICD-10-CM | POA: Diagnosis not present

## 2023-11-27 DIAGNOSIS — E782 Mixed hyperlipidemia: Secondary | ICD-10-CM | POA: Diagnosis present

## 2023-11-27 DIAGNOSIS — R700 Elevated erythrocyte sedimentation rate: Secondary | ICD-10-CM

## 2023-11-27 DIAGNOSIS — J9811 Atelectasis: Secondary | ICD-10-CM | POA: Diagnosis not present

## 2023-11-27 DIAGNOSIS — E785 Hyperlipidemia, unspecified: Secondary | ICD-10-CM

## 2023-11-27 DIAGNOSIS — R0781 Pleurodynia: Secondary | ICD-10-CM | POA: Diagnosis present

## 2023-11-27 DIAGNOSIS — R918 Other nonspecific abnormal finding of lung field: Secondary | ICD-10-CM | POA: Insufficient documentation

## 2023-11-27 DIAGNOSIS — K760 Fatty (change of) liver, not elsewhere classified: Secondary | ICD-10-CM | POA: Insufficient documentation

## 2023-11-27 DIAGNOSIS — E66812 Other obesity due to excess calories: Secondary | ICD-10-CM

## 2023-11-27 DIAGNOSIS — R079 Chest pain, unspecified: Principal | ICD-10-CM

## 2023-11-27 DIAGNOSIS — I319 Disease of pericardium, unspecified: Secondary | ICD-10-CM | POA: Diagnosis present

## 2023-11-27 DIAGNOSIS — Z87891 Personal history of nicotine dependence: Secondary | ICD-10-CM | POA: Diagnosis not present

## 2023-11-27 DIAGNOSIS — E6609 Other obesity due to excess calories: Secondary | ICD-10-CM

## 2023-11-27 DIAGNOSIS — M549 Dorsalgia, unspecified: Secondary | ICD-10-CM | POA: Diagnosis present

## 2023-11-27 DIAGNOSIS — R7982 Elevated C-reactive protein (CRP): Secondary | ICD-10-CM | POA: Diagnosis not present

## 2023-11-27 DIAGNOSIS — R932 Abnormal findings on diagnostic imaging of liver and biliary tract: Secondary | ICD-10-CM | POA: Insufficient documentation

## 2023-11-27 DIAGNOSIS — Q600 Renal agenesis, unilateral: Secondary | ICD-10-CM | POA: Diagnosis not present

## 2023-11-27 LAB — CBC
HCT: 40.4 % (ref 36.0–46.0)
Hemoglobin: 13.3 g/dL (ref 12.0–15.0)
MCH: 29.7 pg (ref 26.0–34.0)
MCHC: 32.9 g/dL (ref 30.0–36.0)
MCV: 90.2 fL (ref 80.0–100.0)
Platelets: 301 10*3/uL (ref 150–400)
RBC: 4.48 MIL/uL (ref 3.87–5.11)
RDW: 13 % (ref 11.5–15.5)
WBC: 7.2 10*3/uL (ref 4.0–10.5)
nRBC: 0 % (ref 0.0–0.2)

## 2023-11-27 LAB — RAPID URINE DRUG SCREEN, HOSP PERFORMED
Amphetamines: NOT DETECTED
Barbiturates: NOT DETECTED
Benzodiazepines: NOT DETECTED
Cocaine: NOT DETECTED
Opiates: POSITIVE — AB
Tetrahydrocannabinol: POSITIVE — AB

## 2023-11-27 LAB — BASIC METABOLIC PANEL WITH GFR
Anion gap: 14 (ref 5–15)
BUN: 17 mg/dL (ref 8–23)
CO2: 21 mmol/L — ABNORMAL LOW (ref 22–32)
Calcium: 10 mg/dL (ref 8.9–10.3)
Chloride: 97 mmol/L — ABNORMAL LOW (ref 98–111)
Creatinine, Ser: 0.66 mg/dL (ref 0.44–1.00)
GFR, Estimated: >60 mL/min (ref >60)
Glucose, Bld: 126 mg/dL — ABNORMAL HIGH (ref 70–99)
Potassium: 3.4 mmol/L — ABNORMAL LOW (ref 3.5–5.1)
Sodium: 132 mmol/L — ABNORMAL LOW (ref 135–145)

## 2023-11-27 LAB — HEMOGLOBIN A1C
Hgb A1c MFr Bld: 5.8 % — ABNORMAL HIGH (ref 4.8–5.6)
Mean Plasma Glucose: 119.76 mg/dL

## 2023-11-27 LAB — TROPONIN I (HIGH SENSITIVITY)
Troponin I (High Sensitivity): 3 ng/L (ref <18)
Troponin I (High Sensitivity): <2 ng/L (ref <18)

## 2023-11-27 LAB — HIV ANTIBODY (ROUTINE TESTING W REFLEX): HIV Screen 4th Generation wRfx: NONREACTIVE

## 2023-11-27 MED ORDER — SODIUM CHLORIDE 0.9 % IV BOLUS
250.0000 mL | Freq: Once | INTRAVENOUS | Status: AC
  Administered 2023-11-27: 250 mL via INTRAVENOUS

## 2023-11-27 MED ORDER — KETOROLAC TROMETHAMINE 15 MG/ML IJ SOLN: 7.5000 mg | Freq: Once | INTRAMUSCULAR | Status: DC

## 2023-11-27 MED ORDER — HYDROMORPHONE HCL 1 MG/ML IJ SOLN
1.0000 mg | Freq: Once | INTRAMUSCULAR | Status: AC
  Administered 2023-11-27: 1 mg via INTRAVENOUS
  Filled 2023-11-27: qty 1

## 2023-11-27 MED ORDER — NAPROXEN 250 MG PO TABS
250.0000 mg | ORAL_TABLET | Freq: Once | ORAL | Status: DC
  Filled 2023-11-27: qty 1

## 2023-11-27 MED ORDER — ASPIRIN 81 MG PO TBEC
81.0000 mg | DELAYED_RELEASE_TABLET | Freq: Every day | ORAL | Status: DC
  Administered 2023-11-27 – 2023-11-29 (×3): 81 mg via ORAL
  Filled 2023-11-27 (×3): qty 1

## 2023-11-27 MED ORDER — ACETAMINOPHEN 325 MG PO TABS
650.0000 mg | ORAL_TABLET | ORAL | Status: DC | PRN
  Administered 2023-11-27 (×2): 650 mg via ORAL
  Filled 2023-11-27 (×2): qty 2

## 2023-11-27 MED ORDER — PANTOPRAZOLE SODIUM 40 MG PO TBEC
40.0000 mg | DELAYED_RELEASE_TABLET | Freq: Every day | ORAL | Status: DC
Start: 2023-11-27 — End: 2023-11-29
  Administered 2023-11-27 – 2023-11-29 (×3): 40 mg via ORAL
  Filled 2023-11-27 (×3): qty 1

## 2023-11-27 MED ORDER — ALLOPURINOL 100 MG PO TABS
100.0000 mg | ORAL_TABLET | Freq: Every day | ORAL | Status: DC
Start: 2023-11-27 — End: 2023-11-29
  Administered 2023-11-27 – 2023-11-29 (×3): 100 mg via ORAL
  Filled 2023-11-27 (×3): qty 1

## 2023-11-27 MED ORDER — NAPROXEN 250 MG PO TABS: 250.0000 mg | ORAL_TABLET | Freq: Once | ORAL | Status: DC

## 2023-11-27 MED ORDER — ONDANSETRON HCL 4 MG/2ML IJ SOLN
4.0000 mg | Freq: Four times a day (QID) | INTRAMUSCULAR | Status: DC | PRN
  Administered 2023-11-27: 4 mg via INTRAVENOUS
  Filled 2023-11-27: qty 2

## 2023-11-27 MED ORDER — TIZANIDINE HCL 4 MG PO TABS
4.0000 mg | ORAL_TABLET | Freq: Four times a day (QID) | ORAL | Status: DC | PRN
  Administered 2023-11-27 – 2023-11-28 (×3): 4 mg via ORAL
  Filled 2023-11-27 (×3): qty 1

## 2023-11-27 MED ORDER — HYDROCHLOROTHIAZIDE 12.5 MG PO TABS
25.0000 mg | ORAL_TABLET | Freq: Every day | ORAL | Status: DC
  Administered 2023-11-27 – 2023-11-28 (×2): 25 mg via ORAL
  Filled 2023-11-27 (×2): qty 2

## 2023-11-27 MED ORDER — HYDRALAZINE HCL 20 MG/ML IJ SOLN: 5.0000 mg | INTRAMUSCULAR | Status: DC | PRN

## 2023-11-27 MED ORDER — IOHEXOL 350 MG/ML SOLN
100.0000 mL | Freq: Once | INTRAVENOUS | Status: AC | PRN
  Administered 2023-11-27: 100 mL via INTRAVENOUS

## 2023-11-27 MED ORDER — ONDANSETRON HCL 4 MG/2ML IJ SOLN
4.0000 mg | Freq: Once | INTRAMUSCULAR | Status: AC
  Administered 2023-11-27: 4 mg via INTRAVENOUS
  Filled 2023-11-27: qty 2

## 2023-11-27 MED ORDER — AMLODIPINE BESYLATE 10 MG PO TABS
10.0000 mg | ORAL_TABLET | Freq: Every day | ORAL | Status: DC
  Administered 2023-11-27 – 2023-11-29 (×3): 10 mg via ORAL
  Filled 2023-11-27: qty 2
  Filled 2023-11-27 (×2): qty 1

## 2023-11-27 MED ORDER — ENOXAPARIN SODIUM 40 MG/0.4ML IJ SOSY
40.0000 mg | PREFILLED_SYRINGE | INTRAMUSCULAR | Status: DC
  Administered 2023-11-27 – 2023-11-28 (×2): 40 mg via SUBCUTANEOUS
  Filled 2023-11-27 (×2): qty 0.4

## 2023-11-27 MED ORDER — KETOROLAC TROMETHAMINE 15 MG/ML IJ SOLN
15.0000 mg | Freq: Four times a day (QID) | INTRAMUSCULAR | Status: DC | PRN
  Administered 2023-11-27 – 2023-11-28 (×2): 15 mg via INTRAVENOUS
  Filled 2023-11-27 (×2): qty 1

## 2023-11-27 MED ORDER — ROSUVASTATIN CALCIUM 5 MG PO TABS
5.0000 mg | ORAL_TABLET | Freq: Every day | ORAL | Status: DC
  Administered 2023-11-27 – 2023-11-28 (×2): 5 mg via ORAL
  Filled 2023-11-27 (×2): qty 1
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
## 2023-11-27 NOTE — ED Triage Notes (Signed)
 Pt. Stated, Ive had chest pain, back pain and neck pain started around 10 last night. . Denies any other symptoms. Pt is grunting, moaning in triage.

## 2023-11-27 NOTE — Progress Notes (Signed)
 Unable to call CCMD to put patient on monitor due to network error. Phones not working at this time. Unit monitoring until phones work again.

## 2023-11-27 NOTE — ED Notes (Signed)
 Attempted to ambulate the pt. Pt sat on the side of the bed stated she was dizzy weak and nauseous. Pt stated she was unable to walk right now

## 2023-11-27 NOTE — ED Notes (Addendum)
 Nt called CCMD to put pt on monitor

## 2023-11-27 NOTE — ED Notes (Signed)
 CT does not have a scanner open, Ct will call when scanner is available

## 2023-11-27 NOTE — ED Provider Notes (Signed)
 [facility] Provider Note   CSN: [id number] Arrival date & time: 11/27/23  4098     History  Chief Complaint  Patient presents with   Chest Pain   Back Pain   Neck Pain    Kaylee [name] is a 68 y.o. female.  The history is provided by the patient, a relative and medical records. No language interpreter was used.  Chest Pain Associated symptoms: back pain   Back Pain Associated symptoms: chest pain   Neck Pain Associated symptoms: chest pain      68 year old female history of hypertension hyperlipidemia presenting with complaint of chest pain.  History obtained through patient and through family member at bedside.  Patient report acute onset of pain across her chest that started around 10 PM last night.  Pain is sharp stabbing radiates to her back and towards her neck.  Pain is persistent nothing seems to make it better or worse.  No report of lightheadedness and dizziness no nausea no shortness of breath no abdominal pain no numbness or weakness.  She has never had this pain before.  No precipitating factor.  History is limited as patient is in moderate distress.  Home Medications Prior to Admission medications   Medication Sig Start Date End Date Taking? Authorizing Provider  acetaminophen (TYLENOL) 325 MG tablet Take 650 mg by mouth every 6 (six) hours as needed for mild pain or headache. Patient not taking: Reported on 09/11/2023    [provider]  alendronate (FOSAMAX) 70 MG tablet Take 1 tablet (70 mg total) by mouth every 7 (seven) days. Take with a full glass of water on an empty stomach. 11/22/23   Ivonne Andrew, NP  allopurinol (ZYLOPRIM) 100 MG tablet Take 1 tablet (100 mg total) by mouth daily. 09/11/23   Ivonne Andrew, NP  amLODipine (NORVASC) 10 MG tablet Take 1 tablet (10 mg total) by mouth daily. 09/11/23   Ivonne Andrew, NP  benzonatate (TESSALON) 100 MG capsule Take 1 capsule (100 mg total) by mouth  every 8 (eight) hours as needed for cough. Patient not taking: Reported on 09/11/2023 06/09/23   Gustavus Bryant, FNP  fluticasone [facility]) 50 MCG/ACT nasal spray Place 2 sprays into both nostrils daily. Patient not taking: Reported on 09/11/2023 10/22/22   Ellsworth Lennox, PA-C  hydrochlorothiazide (MICROZIDE) 12.5 MG capsule Take 2 capsules (25 mg total) by mouth daily. 09/11/23   Ivonne Andrew, NP  ibuprofen (ADVIL) 600 MG tablet Take 1 tablet (600 mg total) by mouth 3 (three) times daily. Patient not taking: Reported on 09/11/2023 03/15/22   Ellsworth Lennox, PA-C  indomethacin (INDOCIN) 50 MG capsule Take 1 capsule (50 mg total) by mouth 2 (two) times daily with a meal. 09/11/23   Ivonne Andrew, NP  neomycin-polymyxin-hydrocortisone (CORTISPORIN) 3.5-10000-1 OTIC suspension Place 4 drops into the right ear 3 (three) times daily. Patient not taking: Reported on 09/11/2023 10/22/22   Ellsworth Lennox, PA-C  ondansetron (ZOFRAN-ODT) 4 MG disintegrating tablet Take 1 tablet (4 mg total) by mouth every 8 (eight) hours as needed for nausea or vomiting. Patient not taking: Reported on 09/11/2023 06/09/23   Gustavus Bryant, FNP  rosuvastatin (CRESTOR) 10 MG tablet Take 1 tablet (10 mg total) by mouth daily. 03/13/23 03/12/24  Ivonne Andrew, NP  tiZANidine (ZANAFLEX) 4 MG tablet Take 1 tablet (4 mg total) by mouth every 6 (six) hours as needed for muscle spasms. 11/21/23   Angus Seller  S, NP  cetirizine (ZYRTEC) 10 MG tablet Take 1 tablet (10 mg total) by mouth daily. Patient not taking: Reported on 04/09/2019 05/30/18 04/09/19  Mike Gip, FNP      Allergies    Codeine, Gabapentin (once-daily), and Tomato    Review of Systems   Review of Systems  Cardiovascular:  Positive for chest pain.  Musculoskeletal:  Positive for back pain and neck pain.  All other systems reviewed and are negative.   Physical Exam Updated Vital Signs BP (!) 157/122 (BP Location: Left Arm)   Pulse (!) 115   Temp 98.5 F (36.9 C)    Resp 18   Ht 5' (1.524 m)   Wt 86.2 kg   SpO2 97%   BMI 37.11 kg/m  Physical Exam Vitals and nursing note reviewed.  Constitutional:      General: She is in acute distress.     Appearance: She is well-developed.     Comments: Patient is laying in bed moaning and grunting appears uncomfortable.  HENT:     Head: Atraumatic.  Eyes:     Conjunctiva/sclera: Conjunctivae normal.  Cardiovascular:     Rate and Rhythm: Tachycardia present.     Pulses: Normal pulses.     Heart sounds: Normal heart sounds.  Pulmonary:     Effort: Pulmonary effort is normal.     Breath sounds: No wheezing, rhonchi or rales.  Abdominal:     Palpations: Abdomen is soft.     Comments: No abdominal bruit or pulsatile mass.  Abdomen soft nontender.  Musculoskeletal:     Cervical back: Neck supple.  Skin:    Capillary Refill: Capillary refill takes less than 2 seconds. Intact distal pulses all 4 extremities with equal strength    Findings: No rash.  Neurological:     Mental Status: She is alert.     Comments: Right-sided facial droop from prior Bell's palsy  Psychiatric:        Mood and Affect: Mood normal.     ED Results / Procedures / Treatments   Labs (all labs ordered are listed, but only abnormal results are displayed) Labs Reviewed  BASIC METABOLIC PANEL WITH GFR - Abnormal; Notable for the following components:      Result Value   Sodium 132 (*)    Potassium 3.4 (*)    Chloride 97 (*)    CO2 21 (*)    Glucose, Bld 126 (*)    All other components within normal limits  CBC  TROPONIN I (HIGH SENSITIVITY)  TROPONIN I (HIGH SENSITIVITY)    EKG EKG Interpretation Date/Time:  Wednesday November 27 2023 08:22:15 EDT Ventricular Rate:  107 PR Interval:  150 QRS Duration:  94 QT Interval:  344 QTC Calculation: 459 R Axis:   35  Text Interpretation: Sinus tachycardia T wave abnormality, consider inferior ischemia Abnormal ECG  HR is faster when compared to 2020 Confirmed by Pricilla Loveless  [phone]) on 11/27/2023 9:18:10 AM  Radiology CT Angio Chest/Abd/Pel for Dissection W and/or Wo Contrast Result Date: 11/27/2023 CLINICAL DATA:  Chest, neck, and back pain, acute aortic syndrome suspected EXAM: CT ANGIOGRAPHY CHEST, ABDOMEN AND PELVIS TECHNIQUE: Non-contrast CT of the chest was initially obtained. Multidetector CT imaging through the chest, abdomen and pelvis was performed using the standard protocol during bolus administration of intravenous contrast. Multiplanar reconstructed images and MIPs were obtained and reviewed to evaluate the vascular anatomy. RADIATION DOSE REDUCTION: This exam was performed according to the departmental dose-optimization program which includes automated  exposure control, adjustment of the mA and/or kV according to patient size and/or use of iterative reconstruction technique. CONTRAST:  OMNIPAQUE IOHEXOL 350 MG/ML SOLN COMPARISON:  None Available. FINDINGS: CTA CHEST FINDINGS VASCULAR Aorta: Satisfactory opacification of the aorta. Normal contour and caliber of the thoracic aorta. No evidence of aneurysm, dissection, or other acute aortic pathology. Cardiovascular: No evidence of pulmonary embolism on limited non-tailored examination. Mild cardiomegaly. No pericardial effusion. Review of the MIP images confirms the above findings. NON VASCULAR Mediastinum/Nodes: No enlarged mediastinal, hilar, or axillary lymph nodes. Thyroid gland, trachea, and esophagus demonstrate no significant findings. Lungs/Pleura: Very extensive bibasilar scarring, atelectasis, and/or subacute airspace disease (series 7, image 93). No pleural effusion or pneumothorax. Musculoskeletal: No chest wall abnormality. No acute osseous findings. Review of the MIP images confirms the above findings. CTA ABDOMEN AND PELVIS FINDINGS VASCULAR Normal contour and caliber of the abdominal aorta. No evidence of aneurysm, dissection, or other acute aortic pathology. Standard branching pattern of the  abdominal aorta with solitary bilateral renal arteries. Review of the MIP images confirms the above findings. NON-VASCULAR Hepatobiliary: No solid liver abnormality is seen. Hepatic steatosis. No gallstones, gallbladder wall thickening, or biliary dilatation. Pancreas: Unremarkable. No pancreatic ductal dilatation or surrounding inflammatory changes. Spleen: Normal in size without significant abnormality. Adrenals/Urinary Tract: Adrenal glands are unremarkable. Kidneys are normal, without renal calculi, solid lesion, or hydronephrosis. Bladder is unremarkable. Stomach/Bowel: Stomach is within normal limits. Appendix appears normal. No evidence of bowel wall thickening, distention, or inflammatory changes. Lymphatic: No enlarged abdominal or pelvic lymph nodes. Reproductive: No mass or other significant abnormality. Other: No abdominal wall hernia or abnormality. No ascites. Musculoskeletal: No acute osseous findings. IMPRESSION: 1. Normal contour and caliber of the thoracic and abdominal aorta. No evidence of aneurysm, dissection, or other acute aortic pathology. No significant atherosclerosis. 2. No findings to explain pain. 3. Very extensive bibasilar scarring, atelectasis, and/or subacute airspace disease. Underlying pulmonary fibrosis is distinctly not excluded; further assessment via pulmonary referral and dedicated ILD protocol CT on a nonemergent, outpatient basis at resolution of acute clinical presentation. 4. Mild cardiomegaly. 5. Hepatic steatosis. Electronically Signed   By: Jearld Lesch M.D.   On: 11/27/2023 11:43   DG Chest 2 View Result Date: 11/27/2023 CLINICAL DATA:  Chest pain EXAM: CHEST - 2 VIEW COMPARISON:  06/09/2023 FINDINGS: Heart and mediastinal contours within normal limits. Low lung volumes with left base atelectasis. Right lung clear. No effusions. No acute bony abnormality. IMPRESSION: Low lung volumes with left base atelectasis. Electronically Signed   By: Charlett Nose M.D.   On:  11/27/2023 10:28    Procedures Procedures    Medications Ordered in ED Medications  ondansetron (ZOFRAN) injection 4 mg (4 mg Intravenous Given 11/27/23 1045)  HYDROmorphone (DILAUDID) injection 1 mg (1 mg Intravenous Given 11/27/23 1046)  iohexol (OMNIPAQUE) 350 MG/ML injection 100 mL (100 mLs Intravenous Contrast Given 11/27/23 1133)  HYDROmorphone (DILAUDID) injection 1 mg (1 mg Intravenous Given 11/27/23 1217)  ondansetron (ZOFRAN) injection 4 mg (4 mg Intravenous Given 11/27/23 1259)    ED Course/ Medical Decision Making/ A&P                                 Medical Decision Making Amount and/or Complexity of Data Reviewed Labs: ordered. Radiology: ordered.  Risk Prescription drug management.   BP (!) 157/122 (BP Location: Left Arm)   Pulse (!) 115   Temp 98.5 F (  36.9 C)   Resp 18   Ht 5' (1.524 m)   Wt 86.2 kg   SpO2 97%   BMI 37.11 kg/m   64:31 AM  68 year old female history of hypertension hyperlipidemia presenting with complaint of chest pain.  History obtained through patient and through family member at bedside.  Patient report acute onset of pain across her chest that started around 10 PM last night.  Pain is sharp stabbing radiates to her back and towards her neck.  Pain is persistent nothing seems to make it better or worse.  No report of lightheadedness and dizziness no nausea no shortness of breath no abdominal pain no numbness or weakness.  She has never had this pain before.  No precipitating factor.  History is limited as patient is in moderate distress.  On exam, patient is moaning and grunting appears uncomfortable.  Her lungs are clear abdomen is soft nontender she has equal pulses to all 4 extremities with equal sensation.  She does have a right-sided facial droop from a prior Bell's palsy.  She does not have any pulsatile mass about her neck.  Vitals are notable for elevated blood pressure 157/122 and an elevated heart rate of 115.  Given her discomfort, I  will order a dissection study to rule out aortic dissection.  Opiate pain medication and antinausea medication given.  Workup initiated.  -Labs ordered, independently viewed and interpreted by me.  Labs remarkable for negative delta trop.  Electrolytes are reassuring -The patient was maintained on a cardiac monitor.  I personally viewed and interpreted the cardiac monitored which showed an underlying rhythm of: NSR -Imaging independently viewed and interpreted by me and I agree with radiologist's interpretation.  Result remarkable for CTA chest/abd/pelvis without acute finding -This patient presents to the ED for concern of chest pain, this involves an extensive number of treatment options, and is a complaint that carries with it a high risk of complications and morbidity.  The differential diagnosis includes dissection, pe, acs, gastritis, gerd, costochondritis, pna -Co morbidities that complicate the patient evaluation includes HTN, HLD -Treatment includes dilaudid, zofran -Reevaluation of the patient after these medicines showed that the patient improved -PCP office notes or outside notes reviewed -Discussion with attending DR. Jeraldine Loots -Escalation to admission/observation considered: appreciate consultation with [facility] Dr. Ophelia Charter who agrees to admit pt for c/p rule out         Final Clinical Impression(s) / ED Diagnoses Final diagnoses:  Chest pain, unspecified type    Rx / DC Orders ED Discharge Orders     None         Fayrene Helper, PA-C 11/27/23 1440    Gerhard Munch, MD 11/28/23 2035
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
## 2023-11-27 NOTE — ED Provider Triage Note (Signed)
 Emergency Medicine Provider Triage Evaluation Note  Kaylee [name] , a 68 y.o. female  was evaluated in triage.  Pt complains of chest pain since last evening.  Feels like a spasm in her chest.  She also has pain in her back and neck though does not feel like the chest pain is going from her chest to her back.  Review of Systems  Positive: Chest pain, back pain, neck pain, bilateral shoulder pain Negative: Vomiting or abdominal pain  Physical Exam  BP (!) 157/122 (BP Location: Left Arm)   Pulse (!) 115   Temp 98.5 F (36.9 C)   Resp 18   Ht 5' (1.524 m)   Wt 86.2 kg   SpO2 97%   BMI 37.11 kg/m  Gen:   Awake, grunting in pain Resp:  Grunting, clear lungs otherwise Card:  Tachycardic  Medical Decision Making  Medically screening exam initiated at 9:33 AM.  Appropriate orders placed.  Kaylee [name] was informed that the remainder of the evaluation will be completed by another provider, this initial triage assessment does not replace that evaluation, and the importance of remaining in the ED until their evaluation is complete.  Patient is grunting in pain.  She is tachycardic and hypertensive.  I have discussed with charge nurse that she get a room ASAP.   Pricilla Loveless, MD 11/27/23 [phone]

## 2023-11-28 ENCOUNTER — Observation Stay (HOSPITAL_BASED_OUTPATIENT_CLINIC_OR_DEPARTMENT_OTHER)

## 2023-11-28 DIAGNOSIS — R7982 Elevated C-reactive protein (CRP): Secondary | ICD-10-CM

## 2023-11-28 DIAGNOSIS — I300 Acute nonspecific idiopathic pericarditis: Secondary | ICD-10-CM | POA: Diagnosis not present

## 2023-11-28 DIAGNOSIS — R0781 Pleurodynia: Secondary | ICD-10-CM | POA: Diagnosis not present

## 2023-11-28 DIAGNOSIS — E785 Hyperlipidemia, unspecified: Secondary | ICD-10-CM | POA: Diagnosis not present

## 2023-11-28 DIAGNOSIS — R700 Elevated erythrocyte sedimentation rate: Secondary | ICD-10-CM | POA: Diagnosis not present

## 2023-11-28 DIAGNOSIS — I10 Essential (primary) hypertension: Secondary | ICD-10-CM | POA: Diagnosis not present

## 2023-11-28 DIAGNOSIS — I952 Hypotension due to drugs: Secondary | ICD-10-CM

## 2023-11-28 DIAGNOSIS — E782 Mixed hyperlipidemia: Secondary | ICD-10-CM | POA: Diagnosis not present

## 2023-11-28 DIAGNOSIS — R079 Chest pain, unspecified: Secondary | ICD-10-CM

## 2023-11-28 DIAGNOSIS — E66812 Obesity, class 2: Secondary | ICD-10-CM | POA: Diagnosis not present

## 2023-11-28 LAB — COMPREHENSIVE METABOLIC PANEL WITH GFR
ALT: 13 U/L (ref 0–44)
AST: 18 U/L (ref 15–41)
Albumin: 3.6 g/dL (ref 3.5–5.0)
Alkaline Phosphatase: 56 U/L (ref 38–126)
Anion gap: 12 (ref 5–15)
BUN: 13 mg/dL (ref 8–23)
CO2: 23 mmol/L (ref 22–32)
Calcium: 9.1 mg/dL (ref 8.9–10.3)
Chloride: 100 mmol/L (ref 98–111)
Creatinine, Ser: 0.72 mg/dL (ref 0.44–1.00)
GFR, Estimated: >60 mL/min (ref >60)
Glucose, Bld: 101 mg/dL — ABNORMAL HIGH (ref 70–99)
Potassium: 2.9 mmol/L — ABNORMAL LOW (ref 3.5–5.1)
Sodium: 135 mmol/L (ref 135–145)
Total Bilirubin: 0.8 mg/dL (ref 0.0–1.2)
Total Protein: 7.6 g/dL (ref 6.5–8.1)

## 2023-11-28 LAB — HEPATITIS PANEL, ACUTE
HCV Ab: NONREACTIVE
Hep A IgM: NONREACTIVE
Hep B C IgM: NONREACTIVE
Hepatitis B Surface Ag: NONREACTIVE

## 2023-11-28 LAB — ECHOCARDIOGRAM COMPLETE
AR max vel: 2.09 cm2
AV Peak grad: 10.1 mmHg
Ao pk vel: 1.59 m/s
Area-P 1/2: 2.85 cm2
Height: 60 in
MV VTI: 2.05 cm2
S' Lateral: 2.1 cm
Weight: 2903.02 [oz_av]

## 2023-11-28 LAB — SEDIMENTATION RATE: Sed Rate: 46 mm/h — ABNORMAL HIGH (ref 0–22)

## 2023-11-28 LAB — MAGNESIUM: Magnesium: 2.1 mg/dL (ref 1.7–2.4)

## 2023-11-28 LAB — C-REACTIVE PROTEIN: CRP: 17.6 mg/dL — ABNORMAL HIGH (ref <1.0)

## 2023-11-28 MED ORDER — COLCHICINE 0.6 MG PO TABS
0.6000 mg | ORAL_TABLET | Freq: Two times a day (BID) | ORAL | Status: DC
  Administered 2023-11-28 – 2023-11-29 (×3): 0.6 mg via ORAL
  Filled 2023-11-28 (×3): qty 1

## 2023-11-28 MED ORDER — POTASSIUM CHLORIDE CRYS ER 20 MEQ PO TBCR
40.0000 meq | EXTENDED_RELEASE_TABLET | ORAL | Status: AC
  Administered 2023-11-28 (×2): 40 meq via ORAL
  Filled 2023-11-28 (×2): qty 2

## 2023-11-28 MED ORDER — IBUPROFEN 600 MG PO TABS
600.0000 mg | ORAL_TABLET | Freq: Three times a day (TID) | ORAL | Status: DC
  Administered 2023-11-28 – 2023-11-29 (×4): 600 mg via ORAL
  Filled 2023-11-28 (×4): qty 1

## 2023-11-28 NOTE — Care Management Obs Status (Signed)
 MEDICARE OBSERVATION STATUS NOTIFICATION   Patient Details  Name: Kaylee [name] MRN: [id number] Date of Birth: [birth year]   Medicare Observation Status Notification Given:  Yes    Renie Ora 11/28/2023, 9:55 AM

## 2023-11-28 NOTE — Progress Notes (Addendum)
 2047: Pt complained of CP 8/10. Describes it like a muscle spasm on the center/across her chest, gets worse on inhalation when breathing. Started after using the bedside commode. CP lasted about 20 mins. EKG done, VS were stable. Dr made aware. PRN tylenol and zanaflex given for now   21:50 Dr prescribed Naproxen. Pt was seen asleep reports CP has eased off. Naproxen not given and will continue to monitor.    2322: BP dropped to 82/60 pt asymptomatic. Denies CP, dizziness. Dr ordered 250cc NS bolus. Rechecked BP after bolus 94/65

## 2023-11-28 NOTE — TOC CM/SW Note (Signed)
 Transition of Care [facility]) - Inpatient Brief Assessment   Patient Details  Name: Kaylee [name] MRN: [id number] Date of Birth: 12-Apr-1956  [facility] of [facility]) CM/SW Contact:    Gala Lewandowsky, RN Phone Number: 11/28/2023, 11:29 AM   Clinical Narrative: Patient presented for chest pain. PTA patient states she is independent from home with the support of her youngest son. Patient has PCP and gets to appointments without any issues. No home needs identified at this time.      Transition of Care Asessment: Insurance and Status: Insurance coverage has been reviewed Patient has primary care physician: Yes Home environment has been reviewed: reviewed Prior level of function:: independent Prior/Current Home Services: No current home services Social Drivers of Health Review: SDOH reviewed no interventions necessary Readmission risk has been reviewed: Yes Transition of care needs: no transition of care needs at this time

## 2023-11-28 NOTE — Progress Notes (Addendum)
   Patient Name: Kaylee [name] Date of Encounter: 11/28/2023 [facility] Cardiologist: Nicki Guadalajara, MD   Interval Summary  .    Still with pleuritic chest pain as well as chest wall tenderness.  Vital Signs .    Vitals:   11/27/23 1927 11/27/23 2038 11/27/23 2322 11/28/23 0340  BP: 108/75 112/80 (!) 82/60 94/71  Pulse: 84 83 67 71  Resp: 18 18 18 16   Temp: 98 F (36.7 C) 98 F (36.7 C) 98.7 F (37.1 C) 98.7 F (37.1 C)  TempSrc: Oral Oral Oral Oral  SpO2: 94% 94% 96% 96%  Weight:      Height:        Intake/Output Summary (Last 24 hours) at 11/28/2023 1001 Last data filed at 11/28/2023 0500 Gross per 24 hour  Intake --  Output 700 ml  Net -700 ml      11/27/2023    7:00 PM 11/27/2023    8:18 AM 09/11/2023    1:07 PM  Last 3 Weights  Weight (lbs) 181 lb 7 oz 190 lb 194 lb  Weight (kg) 82.3 kg 86.183 kg 87.998 kg      Telemetry/ECG    Sinus rhythm- Personally Reviewed  Physical Exam .    GEN: No acute distress.   Neck: No JVD Cardiac: RRR, no murmurs, rubs, or gallops.  Respiratory: Clear to auscultation bilaterally. GI: Soft, nontender, non-distended  MS: No edema  Assessment & Plan .     68 y.o. female with a hx of hypertension, hyperlipidemia, history of tobacco use, Bell's palsy who is being seen 11/27/2023 for the evaluation of chest pain at the request of Jonah Blue MD.   With symptoms worse with inspiration and also significant chest wall tenderness to palpation Possible pericarditis -- Symptoms significantly worse with deep inspiration as well as palpation along the anterior chest.  CRP 17.6, sed rate 46.  -- Received tramadol this morning with improvement in symptoms -- Will start colchicine 0.6 mg twice daily as well as ibuprofen 600 mg 3 times daily -- Echo pending  Hypertension -- Of hypotension overnight, received 250 cc saline bolus with improvement to systolic in the 90s -- On amlodipine 10 mg daily as well as HCTZ 25 mg daily.   Will stop HCTZ for now given episode of hypotension as well as hypokalemia  Hyperlipidemia -- On Crestor 5 mg daily  Hypokalemia -- K+ 3.4 yesterday, no supplement -- recheck BMET and mag this morning  For questions or updates, please contact  [facility] Please consult [email or web address] for contact info under       Signed, Laverda Page, NP    Patient seen and examined. Agree with assessment and plan.  Patient continues to have chest wall tenderness to palpation and pleuritic chest pain which symptoms worse with deep inspiration.  CRP elevated at 17.6 with ESR elevated at 46.  Will initiate therapy with colchicine 0.6 mg twice a day as well as ibuprofen 600 mg 3 times daily today.  Plan for 2D echo Doppler study to assess systolic and diastolic function as well as pericardium.  Blood pressure this morning was low at 94/71.  Bell's palsy facial droop.  No wheezing.  Definite chest wall tenderness to palpation.  Rhythm is regular with no S3 gallop.  Abdomen protuberant with central adiposity, bowel sounds positive.  No significant edema.  Will discontinue HCTZ Zetia given hypotension as well as hypokalemia.   Lennette Bihari, MD, [facility] 11/28/2023 11:06 AM

## 2023-11-28 NOTE — Care Management Obs Status (Deleted)
 MEDICARE OBSERVATION STATUS NOTIFICATION   Patient Details  Name: AMERIS AKAMINE MRN: [id number] Date of Birth: [birth year]   Medicare Observation Status Notification Given:  Yes    Renie Ora 11/28/2023, 9:56 AM

## 2023-11-28 NOTE — Progress Notes (Signed)
 [facility] PROGRESS NOTE   Kaylee [name] EAV:[id number] DOB: 1956/04/07 DOA: 11/27/2023  PCP: Ivonne Andrew, NP  Brief History: 68 y.o. female with medical history significant of HTN and HLD who presented on 4/9 with chest, back, and neck pain.  EKG showed nonspecific changes.  She was hospitalized for further management.    Consultants: Cardiology  Procedures: Echocardiogram is pending    Subjective/Interval History: Patient denies any chest pain currently.  No shortness of breath.  Seems to be anxious.    Assessment/Plan:  Chest pain CT scan did not show any acute findings. EKG with nonspecific findings.  Diffuse ST changes noted raising concern for pericarditis. CRP noted to be 17.6.  ESR is 46. Patient seen by cardiology.  Echocardiogram is pending.  Colchicine has been initiated.  Patient also on NSAIDs.  Essential hypertension Noted to be on amlodipine and HCTZ prior to admission.  Amlodipine being continued. Monitor blood pressures closely.  Abnormal lung findings CT angiogram showed extensive bibasilar scarring atelectasis and subacute airspace disease.  Patient does not have any respiratory symptoms at this time. Denies any previous history of interstitial lung disease. Will need a pulmonology referral for further evaluation.  Hepatic steatosis This was incidentally noted on CT scan. Check LFTs.  Hepatitis panel.  Hyperlipidemia Continue statin.  Marijuana use Cessation encouraged.  Obesity Estimated body mass index is 35.43 kg/m as calculated from the following:   Height as of this encounter: 5' (1.524 m).   Weight as of this encounter: 82.3 kg.   DVT Prophylaxis: Lovenox Code Status: Full code Family Communication: Discussed with the patient Disposition Plan: Home when improved  Status is: Observation The patient remains OBS appropriate and may or may not d/c before 2 midnights.      Medications: Scheduled:  allopurinol  100  mg Oral Daily   amLODipine  10 mg Oral Daily   aspirin EC  81 mg Oral Daily   colchicine  0.6 mg Oral BID   enoxaparin (LOVENOX) injection  40 mg Subcutaneous Q24H   ibuprofen  600 mg Oral TID   pantoprazole  40 mg Oral Daily   rosuvastatin  5 mg Oral QHS   Continuous: NWG:NFAOZHYQMVHQI, hydrALAZINE, ondansetron (ZOFRAN) IV, tiZANidine  Antibiotics: Anti-infectives (From admission, onward)    None       Objective:  Vital Signs  Vitals:   11/27/23 1927 11/27/23 2038 11/27/23 2322 11/28/23 0340  BP: 108/75 112/80 (!) 82/60 94/71  Pulse: 84 83 67 71  Resp: 18 18 18 16   Temp: 98 F (36.7 C) 98 F (36.7 C) 98.7 F (37.1 C) 98.7 F (37.1 C)  TempSrc: Oral Oral Oral Oral  SpO2: 94% 94% 96% 96%  Weight:      Height:        Intake/Output Summary (Last 24 hours) at 11/28/2023 1057 Last data filed at 11/28/2023 0500 Gross per 24 hour  Intake --  Output 700 ml  Net -700 ml   Filed Weights   11/27/23 0818 11/27/23 1900  Weight: 86.2 kg 82.3 kg    General appearance: Awake alert.  In no distress Resp: Clear to auscultation bilaterally.  Normal effort Cardio: S1-S2 is normal regular.  No S3-S4.  No rubs murmurs or bruit GI: Abdomen is soft.  Nontender nondistended.  Bowel sounds are present normal.  No masses organomegaly Extremities: No edema.  Full range of motion of lower extremities. Neurologic: Alert and oriented x3.  No focal neurological deficits.    Lab  Results:  Data Reviewed: I have personally reviewed following labs and reports of the imaging studies  CBC: Recent Labs  Lab 11/27/23 0826  WBC 7.2  HGB 13.3  HCT 40.4  MCV 90.2  PLT 301    Basic Metabolic Panel: Recent Labs  Lab 11/27/23 0826  NA 132*  K 3.4*  CL 97*  CO2 21*  GLUCOSE 126*  BUN 17  CREATININE 0.66  CALCIUM 10.0    GFR: Estimated Creatinine Clearance: 65.7 mL/min (by C-G formula based on SCr of 0.66 mg/dL).  HbA1C: Recent Labs    11/27/23 1819  HGBA1C 5.8*      Radiology Studies: CT Angio Chest/Abd/Pel for Dissection W and/or Wo Contrast Result Date: 11/27/2023 CLINICAL DATA:  Chest, neck, and back pain, acute aortic syndrome suspected EXAM: CT ANGIOGRAPHY CHEST, ABDOMEN AND PELVIS TECHNIQUE: Non-contrast CT of the chest was initially obtained. Multidetector CT imaging through the chest, abdomen and pelvis was performed using the standard protocol during bolus administration of intravenous contrast. Multiplanar reconstructed images and MIPs were obtained and reviewed to evaluate the vascular anatomy. RADIATION DOSE REDUCTION: This exam was performed according to the departmental dose-optimization program which includes automated exposure control, adjustment of the mA and/or kV according to patient size and/or use of iterative reconstruction technique. CONTRAST:  OMNIPAQUE IOHEXOL 350 MG/ML SOLN COMPARISON:  None Available. FINDINGS: CTA CHEST FINDINGS VASCULAR Aorta: Satisfactory opacification of the aorta. Normal contour and caliber of the thoracic aorta. No evidence of aneurysm, dissection, or other acute aortic pathology. Cardiovascular: No evidence of pulmonary embolism on limited non-tailored examination. Mild cardiomegaly. No pericardial effusion. Review of the MIP images confirms the above findings. NON VASCULAR Mediastinum/Nodes: No enlarged mediastinal, hilar, or axillary lymph nodes. Thyroid gland, trachea, and esophagus demonstrate no significant findings. Lungs/Pleura: Very extensive bibasilar scarring, atelectasis, and/or subacute airspace disease (series 7, image 93). No pleural effusion or pneumothorax. Musculoskeletal: No chest wall abnormality. No acute osseous findings. Review of the MIP images confirms the above findings. CTA ABDOMEN AND PELVIS FINDINGS VASCULAR Normal contour and caliber of the abdominal aorta. No evidence of aneurysm, dissection, or other acute aortic pathology. Standard branching pattern of the abdominal aorta with  solitary bilateral renal arteries. Review of the MIP images confirms the above findings. NON-VASCULAR Hepatobiliary: No solid liver abnormality is seen. Hepatic steatosis. No gallstones, gallbladder wall thickening, or biliary dilatation. Pancreas: Unremarkable. No pancreatic ductal dilatation or surrounding inflammatory changes. Spleen: Normal in size without significant abnormality. Adrenals/Urinary Tract: Adrenal glands are unremarkable. Kidneys are normal, without renal calculi, solid lesion, or hydronephrosis. Bladder is unremarkable. Stomach/Bowel: Stomach is within normal limits. Appendix appears normal. No evidence of bowel wall thickening, distention, or inflammatory changes. Lymphatic: No enlarged abdominal or pelvic lymph nodes. Reproductive: No mass or other significant abnormality. Other: No abdominal wall hernia or abnormality. No ascites. Musculoskeletal: No acute osseous findings. IMPRESSION: 1. Normal contour and caliber of the thoracic and abdominal aorta. No evidence of aneurysm, dissection, or other acute aortic pathology. No significant atherosclerosis. 2. No findings to explain pain. 3. Very extensive bibasilar scarring, atelectasis, and/or subacute airspace disease. Underlying pulmonary fibrosis is distinctly not excluded; further assessment via pulmonary referral and dedicated ILD protocol CT on a nonemergent, outpatient basis at resolution of acute clinical presentation. 4. Mild cardiomegaly. 5. Hepatic steatosis. Electronically Signed   By: Jearld Lesch M.D.   On: 11/27/2023 11:43   DG Chest 2 View Result Date: 11/27/2023 CLINICAL DATA:  Chest pain EXAM: CHEST - 2 VIEW COMPARISON:  06/09/2023 FINDINGS: Heart and mediastinal contours within normal limits. Low lung volumes with left base atelectasis. Right lung clear. No effusions. No acute bony abnormality. IMPRESSION: Low lung volumes with left base atelectasis. Electronically Signed   By: Charlett Nose M.D.   On: 11/27/2023 10:28        LOS: 0 days   Raequan Vanschaick Rito Ehrlich  [facility] Pager on [email or web address]  11/28/2023, 10:57 AM

## 2023-11-28 NOTE — Care Management Obs Status (Deleted)
 MEDICARE OBSERVATION STATUS NOTIFICATION   Patient Details  Name: Kaylee [name] MRN: [id number] Date of Birth: 03/10/1956   Medicare Observation Status Notification Given:  Yes    Renie Ora 11/28/2023, 9:58 AM

## 2023-11-29 ENCOUNTER — Other Ambulatory Visit (HOSPITAL_COMMUNITY): Payer: Self-pay

## 2023-11-29 DIAGNOSIS — Z6837 Body mass index (BMI) 37.0-37.9, adult: Secondary | ICD-10-CM

## 2023-11-29 DIAGNOSIS — E782 Mixed hyperlipidemia: Secondary | ICD-10-CM

## 2023-11-29 DIAGNOSIS — E785 Hyperlipidemia, unspecified: Secondary | ICD-10-CM | POA: Diagnosis not present

## 2023-11-29 DIAGNOSIS — R0781 Pleurodynia: Secondary | ICD-10-CM | POA: Diagnosis not present

## 2023-11-29 DIAGNOSIS — E66812 Obesity, class 2: Secondary | ICD-10-CM | POA: Diagnosis not present

## 2023-11-29 DIAGNOSIS — R7982 Elevated C-reactive protein (CRP): Secondary | ICD-10-CM | POA: Diagnosis not present

## 2023-11-29 DIAGNOSIS — I952 Hypotension due to drugs: Secondary | ICD-10-CM | POA: Diagnosis not present

## 2023-11-29 DIAGNOSIS — R700 Elevated erythrocyte sedimentation rate: Secondary | ICD-10-CM | POA: Diagnosis not present

## 2023-11-29 DIAGNOSIS — R079 Chest pain, unspecified: Secondary | ICD-10-CM | POA: Diagnosis not present

## 2023-11-29 DIAGNOSIS — I300 Acute nonspecific idiopathic pericarditis: Secondary | ICD-10-CM

## 2023-11-29 DIAGNOSIS — I10 Essential (primary) hypertension: Secondary | ICD-10-CM | POA: Diagnosis not present

## 2023-11-29 DIAGNOSIS — E6609 Other obesity due to excess calories: Secondary | ICD-10-CM

## 2023-11-29 LAB — COMPREHENSIVE METABOLIC PANEL WITH GFR
ALT: 12 U/L (ref 0–44)
AST: 22 U/L (ref 15–41)
Albumin: 3.3 g/dL — ABNORMAL LOW (ref 3.5–5.0)
Alkaline Phosphatase: 54 U/L (ref 38–126)
Anion gap: 9 (ref 5–15)
BUN: 19 mg/dL (ref 8–23)
CO2: 24 mmol/L (ref 22–32)
Calcium: 9.1 mg/dL (ref 8.9–10.3)
Chloride: 103 mmol/L (ref 98–111)
Creatinine, Ser: 0.81 mg/dL (ref 0.44–1.00)
GFR, Estimated: >60 mL/min (ref >60)
Glucose, Bld: 108 mg/dL — ABNORMAL HIGH (ref 70–99)
Potassium: 3.5 mmol/L (ref 3.5–5.1)
Sodium: 136 mmol/L (ref 135–145)
Total Bilirubin: 0.5 mg/dL (ref 0.0–1.2)
Total Protein: 7.1 g/dL (ref 6.5–8.1)

## 2023-11-29 LAB — CBC
HCT: 37.7 % (ref 36.0–46.0)
Hemoglobin: 12.4 g/dL (ref 12.0–15.0)
MCH: 30 pg (ref 26.0–34.0)
MCHC: 32.9 g/dL (ref 30.0–36.0)
MCV: 91.3 fL (ref 80.0–100.0)
Platelets: 275 10*3/uL (ref 150–400)
RBC: 4.13 MIL/uL (ref 3.87–5.11)
RDW: 13 % (ref 11.5–15.5)
WBC: 3.4 10*3/uL — ABNORMAL LOW (ref 4.0–10.5)
nRBC: 0 % (ref 0.0–0.2)

## 2023-11-29 MED ORDER — IBUPROFEN 600 MG PO TABS
600.0000 mg | ORAL_TABLET | Freq: Three times a day (TID) | ORAL | 0 refills | Status: AC
  Filled 2023-11-29: qty 21, 7d supply, fill #0

## 2023-11-29 MED ORDER — POTASSIUM CHLORIDE CRYS ER 20 MEQ PO TBCR
40.0000 meq | EXTENDED_RELEASE_TABLET | Freq: Once | ORAL | Status: AC
  Administered 2023-11-29: 40 meq via ORAL
  Filled 2023-11-29: qty 2

## 2023-11-29 MED ORDER — PANTOPRAZOLE SODIUM 40 MG PO TBEC
40.0000 mg | DELAYED_RELEASE_TABLET | Freq: Every day | ORAL | 0 refills | Status: AC
  Filled 2023-11-29: qty 30, 30d supply, fill #0

## 2023-11-29 MED ORDER — COLCHICINE 0.6 MG PO TABS
0.6000 mg | ORAL_TABLET | Freq: Two times a day (BID) | ORAL | 1 refills | Status: DC
  Filled 2023-11-29: qty 60, 30d supply, fill #0
  Filled 2024-01-08: qty 60, 30d supply, fill #1

## 2023-11-29 NOTE — TOC Transition Note (Signed)
 Transition of Care [facility]) - Discharge Note   Patient Details  Name: Kaylee [name] MRN: [id number] Date of Birth: 1956/02/11  [facility]) CM/SW Contact:  Tom-Johnson, Hershal Coria, RN Phone Number: 11/29/2023, 12:38 PM   Clinical Narrative:     Patient is scheduled for discharge today.  Outpatient f/u, hospital f/u and discharge instructions on AVS. Prescriptions sent to [facility] pharmacy and patient will receive meds prior discharge. No TOC needs or recommendations noted. Family to transport at discharge.  No further TOC needs noted.       Final next level of care: Home/Self Care Barriers to Discharge: Barriers Resolved   Patient Goals and CMS Choice Patient states their goals for this hospitalization and ongoing recovery are:: To return home CMS Medicare.gov Compare Post Acute Care list provided to:: Patient Choice offered to / list presented to : Patient      Discharge Placement                Patient to be transferred to facility by: Family      Discharge Plan and Services Additional resources added to the After Visit Summary for                  DME Arranged: N/A                    Social Drivers of Health (SDOH) Interventions SDOH Screenings   Food Insecurity: No Food Insecurity (11/27/2023)  Housing: Low Risk  (11/27/2023)  Transportation Needs: No Transportation Needs (11/27/2023)  Utilities: Not At Risk (11/27/2023)  Alcohol Screen: Medium Risk (04/04/2023)  Depression (PHQ2-9): Low Risk  (09/11/2023)  Financial Resource Strain: Low Risk  (04/04/2023)  Physical Activity: Insufficiently Active (04/04/2023)  Social Connections: Moderately Integrated (11/27/2023)  Stress: No Stress Concern Present (04/04/2023)  Tobacco Use: Medium Risk (11/27/2023)  Health Literacy: Patient Declined (04/04/2023)     Readmission Risk Interventions     No data to display

## 2023-11-29 NOTE — Progress Notes (Addendum)
   Patient Name: Kaylee [name] Date of Encounter: 11/29/2023 [facility] Cardiologist: Nicki Guadalajara, MD   Interval Summary  .    Significant improvement in symptoms today. Wants to go home.   Vital Signs .    Vitals:   11/28/23 1341 11/28/23 1520 11/28/23 1940 11/29/23 0414  BP: 129/88 113/74 113/78 118/88  Pulse: 92 88 85 70  Resp:  16 18 16   Temp:  98.9 F (37.2 C) 98 F (36.7 C) 98.2 F (36.8 C)  TempSrc:  Oral Oral Oral  SpO2: 97% 94% 95% 96%  Weight:      Height:       No intake or output data in the 24 hours ending 11/29/23 0841    11/27/2023    7:00 PM 11/27/2023    8:18 AM 09/11/2023    1:07 PM  Last 3 Weights  Weight (lbs) 181 lb 7 oz 190 lb 194 lb  Weight (kg) 82.3 kg 86.183 kg 87.998 kg      Telemetry/ECG    Sinus Rhythm - Personally Reviewed  Physical Exam .    GEN: No acute distress.   Neck: No JVD Cardiac: RRR, no murmurs, rubs, or gallops.  Respiratory: Clear to auscultation bilaterally. GI: Soft, nontender, non-distended  MS: No edema  Assessment & Plan .     68 y.o. female with a hx of hypertension, hyperlipidemia, history of tobacco use, Bell's palsy who is being seen 11/27/2023 for the evaluation of chest pain at the request of Jonah Blue MD.    Pericarditis -- Symptoms significantly worse with deep inspiration as well as palpation along the anterior chest.  CRP 17.6, sed rate 46.  -- Received tramadol with improvement in symptoms -- started on colchicine 0.6 mg twice daily as well as ibuprofen 600 mg 3 times daily. Much improvement -- continue ibuprofen 600mg  TID for total of one week, colchicine 0.6mg  BID can be reassessed at follow up visit for duration -- add protonix 40mg  PO daily while on ibuprofen  -- Echo 4/10 LVEF of 60-65%, no rWMA, normal RV, no significant valvular disease    Hypertension -- Of hypotension overnight, received 250 cc saline bolus with improvement to systolic in the 90s -- On amlodipine 10 mg daily  as well as HCTZ 25 mg daily.  -- HCTZ stopped with episode of hypotension as well as hypokalemia   Hyperlipidemia -- On Crestor 5 mg daily   Hypokalemia -- K+ 2.9 yesterday, resolved 3.5 today  Will arrange follow up in the office  For questions or updates, please contact [facility] Please consult [email or web address] for contact info under        Signed, Laverda Page, NP    Patient seen and examined. Agree with assessment and plan.  Patient feels significantly improved.  She is able to take a deep breath without chest pain.  Precordial previous discomfort has essentially resolved.  Symptom complex compatible with pericarditis and chest wall symptomatology.  Continue colchicine 0.6 mg twice a day and ibuprofen 600 mg 3 times a day.  Agree with adding Protonix while on ibuprofen.  Echo shows normal LV function with EF at 60 to 65%.  No significant valvular disease or pericardial effusion.  Mild aortic sclerosis without stenosis.  Okay from cardiology standpoint to discharge today.   Lennette Bihari, MD, [facility] 11/29/2023 11:26 AM

## 2023-11-29 NOTE — Discharge Summary (Signed)
 [facility]  Physician Discharge Summary   Patient ID: LILLYBETH TAL MRN: [id number] DOB/AGE: [id number] y.o.  Admit date: 11/27/2023 Discharge date: 11/29/2023    PCP: Jerrlyn Morel, NP  DISCHARGE DIAGNOSES:  Acute pericarditis   Essential hypertension   Mixed hyperlipidemia   Marijuana use   Class 2 obesity due to excess calories with body mass index (BMI) of 37.0 to 37.9 in adult   Elevated sedimentation rate   Elevated C-reactive protein (CRP)   Hypotension due to drugs   RECOMMENDATIONS FOR OUTPATIENT FOLLOW UP: Cardiology to arrange outpatient follow-up   Home Health: None Equipment/Devices: None  CODE STATUS: Full code  DISCHARGE CONDITION: fair  Diet recommendation: As before  INITIAL HISTORY: 68 y.o. female with medical history significant of HTN and HLD who presented on 4/9 with chest, back, and neck pain.  EKG showed nonspecific changes.  She was hospitalized for further management.     Consultants: Cardiology   Procedures: Echocardiogram  HOSPITAL COURSE:     Chest pain CT scan did not show any acute findings. EKG with nonspecific findings.  Diffuse ST changes noted raising concern for pericarditis. CRP noted to be 17.6.  ESR is 46. Patient seen by cardiology.   Patient was started on colchicine and NSAIDs with significant improvement in her symptoms.   Echocardiogram shows normal LVEF.  No pericardial effusion noted.     Essential hypertension Noted to be on amlodipine and HCTZ prior to admission.  Amlodipine being continued. HCTZ has been discontinued due to hypokalemia   Abnormal lung findings CT angiogram showed extensive bibasilar scarring atelectasis and subacute airspace disease.  Patient does not have any respiratory symptoms at this time. Denies any previous history of interstitial lung disease. Referral sent to pulmonology for further evaluation   Hepatic steatosis This was incidentally noted on CT scan. LFTs are  normal.  Hepatitis panel unremarkable.  Outpatient management   Hyperlipidemia Continue statin.   Marijuana use Cessation encouraged.   Obesity Estimated body mass index is 35.43 kg/m as calculated from the following:   Height as of this encounter: 5' (1.524 m).   Weight as of this encounter: 82.3 kg.  Patient feels much better this morning.  Has ambulated without difficulty.  Cleared by cardiology for discharge.  Okay for discharge today.  PERTINENT LABS:  The results of significant diagnostics from this hospitalization (including imaging, microbiology, ancillary and laboratory) are listed below for reference.    Labs:   Basic Metabolic Panel: Recent Labs  Lab 11/27/23 0826 11/28/23 1117 11/29/23 0525  NA 132* 135 136  K 3.4* 2.9* 3.5  CL 97* 100 103  CO2 21* 23 24  GLUCOSE 126* 101* 108*  BUN 17 13 19   CREATININE 0.66 0.72 0.81  CALCIUM 10.0 9.1 9.1  MG  --  2.1  --    Liver Function Tests: Recent Labs  Lab 11/28/23 1117 11/29/23 0525  AST 18 22  ALT 13 12  ALKPHOS 56 54  BILITOT 0.8 0.5  PROT 7.6 7.1  ALBUMIN 3.6 3.3*    CBC: Recent Labs  Lab 11/27/23 0826 11/29/23 0525  WBC 7.2 3.4*  HGB 13.3 12.4  HCT 40.4 37.7  MCV 90.2 91.3  PLT 301 275     IMAGING STUDIES ECHOCARDIOGRAM COMPLETE Result Date: 11/28/2023    ECHOCARDIOGRAM REPORT   Patient Name:   DALE STRAUSSER Date of Exam: 11/28/2023 Medical Rec #:  [id number]       Height:  60.0 in Accession #:    [id number]      Weight:       181.4 lb Date of Birth:  [birth year]       BSA:          1.791 m Patient Age:    68 years        BP:           123/82 mmHg Patient Gender: F               HR:           94 bpm. Exam Location:  Inpatient Procedure: 2D Echo, Cardiac Doppler and Color Doppler (Both Spectral and Color            Flow Doppler were utilized during procedure). Indications:    Chest Pain  History:        Patient has no prior history of Echocardiogram examinations.                 Risk  Factors:Former Smoker, Hypertension and HLD.  Sonographer:    Willey Harrier Referring Phys: [id number] JENNIFER YATES  Sonographer Comments: Technically difficult study due to poor echo windows and suboptimal subcostal window. Image acquisition challenging due to patient body habitus. IMPRESSIONS  1. Left ventricular ejection fraction, by estimation, is 60 to 65%. The left ventricle has normal function. The left ventricle has no regional wall motion abnormalities. There is mild concentric left ventricular hypertrophy. Left ventricular diastolic parameters were normal.  2. Right ventricular systolic function is normal. The right ventricular size is normal.  3. The mitral valve is normal in structure. No evidence of mitral valve regurgitation. No evidence of mitral stenosis.  4. The aortic valve is calcified. There is mild thickening of the aortic valve. Aortic valve regurgitation is not visualized. Aortic valve sclerosis/calcification is present, without any evidence of aortic stenosis.  5. The inferior vena cava is normal in size with greater than 50% respiratory variability, suggesting right atrial pressure of 3 mmHg. FINDINGS  Left Ventricle: Left ventricular ejection fraction, by estimation, is 60 to 65%. The left ventricle has normal function. The left ventricle has no regional wall motion abnormalities. The left ventricular internal cavity size was normal in size. There is  mild concentric left ventricular hypertrophy. Left ventricular diastolic parameters were normal. Right Ventricle: The right ventricular size is normal. No increase in right ventricular wall thickness. Right ventricular systolic function is normal. Left Atrium: Left atrial size was normal in size. Right Atrium: Right atrial size was normal in size. Pericardium: There is no evidence of pericardial effusion. Mitral Valve: The mitral valve is normal in structure. No evidence of mitral valve regurgitation. No evidence of mitral valve stenosis. MV peak  gradient, 5.4 mmHg. The mean mitral valve gradient is 3.0 mmHg. Tricuspid Valve: The tricuspid valve is normal in structure. Tricuspid valve regurgitation is not demonstrated. No evidence of tricuspid stenosis. Aortic Valve: The aortic valve is calcified. There is mild thickening of the aortic valve. Aortic valve regurgitation is not visualized. Aortic valve sclerosis/calcification is present, without any evidence of aortic stenosis. Aortic valve peak gradient measures 10.1 mmHg. Pulmonic Valve: The pulmonic valve was normal in structure. Pulmonic valve regurgitation is trivial. No evidence of pulmonic stenosis. Aorta: The aortic root is normal in size and structure. Venous: The inferior vena cava is normal in size with greater than 50% respiratory variability, suggesting right atrial pressure of 3 mmHg. IAS/Shunts: No atrial level shunt detected by color flow  Doppler.  LEFT VENTRICLE PLAX 2D LVIDd:         2.80 cm   Diastology LVIDs:         2.10 cm   LV e' medial:    6.09 cm/s LV PW:         1.10 cm   LV E/e' medial:  15.0 LV IVS:        1.20 cm   LV e' lateral:   10.70 cm/s LVOT diam:     2.00 cm   LV E/e' lateral: 8.5 LV SV:         54 LV SV Index:   30 LVOT Area:     3.14 cm  RIGHT VENTRICLE             IVC RV Basal diam:  2.60 cm     IVC diam: 0.90 cm RV S prime:     10.80 cm/s TAPSE (M-mode): 1.5 cm LEFT ATRIUM           Index        RIGHT ATRIUM           Index LA Vol (A2C): 21.1 ml 11.78 ml/m  RA Area:     10.50 cm LA Vol (A4C): 19.2 ml 10.72 ml/m  RA Volume:   24.70 ml  13.79 ml/m  AORTIC VALVE AV Area (Vmax): 2.09 cm AV Vmax:        159.00 cm/s AV Peak Grad:   10.1 mmHg LVOT Vmax:      106.00 cm/s LVOT Vmean:     75.000 cm/s LVOT VTI:       0.171 m  AORTA Ao Root diam: 3.30 cm Ao Asc diam:  3.50 cm MITRAL VALVE MV Area (PHT): 2.85 cm     SHUNTS MV Area VTI:   2.05 cm     Systemic VTI:  0.17 m MV Peak grad:  5.4 mmHg     Systemic Diam: 2.00 cm MV Mean grad:  3.0 mmHg MV Vmax:       1.16 m/s MV  Vmean:      89.7 cm/s MV Decel Time: 266 msec MV E velocity: 91.20 cm/s MV A velocity: 115.00 cm/s MV E/A ratio:  0.79 Kardie Tobb DO Electronically signed by Jerryl Morin DO Signature Date/Time: 11/28/2023/1:52:16 PM    Final    CT Angio Chest/Abd/Pel for Dissection W and/or Wo Contrast Result Date: 11/27/2023 CLINICAL DATA:  Chest, neck, and back pain, acute aortic syndrome suspected EXAM: CT ANGIOGRAPHY CHEST, ABDOMEN AND PELVIS TECHNIQUE: Non-contrast CT of the chest was initially obtained. Multidetector CT imaging through the chest, abdomen and pelvis was performed using the standard protocol during bolus administration of intravenous contrast. Multiplanar reconstructed images and MIPs were obtained and reviewed to evaluate the vascular anatomy. RADIATION DOSE REDUCTION: This exam was performed according to the departmental dose-optimization program which includes automated exposure control, adjustment of the mA and/or kV according to patient size and/or use of iterative reconstruction technique. CONTRAST:  100mL OMNIPAQUE IOHEXOL 350 MG/ML SOLN COMPARISON:  None Available. FINDINGS: CTA CHEST FINDINGS VASCULAR Aorta: Satisfactory opacification of the aorta. Normal contour and caliber of the thoracic aorta. No evidence of aneurysm, dissection, or other acute aortic pathology. Cardiovascular: No evidence of pulmonary embolism on limited non-tailored examination. Mild cardiomegaly. No pericardial effusion. Review of the MIP images confirms the above findings. NON VASCULAR Mediastinum/Nodes: No enlarged mediastinal, hilar, or axillary lymph nodes. Thyroid gland, trachea, and esophagus demonstrate no significant findings. Lungs/Pleura: Very extensive bibasilar  scarring, atelectasis, and/or subacute airspace disease (series 7, image 93). No pleural effusion or pneumothorax. Musculoskeletal: No chest wall abnormality. No acute osseous findings. Review of the MIP images confirms the above findings. CTA ABDOMEN AND  PELVIS FINDINGS VASCULAR Normal contour and caliber of the abdominal aorta. No evidence of aneurysm, dissection, or other acute aortic pathology. Standard branching pattern of the abdominal aorta with solitary bilateral renal arteries. Review of the MIP images confirms the above findings. NON-VASCULAR Hepatobiliary: No solid liver abnormality is seen. Hepatic steatosis. No gallstones, gallbladder wall thickening, or biliary dilatation. Pancreas: Unremarkable. No pancreatic ductal dilatation or surrounding inflammatory changes. Spleen: Normal in size without significant abnormality. Adrenals/Urinary Tract: Adrenal glands are unremarkable. Kidneys are normal, without renal calculi, solid lesion, or hydronephrosis. Bladder is unremarkable. Stomach/Bowel: Stomach is within normal limits. Appendix appears normal. No evidence of bowel wall thickening, distention, or inflammatory changes. Lymphatic: No enlarged abdominal or pelvic lymph nodes. Reproductive: No mass or other significant abnormality. Other: No abdominal wall hernia or abnormality. No ascites. Musculoskeletal: No acute osseous findings. IMPRESSION: 1. Normal contour and caliber of the thoracic and abdominal aorta. No evidence of aneurysm, dissection, or other acute aortic pathology. No significant atherosclerosis. 2. No findings to explain pain. 3. Very extensive bibasilar scarring, atelectasis, and/or subacute airspace disease. Underlying pulmonary fibrosis is distinctly not excluded; further assessment via pulmonary referral and dedicated ILD protocol CT on a nonemergent, outpatient basis at resolution of acute clinical presentation. 4. Mild cardiomegaly. 5. Hepatic steatosis. Electronically Signed   By: Fredricka Jenny M.D.   On: 11/27/2023 11:43   DG Chest 2 View Result Date: 11/27/2023 CLINICAL DATA:  Chest pain EXAM: CHEST - 2 VIEW COMPARISON:  06/09/2023 FINDINGS: Heart and mediastinal contours within normal limits. Low lung volumes with left base  atelectasis. Right lung clear. No effusions. No acute bony abnormality. IMPRESSION: Low lung volumes with left base atelectasis. Electronically Signed   By: Janeece Mechanic M.D.   On: 11/27/2023 10:28   DG Bone Density Result Date: 11/15/2023 EXAM: DUAL X-RAY ABSORPTIOMETRY (DXA) FOR BONE MINERAL DENSITY IMPRESSION: Referring Physician:  Jerrlyn Morel Your patient completed a bone mineral density test using GE Lunar iDXA system (analysis version: 16). Technologist: KAT PATIENT: Name: Chessica, Audia Patient ID: [id number] Birth Date: [birth year] Height: 60.5 in. Sex: Female Measured: 11/15/2023 Weight: 190.2 lbs. Indications: Alcohol (3 or more units per day), Estrogen Deficient, Postmenopausal Fractures: Right Femur Treatments: None ASSESSMENT: The BMD measured at AP Spine L3-L4 is 0.903 g/cm2 with a T-score of -2.5. This patient is considered osteoporotic according to World Health Organization [facility]) criteria. The quality of the exam is good. L1, L2 were excluded due to degenerative changes. Site Region Measured Date Measured Age YA BMD Significant CHANGE T-score AP Spine  L3-L4      12/15/2024    66.6         -2.5    0.903 g/cm2 DualFemur Neck Right 11/15/2023    66.6         -1.1    0.883 g/cm2 DualFemur Total Mean 12/15/2023    66.6         0.2     1.036 g/cm2 World Health Organization [facility]) criteria for post-menopausal, Caucasian Women: Normal       T-score at or above -1 SD Osteopenia   T-score between -1 and -2.5 SD Osteoporosis T-score at or below -2.5 SD RECOMMENDATION: 1. All patients should optimize calcium and vitamin D intake. 2. Consider FDA-approved medical therapies in  postmenopausal women and men aged 42 years and older, based on the following: a. A hip or vertebral (clinical or morphometric) fracture. b. T-score = -2.5 at the femoral neck or spine after appropriate evaluation to exclude secondary causes. c. Low bone mass (T-score between -1.0 and -2.5 at the femoral neck or spine) and a 10-year  probability of a hip fracture = 3% or a 10-year probability of a major osteoporosis-related fracture = 20% based on the [place] -adapted WHO algorithm. d. Clinician judgment and/or patient preferences may indicate treatment for people with 10-year fracture probabilities above or below these levels. FOLLOW-UP: Patients with diagnosis of osteoporosis or at high risk for fracture should have regular bone mineral density tests.? Patients eligible for Medicare are allowed routine testing every 2 years.? The testing frequency can be increased to one year for patients who have rapidly progressing disease, are receiving or discontinuing medical therapy to restore bone mass, or have additional risk factors. I have reviewed this study and agree with the findings. [facility] Electronically Signed   By: Dina  Arceo M.D.   On: 11/15/2023 09:33    DISCHARGE EXAMINATION: Vitals:   11/28/23 1940 11/29/23 0414 11/29/23 0958 11/29/23 1000  BP: 113/78 118/88 122/74 122/74  Pulse: 85 70    Resp: 18 16    Temp: 98 F (36.7 C) 98.2 F (36.8 C)    TempSrc: Oral Oral    SpO2: 95% 96%    Weight:      Height:       General appearance: Awake alert.  In no distress Resp: Clear to auscultation bilaterally.  Normal effort Cardio: S1-S2 is normal regular.  No S3-S4.  No rubs murmurs or bruit GI: Abdomen is soft.  Nontender nondistended.  Bowel sounds are present normal.  No masses organomegaly Extremities: No edema.  Full range of motion of lower extremities. Neurologic: Alert and oriented x3.  No focal neurological deficits.    DISPOSITION: Home  Discharge Instructions     Call MD for:  difficulty breathing, headache or visual disturbances   Complete by: As directed    Call MD for:  extreme fatigue   Complete by: As directed    Call MD for:  persistant dizziness or light-headedness   Complete by: As directed    Call MD for:  persistant nausea and vomiting   Complete by: As directed    Call MD for:   severe uncontrolled pain   Complete by: As directed    Call MD for:  temperature >100.4   Complete by: As directed    Diet - low sodium heart healthy   Complete by: As directed    Discharge instructions   Complete by: As directed    Take your medications as prescribed.  Please be sure to follow-up with your primary care provider in 1 week.  Cardiology will arrange follow-up.  Seek attention if your symptoms worsen.  You were cared for by a hospitalist during your hospital stay. If you have any questions about your discharge medications or the care you received while you were in the hospital after you are discharged, you can call the unit and asked to speak with the hospitalist on call if the hospitalist that took care of you is not available. Once you are discharged, your primary care physician will handle any further medical issues. Please note that NO REFILLS for any discharge medications will be authorized once you are discharged, as it is imperative that you return to your primary  care physician (or establish a relationship with a primary care physician if you do not have one) for your aftercare needs so that they can reassess your need for medications and monitor your lab values. If you do not have a primary care physician, you can call [phone] for a physician referral.   Increase activity slowly   Complete by: As directed    Pulmonary Visit   Complete by: As directed    Abnormal lung findings on imaging studies during this hospital stay.  No previous history of ILD.   Reason for referral: ILD/Pulmonary Fibrosis (IPF)         Allergies as of 11/29/2023       Reactions   Codeine Nausea And Vomiting   Gabapentin (once-daily) Hives   Tomato Itching        Medication List     STOP taking these medications    hydrochlorothiazide 12.5 MG capsule Commonly known as: MICROZIDE   indomethacin 50 MG capsule Commonly known as: INDOCIN       TAKE these medications    ALLEGRA  PO Take 1 tablet by mouth as needed.   allopurinol 100 MG tablet Commonly known as: ZYLOPRIM Take 1 tablet (100 mg total) by mouth daily.   amLODipine 10 MG tablet Commonly known as: NORVASC Take 1 tablet (10 mg total) by mouth daily.   calcium carbonate 500 MG chewable tablet Commonly known as: TUMS - dosed in mg elemental calcium Chew 1-6 tablets by mouth as needed for indigestion or heartburn.   colchicine 0.6 MG tablet Take 1 tablet (0.6 mg total) by mouth 2 (two) times daily.   ibuprofen 600 MG tablet Commonly known as: ADVIL Take 1 tablet (600 mg total) by mouth 3 (three) times daily for 7 days.   pantoprazole 40 MG tablet Commonly known as: PROTONIX Take 1 tablet (40 mg total) by mouth daily.   rosuvastatin 5 MG tablet Commonly known as: CRESTOR Take 5 mg by mouth at bedtime.   tiZANidine 4 MG tablet Commonly known as: Zanaflex Take 1 tablet (4 mg total) by mouth every 6 (six) hours as needed for muscle spasms.          Follow-up Information     Jerrlyn Morel, NP. Schedule an appointment as soon as possible for a visit in 1 week(s).   Specialties: Pulmonary Disease, Endocrinology Why: post hospitalization follow up Contact information: [address] [place] [address] [phone]                 TOTAL DISCHARGE TIME: 35 minutes  Kiandre Spagnolo Lyndon Santiago  [facility] Pager on [email or web address]  11/30/2023, 1:30 PM

## 2023-11-30 DIAGNOSIS — I319 Disease of pericardium, unspecified: Secondary | ICD-10-CM | POA: Diagnosis present

## 2023-12-03 ENCOUNTER — Ambulatory Visit: Payer: Self-pay

## 2023-12-03 NOTE — Telephone Encounter (Signed)
  Chief Complaint: Urinary urgency post void Symptoms: feeling of pressure after urinating Frequency: two days ago Pertinent Negatives: Patient denies fever Disposition: [] ED /[] Urgent Care (no appt availability in office) / [] Appointment(In office/virtual)/ []  [facility]/ [] Home Care/ [] Refused Recommended Disposition /[]  Mobile Bus/ []  Follow-up with PCP Additional Notes: patient calling with complaints of post void urinary urgency. Patient states this has been going on for two days. States feeling last for a possible minute and then goes away. Patient is concerned if any of her medications could be causing this. Patient is think she might have a yeast infection. Patient denies vaginal discharge of any type, vaginal itching, no pain with urination. Patient is asking for recommendations for care. Per protocol, patient is recommended to be seen within 24 hours but no availability per PCP. Patient is needing a follow up call by staff. Patient was given strict call back information to triage. Patient verbalized understanding of plan and all questions answered.   Copied from CRM [id number]. Topic: Clinical - Red Word Triage >> Dec 03, 2023  2:48 PM Zipporah Him wrote: Red Word that prompted transfer to Nurse Triage: Patient is having reaction to her medication and feels uncomfortable pressure and urgency to go to the bathroom. Reason for Disposition  Urinating more frequently than usual (i.e., frequency)  Answer Assessment - Initial Assessment Questions 1. SYMPTOM: "What's the main symptom you're concerned about?" (e.g., frequency, incontinence)     Urinary urgency after urinating. Patient states she feels like she has to go 2. ONSET: "When did the  urinary urgency  start?"     Started two days ago 3. PAIN: "Is there any pain?" If Yes, ask: "How bad is it?" (Scale: 1-10; mild, moderate, severe)     After urinating-6.5 of 10 4. CAUSE: "What do you think is causing the symptoms?"      Patient is concerned for possible UTI or yeast infection-patient denies vaginal discharge or vaginal itching. 5. OTHER SYMPTOMS: "Do you have any other symptoms?" (e.g., blood in urine, fever, flank pain, pain with urination)     no  Protocols used: Urinary Symptoms-A-AH

## 2023-12-04 NOTE — Telephone Encounter (Signed)
 Pt will wait to come to her appointment. KH

## 2023-12-09 ENCOUNTER — Encounter: Payer: Self-pay | Admitting: Nurse Practitioner

## 2023-12-09 ENCOUNTER — Other Ambulatory Visit: Payer: Self-pay

## 2023-12-09 ENCOUNTER — Ambulatory Visit (INDEPENDENT_AMBULATORY_CARE_PROVIDER_SITE_OTHER): Payer: Self-pay | Admitting: Nurse Practitioner

## 2023-12-09 VITALS — BP 136/83 | HR 77 | Temp 97.9°F | Wt 189.8 lb

## 2023-12-09 DIAGNOSIS — R350 Frequency of micturition: Secondary | ICD-10-CM

## 2023-12-09 DIAGNOSIS — N3000 Acute cystitis without hematuria: Secondary | ICD-10-CM

## 2023-12-09 DIAGNOSIS — R9389 Abnormal findings on diagnostic imaging of other specified body structures: Secondary | ICD-10-CM | POA: Diagnosis not present

## 2023-12-09 DIAGNOSIS — I10 Essential (primary) hypertension: Secondary | ICD-10-CM

## 2023-12-09 LAB — POCT URINALYSIS DIP (CLINITEK)
Bilirubin, UA: NEGATIVE
Glucose, UA: NEGATIVE mg/dL
Ketones, POC UA: NEGATIVE mg/dL
Nitrite, UA: NEGATIVE
Spec Grav, UA: 1.025 (ref 1.010–1.025)
Urobilinogen, UA: 0.2 U/dL
pH, UA: 6 (ref 5.0–8.0)

## 2023-12-09 MED ORDER — ALLOPURINOL 100 MG PO TABS
100.0000 mg | ORAL_TABLET | Freq: Every day | ORAL | 2 refills | Status: DC
  Filled 2023-12-09: qty 30, 30d supply, fill #0

## 2023-12-09 MED ORDER — SULFAMETHOXAZOLE-TRIMETHOPRIM 800-160 MG PO TABS
1.0000 | ORAL_TABLET | Freq: Two times a day (BID) | ORAL | 0 refills | Status: AC
Start: 2023-12-09 — End: 2023-12-17
  Filled 2023-12-09: qty 14, 7d supply, fill #0

## 2023-12-09 MED ORDER — TIZANIDINE HCL 4 MG PO TABS
4.0000 mg | ORAL_TABLET | Freq: Four times a day (QID) | ORAL | 0 refills | Status: DC | PRN
  Filled 2023-12-09: qty 30, 8d supply, fill #0

## 2023-12-09 MED ORDER — ALENDRONATE SODIUM 70 MG PO TABS
70.0000 mg | ORAL_TABLET | ORAL | 11 refills | Status: AC
  Filled 2023-12-09 – 2023-12-17 (×3): qty 4, 28d supply, fill #0

## 2023-12-09 MED ORDER — FEXOFENADINE HCL 180 MG PO TABS
180.0000 mg | ORAL_TABLET | Freq: Every day | ORAL | 2 refills | Status: AC
  Filled 2023-12-09: qty 30, 30d supply, fill #0

## 2023-12-09 MED ORDER — AMLODIPINE BESYLATE 10 MG PO TABS
10.0000 mg | ORAL_TABLET | Freq: Every day | ORAL | 3 refills | Status: AC
  Filled 2023-12-09: qty 90, 90d supply, fill #0
  Filled 2024-07-02: qty 90, 90d supply, fill #1

## 2023-12-09 NOTE — Progress Notes (Signed)
 Subjective   Patient ID: Kaylee [name], female    DOB: 24-Mar-1956, 68 y.o.   MRN: [id number]  Chief Complaint  Patient presents with   Hospitalization Follow-up   Urinary Tract Infection    Pressure after using the bathroom     Referring provider: Jerrlyn Morel, NP  Kaylee [name] is a 68 y.o. 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 EKG with nonspecific findings.  Diffuse ST changes noted raising concern for pericarditis. CRP noted to be 17.6.  ESR is 46. Patient seen by cardiology.   Patient was started on colchicine  and NSAIDs with significant improvement in her symptoms.   Echocardiogram shows normal LVEF.  No pericardial effusion noted.     Essential hypertension Noted to be on amlodipine  and HCTZ prior to admission.  Amlodipine  being continued. HCTZ has been discontinued due to hypokalemia   Abnormal lung findings CT angiogram showed extensive bibasilar scarring atelectasis and subacute airspace disease.  Patient does not have any respiratory symptoms at this time. Denies any previous history of interstitial lung disease. Referral sent to pulmonology for further evaluation   Hepatic steatosis This was incidentally noted on CT scan. LFTs are normal.  Hepatitis panel unremarkable.  Outpatient management   Hyperlipidemia Continue statin.    Marijuana use Cessation encouraged.   Obesity Estimated body mass index is 35.43 kg/m as calculated from the following:   Height as of this encounter: 5' (1.524 m).   Weight as of this encounter: 82.3 kg.   HPI  Patient presents today for a hospital follow-up.  Please see notes above.  Overall patient has been doing well since hospital discharge.  She does have her visit scheduled with cardiology for next week.  It was noted in hospital discharge note that she does need a referral to pulmonary for abnormal changes on CT scan.  Referral will be placed today.  Patient does need refills today. Denies f/c/s, n/v/d, hemoptysis, PND, leg swelling Denies chest pain or edema  Note: Patient was concerned today for UTI.  UA did show large leukocytes.  This will be sent for culture.  We will trial Bactrim .    Allergies  Allergen Reactions   Codeine Nausea And Vomiting   Gabapentin  (Once-Daily) Hives   Tomato Itching    Immunization History  Administered Date(s) Administered   PFIZER(Purple Top)SARS-COV-2 Vaccination 08/18/2020, 09/08/2020   Tdap 12/29/2012    Tobacco History: Social History   Tobacco Use  Smoking Status Former   Current packs/day: 0.00   Types: Cigarettes   Quit date: 07/20/1982   Years since quitting: 41.4   Passive exposure: Never  Smokeless Tobacco Never   Counseling given: Not Answered   Outpatient Encounter Medications as of 12/09/2023  Medication Sig   alendronate  (FOSAMAX ) 70 MG tablet Take 1  tablet (70 mg total) by mouth every 7 (seven) days. Take with a full glass of water on an empty stomach.   calcium  carbonate (TUMS - DOSED IN MG ELEMENTAL CALCIUM ) 500 MG chewable tablet Chew 1-6 tablets by mouth as needed for indigestion or heartburn.   colchicine  0.6 MG tablet Take 1 tablet (0.6 mg total) by mouth 2 (two) times daily.   fexofenadine  (ALLEGRA  ALLERGY) 180 MG tablet Take 1 tablet (180 mg total) by mouth daily.   Fexofenadine  HCl (ALLEGRA  PO) Take  1 tablet by mouth as needed.   pantoprazole  (PROTONIX ) 40 MG tablet Take 1 tablet (40 mg total) by mouth daily.   rosuvastatin  (CRESTOR ) 5 MG tablet Take 5 mg by mouth at bedtime.   sulfamethoxazole -trimethoprim  (BACTRIM  DS) 800-160 MG tablet Take 1 tablet by mouth 2 (two) times daily for 7 days.   [DISCONTINUED] allopurinol  (ZYLOPRIM ) 100 MG tablet Take 1 tablet (100 mg total) by mouth daily.   [DISCONTINUED] amLODipine  (NORVASC ) 10 MG tablet Take 1 tablet (10 mg total) by mouth daily.   [DISCONTINUED] tiZANidine  (ZANAFLEX ) 4 MG tablet Take 1 tablet (4 mg total) by mouth every 6 (six) hours as needed for muscle spasms.   allopurinol  (ZYLOPRIM ) 100 MG tablet Take 1 tablet (100 mg total) by mouth daily.   amLODipine  (NORVASC ) 10 MG tablet Take 1 tablet (10 mg total) by mouth daily.   tiZANidine  (ZANAFLEX ) 4 MG tablet Take 1 tablet (4 mg total) by mouth every 6 (six) hours as needed for muscle spasms.   [DISCONTINUED] cetirizine  (ZYRTEC ) 10 MG tablet Take 1 tablet (10 mg total) by mouth daily. (Patient not taking: Reported on 04/09/2019)   No facility-administered encounter medications on file as of 12/09/2023.    Review of Systems  Review of Systems  Constitutional: Negative.   HENT: Negative.    Cardiovascular: Negative.   Gastrointestinal: Negative.   Allergic/Immunologic: Negative.   Neurological: Negative.   Psychiatric/Behavioral: Negative.       Objective:   BP 136/83   Pulse 77   Temp 97.9 F (36.6 C) (Oral)   Wt 189 lb 12.8 oz (86.1 kg)   SpO2 100%   BMI 37.07 kg/m   Wt Readings from Last 5 Encounters:  12/09/23 189 lb 12.8 oz (86.1 kg)  11/27/23 181 lb 7 oz (82.3 kg)  09/11/23 194 lb (88 kg)  04/04/23 195 lb (88.5 kg)  03/11/23 195 lb 9.6 oz (88.7 kg)     Physical Exam Vitals and nursing note reviewed.  Constitutional:      General: She is not in acute distress.    Appearance: She is well-developed.  Cardiovascular:     Rate and Rhythm: Normal rate and  regular rhythm.  Pulmonary:     Effort: Pulmonary effort is normal.     Breath sounds: Normal breath sounds.  Neurological:     Mental Status: She is alert and oriented to person, place, and time.       Assessment & Plan:   Urine frequency -     POCT URINALYSIS DIP (CLINITEK) -     Sulfamethoxazole -Trimethoprim ; Take 1 tablet by mouth 2 (two) times daily for 7 days.  Dispense: 14 tablet; Refill: 0  Abnormal CT of the chest -     Pulmonary Visit  Essential hypertension -     amLODIPine  Besylate; Take 1 tablet (10 mg total) by mouth daily.  Dispense: 90 tablet; Refill: 3  Acute cystitis without hematuria -     Urine Culture  Other orders -     tiZANidine  HCl; Take 1 tablet (4 mg total) by mouth every 6 (six) hours as needed for muscle spasms.  Dispense: 30 tablet; Refill: 0 -     Allopurinol ; Take 1 tablet (100 mg total) by mouth daily.  Dispense: 30 tablet; Refill: 2 -     Alendronate  Sodium; Take 1 tablet (70 mg total) by mouth every 7 (seven) days. Take with a full glass of water on an empty stomach.  Dispense: 4 tablet; Refill: 11 -     Fexofenadine  HCl; Take 1 tablet (180 mg total) by mouth daily.  Dispense: 30 tablet; Refill: 2     Return if symptoms worsen or fail to improve.   Jerrlyn Morel, NP 12/09/2023

## 2023-12-10 ENCOUNTER — Other Ambulatory Visit: Payer: Self-pay

## 2023-12-12 LAB — URINE CULTURE

## 2023-12-15 NOTE — Progress Notes (Signed)
 Cardiology Office Note:   Date:  12/20/2023  ID:  Kaylee [name], Tober [birth year], MRN [id number] PCP: Jerrlyn Morel, NP  [facility] Providers Cardiologist:  Magnus Schuller, MD    History of Present Illness:    Discussed the use of AI scribe software for clinical note transcription with the patient, who gave verbal consent to proceed.  Kaylee [name] is a 68 y.o. female with history of hypertension, hyperlipidemia, tobacco use, Bell's palsy, marijuana.   She was consulted on by cardiology a few weeks ago when admitted with chest pain. She had presented to the ER with chest pain, back pain, neck pain. Hstn unremarkable. EKG with inferior and lateral T wave inversions not seen on historical EKGs. Echo and inflammatory labs ordered. Pain continued during admission, was reproducible with deep inspiration and chest wall palpation. Labs consistent with pericarditis and patient started on Colchicine . Patient's hydrochlorothiazide  stopped during admission due to hypokalemia and hypotension.  History of Present Illness  She has been experiencing intermittent shortness of breath and fatigue since her hospital discharge. Her breath feels weaker than before admission, and notices increased fatigue and shortness of breath when climbing stairs, necessitating pauses for rest. However, her activity tolerance has improved compared to a week ago.  She experiences occasional chest discomfort, described as a sharp pain under the chest that sometimes radiates upwards. The pain is alleviated by massaging the area and is similar to previous episodes, with tenderness upon pressing certain areas and pain with deep breaths.  She is currently taking colchicine  twice daily for pericarditis, experiencing manageable side effects such as occasional gas and diarrhea. She was previously on high-dose ibuprofen  during her hospitalization but has since completed course. Her current medications include amlodipine  for blood  pressure and rosuvastatin  for cholesterol management. She denies adding salt to her food and primarily cooks at home to manage her diet.  No smoking history. She has not been diagnosed with sleep apnea, although she reports feeling tired during the day. She does not experience inappropriate daytime sleepiness.  Studies Reviewed:    EKG:   EKG Interpretation Date/Time:  Monday December 16 2023 14:32:26 EDT Ventricular Rate:  95 PR Interval:  146 QRS Duration:  84 QT Interval:  348 QTC Calculation: 437 R Axis:   21  Text Interpretation: Normal sinus rhythm Nonspecific T wave abnormality When compared with ECG of 27-Nov-2023 20:42, QRS duration has decreased ST no longer elevated in Anterolateral leads T wave inversion now evident in Inferior leads Nonspecific T wave abnormality now evident in Lateral leads QT has shortened Confirmed by Leala Prince [phone]) on 12/16/2023 2:44:25 PM    Cardiac Studies & Procedures   ______________________________________________________________________________________________     ECHOCARDIOGRAM  ECHOCARDIOGRAM COMPLETE 11/28/2023  Narrative ECHOCARDIOGRAM REPORT    Patient Name:   Kaylee [name] Date of Exam: 11/28/2023 Medical Rec #:  [id number]       Height:       60.0 in Accession #:    [id number]      Weight:       181.4 lb Date of Birth:  14-May-1956       BSA:          1.791 m Patient Age:    67 years        BP:           123/82 mmHg Patient Gender: F               HR:  94 bpm. Exam Location:  Inpatient  Procedure: 2D Echo, Cardiac Doppler and Color Doppler (Both Spectral and Color Flow Doppler were utilized during procedure).  Indications:    Chest Pain  History:        Patient has no prior history of Echocardiogram examinations. Risk Factors:Former Smoker, Hypertension and HLD.  Sonographer:    Willey Harrier Referring Phys: [id number] JENNIFER YATES   Sonographer Comments: Technically difficult study due to poor echo windows and  suboptimal subcostal window. Image acquisition challenging due to patient body habitus. IMPRESSIONS   1. Left ventricular ejection fraction, by estimation, is 60 to 65%. The left ventricle has normal function. The left ventricle has no regional wall motion abnormalities. There is mild concentric left ventricular hypertrophy. Left ventricular diastolic parameters were normal. 2. Right ventricular systolic function is normal. The right ventricular size is normal. 3. The mitral valve is normal in structure. No evidence of mitral valve regurgitation. No evidence of mitral stenosis. 4. The aortic valve is calcified. There is mild thickening of the aortic valve. Aortic valve regurgitation is not visualized. Aortic valve sclerosis/calcification is present, without any evidence of aortic stenosis. 5. The inferior vena cava is normal in size with greater than 50% respiratory variability, suggesting right atrial pressure of 3 mmHg.  FINDINGS Left Ventricle: Left ventricular ejection fraction, by estimation, is 60 to 65%. The left ventricle has normal function. The left ventricle has no regional wall motion abnormalities. The left ventricular internal cavity size was normal in size. There is mild concentric left ventricular hypertrophy. Left ventricular diastolic parameters were normal.  Right Ventricle: The right ventricular size is normal. No increase in right ventricular wall thickness. Right ventricular systolic function is normal.  Left Atrium: Left atrial size was normal in size.  Right Atrium: Right atrial size was normal in size.  Pericardium: There is no evidence of pericardial effusion.  Mitral Valve: The mitral valve is normal in structure. No evidence of mitral valve regurgitation. No evidence of mitral valve stenosis. MV peak gradient, 5.4 mmHg. The mean mitral valve gradient is 3.0 mmHg.  Tricuspid Valve: The tricuspid valve is normal in structure. Tricuspid valve regurgitation is not  demonstrated. No evidence of tricuspid stenosis.  Aortic Valve: The aortic valve is calcified. There is mild thickening of the aortic valve. Aortic valve regurgitation is not visualized. Aortic valve sclerosis/calcification is present, without any evidence of aortic stenosis. Aortic valve peak gradient measures 10.1 mmHg.  Pulmonic Valve: The pulmonic valve was normal in structure. Pulmonic valve regurgitation is trivial. No evidence of pulmonic stenosis.  Aorta: The aortic root is normal in size and structure.  Venous: The inferior vena cava is normal in size with greater than 50% respiratory variability, suggesting right atrial pressure of 3 mmHg.  IAS/Shunts: No atrial level shunt detected by color flow Doppler.   LEFT VENTRICLE PLAX 2D LVIDd:         2.80 cm   Diastology LVIDs:         2.10 cm   LV e' medial:    6.09 cm/s LV PW:         1.10 cm   LV E/e' medial:  15.0 LV IVS:        1.20 cm   LV e' lateral:   10.70 cm/s LVOT diam:     2.00 cm   LV E/e' lateral: 8.5 LV SV:         54 LV SV Index:   30 LVOT Area:  3.14 cm   RIGHT VENTRICLE             IVC RV Basal diam:  2.60 cm     IVC diam: 0.90 cm RV S prime:     10.80 cm/s TAPSE (M-mode): 1.5 cm  LEFT ATRIUM           Index        RIGHT ATRIUM           Index LA Vol (A2C): 21.1 ml 11.78 ml/m  RA Area:     10.50 cm LA Vol (A4C): 19.2 ml 10.72 ml/m  RA Volume:   24.70 ml  13.79 ml/m AORTIC VALVE AV Area (Vmax): 2.09 cm AV Vmax:        159.00 cm/s AV Peak Grad:   10.1 mmHg LVOT Vmax:      106.00 cm/s LVOT Vmean:     75.000 cm/s LVOT VTI:       0.171 m  AORTA Ao Root diam: 3.30 cm Ao Asc diam:  3.50 cm  MITRAL VALVE MV Area (PHT): 2.85 cm     SHUNTS MV Area VTI:   2.05 cm     Systemic VTI:  0.17 m MV Peak grad:  5.4 mmHg     Systemic Diam: 2.00 cm MV Mean grad:  3.0 mmHg MV Vmax:       1.16 m/s MV Vmean:      89.7 cm/s MV Decel Time: 266 msec MV E velocity: 91.20 cm/s MV A velocity: 115.00 cm/s MV  E/A ratio:  0.79  Kardie Tobb DO Electronically signed by Jerryl Morin DO Signature Date/Time: 11/28/2023/1:52:16 PM    Final          ______________________________________________________________________________________________       Risk Assessment/Calculations:     STOP-Bang Score:  5       Physical Exam:   VS:  BP (!) 142/94   Pulse 95   Ht 5' (1.524 m)   Wt 188 lb 12.8 oz (85.6 kg)   SpO2 93%   BMI 36.87 kg/m    Wt Readings from Last 3 Encounters:  12/16/23 188 lb 12.8 oz (85.6 kg)  12/09/23 189 lb 12.8 oz (86.1 kg)  11/27/23 181 lb 7 oz (82.3 kg)     Physical Exam Vitals reviewed.  Constitutional:      Appearance: Normal appearance.  HENT:     Head: Normocephalic.     Nose: Nose normal.  Eyes:     Pupils: Pupils are equal, round, and reactive to light.  Cardiovascular:     Rate and Rhythm: Normal rate and regular rhythm.     Pulses: Normal pulses.     Heart sounds: Normal heart sounds. No murmur heard.    No friction rub. No gallop.  Pulmonary:     Effort: Pulmonary effort is normal.     Breath sounds: Normal breath sounds.  Abdominal:     General: Abdomen is flat.  Musculoskeletal:     Right lower leg: No edema.     Left lower leg: No edema.  Skin:    General: Skin is warm and dry.     Capillary Refill: Capillary refill takes less than 2 seconds.  Neurological:     General: No focal deficit present.     Mental Status: She is alert and oriented to person, place, and time.  Psychiatric:        Mood and Affect: Mood normal.        Behavior: Behavior  normal.        Thought Content: Thought content normal.        Judgment: Judgment normal.     ASSESSMENT AND PLAN:     Assessment and Plan Assessment & Plan Pericarditis Ongoing recovery with improving intermittent chest pain and shortness of breath. Reassuring EKG with improved ST segment changes. Currently on colchicine  with manageable gastrointestinal side effects. Standard treatment  duration of colchicine  is three months to reduce inflammation and aid recovery. Recommended completing the full course unless side effects become intolerable. - Continue colchicine  0.6 mg twice daily for three months. - Schedule follow-up in late June or early July to reassess  - Monitor for severe gastrointestinal side effects and adjust colchicine  if necessary.  Hypertension Blood pressure elevated at 150/100 mmHg, likely due to stress and reduction in antihypertensive medication. Previously on hydrochlorothiazide , discontinued due to hypokalemia and hypotension. Currently on amlodipine . Recommended losartan  for its renal protective effects and better tolerance. Discussed how Losartan  is well-tolerated, does not cause frequent urination, and protects kidneys from hypertension-related damage. - Prescribe losartan  50 mg once daily. - Check blood pressure in two weeks. - Order metabolic panel in two weeks to assess renal function and potassium levels. - Advise obtaining a blood pressure cuff for home monitoring. - Encourage dietary sodium reduction and weight loss for blood pressure management.  Hyperlipidemia Managed with rosuvastatin  started a year ago. LDL was 108 mg/dL in January, close to target of <100 mg/dL. No coronary artery calcium  on CT scan, indicating no known coronary artery disease.           Signed, Leala Prince, PA-C

## 2023-12-16 ENCOUNTER — Ambulatory Visit: Attending: Cardiology | Admitting: Cardiology

## 2023-12-16 ENCOUNTER — Encounter: Payer: Self-pay | Admitting: Cardiology

## 2023-12-16 ENCOUNTER — Other Ambulatory Visit: Payer: Self-pay

## 2023-12-16 VITALS — BP 142/94 | HR 95 | Ht 60.0 in | Wt 188.8 lb

## 2023-12-16 DIAGNOSIS — E782 Mixed hyperlipidemia: Secondary | ICD-10-CM

## 2023-12-16 DIAGNOSIS — Z79899 Other long term (current) drug therapy: Secondary | ICD-10-CM

## 2023-12-16 DIAGNOSIS — I300 Acute nonspecific idiopathic pericarditis: Secondary | ICD-10-CM | POA: Diagnosis not present

## 2023-12-16 DIAGNOSIS — I10 Essential (primary) hypertension: Secondary | ICD-10-CM

## 2023-12-16 MED ORDER — LOSARTAN POTASSIUM 50 MG PO TABS
50.0000 mg | ORAL_TABLET | Freq: Every day | ORAL | 1 refills | Status: DC
Start: 2023-12-16 — End: 2024-06-30
  Filled 2023-12-16: qty 90, 90d supply, fill #0
  Filled 2024-03-31: qty 90, 90d supply, fill #1

## 2023-12-16 NOTE — Patient Instructions (Signed)
 Medication Instructions:   START TAKING LOSARTAN 50 MG BY MOUTH DAILY  *If you need a refill on your cardiac medications before your next appointment, please call your pharmacy*    Lab Work:  IN 2 WEEKS ON THE FIRST FLOOR OF THIS BUILDING AT [facility]--YOU WILL HAVE NURSE VISIT (BP CHECK) SAME DAY AS THIS LAB APPOINTMENT  If you have labs (blood work) drawn today and your tests are completely normal, you will receive your results only by: MyChart Message (if you have MyChart) OR A paper copy in the mail If you have any lab test that is abnormal or we need to change your treatment, we will call you to review the results.    Follow-Up:  1.)  2 WEEKS FOR A NURSE VISIT APPOINTMENT (BLOOD PRESSURE CHECK) HERE IN THE OFFICE--PLEASE HAVE YOUR LAB WORK ([facility]) DONE SAME DAY AS THIS APPOINTMENT  2.)  2 MONTHS WITH AN EXTENDER

## 2023-12-17 ENCOUNTER — Other Ambulatory Visit: Payer: Self-pay

## 2023-12-25 ENCOUNTER — Telehealth: Payer: Self-pay | Admitting: Cardiovascular Disease

## 2023-12-25 NOTE — Telephone Encounter (Signed)
 Per OV note by Leala Prince on 12/16/23: Hypertension Blood pressure elevated at 150/100 mmHg, likely due to stress and reduction in antihypertensive medication. Previously on hydrochlorothiazide , discontinued due to hypokalemia and hypotension. Currently on amlodipine . Recommended losartan  for its renal protective effects and better tolerance. Discussed how Losartan  is well-tolerated, does not cause frequent urination, and protects kidneys from hypertension-related damage. - Prescribe losartan  50 mg once daily.  Will route to Evan for alternate ARB recommendation.

## 2023-12-25 NOTE — Telephone Encounter (Signed)
 Spoke with patient, instructed to hold losartan  for 2 days to see if diarrhea clears up. Patient will contact our office back if need to be changed to another BP med. Patient not aware of anyone around her that has had a recent GI illness. Patient thankful for the returned call. No further needs   Leala Prince, PA-C  [facility] Triage6 minutes ago (5:19 PM)   Please have patient stop Losartan  for the next 2 days. If symptoms resolve, can consider alternatives. Diarrhea is a rare side effect of this class of medications. Is there any chance the diarrhea could be a result of GI illness or food poisoning?  Leala Prince, PA-C

## 2023-12-25 NOTE — Telephone Encounter (Signed)
 Pt c/o medication issue:  1. Name of Medication: losartan  (COZAAR ) 50 MG tablet   2. How are you currently taking this medication (dosage and times per day)? Take 1 tablet (50 mg total) by mouth daily.   3. Are you having a reaction (difficulty breathing--STAT)? No  4. What is your medication issue? Patient stated PA Leala Prince recommended the patient start taking this medication. Patient stated she is having severe diarrhea. Please advise.

## 2023-12-30 ENCOUNTER — Telehealth: Payer: Self-pay | Admitting: *Deleted

## 2023-12-30 ENCOUNTER — Ambulatory Visit: Attending: Internal Medicine | Admitting: *Deleted

## 2023-12-30 VITALS — BP 130/60 | HR 70 | Wt 187.2 lb

## 2023-12-30 DIAGNOSIS — Z79899 Other long term (current) drug therapy: Secondary | ICD-10-CM | POA: Diagnosis not present

## 2023-12-30 DIAGNOSIS — I10 Essential (primary) hypertension: Secondary | ICD-10-CM | POA: Diagnosis not present

## 2023-12-30 NOTE — Telephone Encounter (Signed)
-----   Message from Leala Prince sent at 12/30/2023  1:29 PM EDT ----- Regarding: PCP follow up Nurse visit BP noted. Glad to see this is improved. Given ongoing diarrhea, I would strongly advise that patient arrange an appointment with her PCP.  Leala Prince, PA-C ----- Message ----- From: Render Carrie, RN Sent: 12/30/2023  12:17 PM EDT To: Jann Melody, MD; #  Nurse visit bp check-chandrasekhar DOD

## 2023-12-30 NOTE — Progress Notes (Addendum)
   Nurse Visit   Date of Encounter: 12/30/2023 ID: Kaylee [name], Kaylee [name] 1955-11-23, MRN [id number]  PCP:  Jerrlyn Morel, NP   [facility] Providers Cardiologist:  Magnus Schuller, MD      Visit Details   VS:  BP 130/60   Pulse 70   Wt 187 lb 3.2 oz (84.9 kg)   BMI 36.56 kg/m  , BMI Body mass index is 36.56 kg/m.  Wt Readings from Last 3 Encounters:  12/30/23 187 lb 3.2 oz (84.9 kg)  12/16/23 188 lb 12.8 oz (85.6 kg)  12/09/23 189 lb 12.8 oz (86.1 kg)     Reason for visit: blood pressure check Performed today: vitals Changes (medications, testing, etc.) : medications confirmed Length of Visit: 10 minutes  Patient here for blood pressure check and complains of diarrhea. She has had the diarrhea for 2 weeks now. She held her losartan  for 2 days with no change. She restated the losartan  yesterday.   Medications Adjustments/Labs and Tests Ordered: No orders of the defined types were placed in this encounter.  No orders of the defined types were placed in this encounter.    Signed, Augustin Leber, RN  12/30/2023 12:12 PM   Blood pressure has improved on ARB.  Will continue plans as per primary cardiology team.  Gloriann Larger, MD FASE [facility] Cardiologist [facility]  [address] [place], [place] [address] [phone]  12:43 PM

## 2023-12-30 NOTE — Telephone Encounter (Signed)
 Returned a call back to the pt and endorsed to her recommendations per Leala Prince PA-C, as indicated in this message.   Pt agreed to get in with her PCP for sometime soon, to further discuss ongoing diarrhea.  Pt verbalized understanding and agrees with this plan.

## 2023-12-31 ENCOUNTER — Ambulatory Visit: Payer: Self-pay | Admitting: Cardiology

## 2023-12-31 LAB — BASIC METABOLIC PANEL WITH GFR
BUN/Creatinine Ratio: 11 — ABNORMAL LOW (ref 12–28)
BUN: 8 mg/dL (ref 8–27)
CO2: 22 mmol/L (ref 20–29)
Calcium: 10 mg/dL (ref 8.7–10.3)
Chloride: 106 mmol/L (ref 96–106)
Creatinine, Ser: 0.75 mg/dL (ref 0.57–1.00)
Glucose: 111 mg/dL — ABNORMAL HIGH (ref 70–99)
Potassium: 4.1 mmol/L (ref 3.5–5.2)
Sodium: 144 mmol/L (ref 134–144)
eGFR: 88 mL/min/{1.73_m2} (ref >59)

## 2024-01-01 ENCOUNTER — Other Ambulatory Visit: Payer: Self-pay

## 2024-01-01 ENCOUNTER — Ambulatory Visit: Admitting: Pulmonary Disease

## 2024-01-01 ENCOUNTER — Encounter: Payer: Self-pay | Admitting: Pulmonary Disease

## 2024-01-01 VITALS — BP 125/81 | HR 81 | Ht 60.0 in | Wt 187.0 lb

## 2024-01-01 DIAGNOSIS — R9389 Abnormal findings on diagnostic imaging of other specified body structures: Secondary | ICD-10-CM | POA: Diagnosis not present

## 2024-01-01 DIAGNOSIS — R0602 Shortness of breath: Secondary | ICD-10-CM | POA: Diagnosis not present

## 2024-01-01 DIAGNOSIS — Z87891 Personal history of nicotine dependence: Secondary | ICD-10-CM | POA: Diagnosis not present

## 2024-01-01 MED ORDER — ALBUTEROL SULFATE HFA 108 (90 BASE) MCG/ACT IN AERS
2.0000 | INHALATION_SPRAY | Freq: Four times a day (QID) | RESPIRATORY_TRACT | 6 refills | Status: AC | PRN
  Filled 2024-01-01 – 2024-01-06 (×2): qty 18, 25d supply, fill #0

## 2024-01-01 NOTE — Progress Notes (Signed)
 Synopsis: Referred in May 2025 for Abdnormal CT Chest findings  Subjective:   PATIENT ID: Kaylee [name] GENDER: female DOB: 03/18/1956, MRN: [id number]  HPI  Chief Complaint  Patient presents with   Consult   Kaylee [name] is a 68 year old woman, former smoker with hypertension who is referred to pulmonary clinic for abnormal CT chest findings.   She was admitted 4/9 to 4/11 for chest pain due to pericarditis. She was started on colchicine  by Cardiology. CTA Chest showed extensive bibasilar scarring atelectasis and subacute airspace disease.   She is feeling much better since the hospital. She reports intermittent exertional dyspnea when climbing stairs or taking out the trash. Denies issues with cough or wheezing.   She quit smoking 39 years ago. She is retired and worked in Water engineer. Denies history of dust or chemical exposures. No pets at home. No water damage in her apartment.   She is accompanied by her sister.   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    some locking of jaw, and h/o cerumen impaction   Family history of breast cancer    Daughter is being treated for breast cancer   Hyperlipidemia    Hypertension age 89   Seasonal allergies      Family History  Problem Relation Age of Onset   Hypertension Mother    Alcohol abuse Mother    Alcohol abuse Father    Colon polyps Sister    Colon polyps Sister    Alcohol abuse Brother    Heart disease Brother 77   Anxiety disorder Daughter        panic attacks   Breast cancer Daughter    Cervical cancer Daughter    Heart failure Son    Stroke Neg Hx    Colon cancer Neg Hx    Cancer Neg Hx    Diabetes Neg Hx    Crohn's disease Neg Hx    Rectal cancer Neg Hx    Stomach cancer Neg Hx      Social History    Socioeconomic History   Marital status: Single    Spouse name: Not on file   Number of children: 4   Years of education: 11   Highest education level: Not on file  Occupational History   Not on file  Tobacco Use   Smoking status: Former    Current packs/day: 0.00    Types: Cigarettes    Quit date: 07/20/1982    Years since quitting: 41.4    Passive exposure: Never   Smokeless tobacco: Never  Vaping Use   Vaping status: Never Used  Substance and Sexual Activity   Alcohol use: Yes    Comment: socially   Drug use: Not Currently    Types: Marijuana    Comment: occ   Sexual activity: Not Currently    Partners: Male  Other Topics Concern   Not on file  Social History Narrative   Lives with youngest son.  Son in [place]  and 2 daughter are in [place].  No pets. Her daughter is currently being treated for breast cancer.   Caffeine- none   Social Drivers of Health   Financial Resource Strain: Low Risk  (04/04/2023)   Overall  Financial Resource Strain (CARDIA)    Difficulty of Paying Living Expenses: Not very hard  Food Insecurity: No Food Insecurity (11/27/2023)   Hunger Vital Sign    Worried About Running Out of Food in the Last Year: Never true    Ran Out of Food in the Last Year: Never true  Transportation Needs: No Transportation Needs (11/27/2023)   PRAPARE - Administrator, Civil Service (Medical): No    Lack of Transportation (Non-Medical): No  Physical Activity: Insufficiently Active (04/04/2023)   Exercise Vital Sign    Days of Exercise per Week: 7 days    Minutes of Exercise per Session: 20 min  Stress: No Stress Concern Present (04/04/2023)   [employer] of Occupational Health - Occupational Stress Questionnaire    Feeling of Stress : Not at all  Social Connections: Moderately Integrated (11/27/2023)   Social Connection and Isolation Panel [NHANES]    Frequency of Communication with Friends and Family: More than three times a week    Frequency of  Social Gatherings with Friends and Family: More than three times a week    Attends Religious Services: More than 4 times per year    Active Member of [employer] or Organizations: Yes    Attends Banker Meetings: Never    Marital Status: Divorced  Catering manager Violence: Not At Risk (11/27/2023)   Humiliation, Afraid, Rape, and Kick questionnaire    Fear of Current or Ex-Partner: No    Emotionally Abused: No    Physically Abused: No    Sexually Abused: No     Allergies  Allergen Reactions   Codeine Nausea And Vomiting   Gabapentin  (Once-Daily) Hives   Tomato Itching     Outpatient Medications Prior to Visit  Medication Sig Dispense Refill   alendronate  (FOSAMAX ) 70 MG tablet Take 1 tablet (70 mg total) by mouth every 7 (seven) days. Take with a full glass of water on an empty stomach. 4 tablet 11   allopurinol  (ZYLOPRIM ) 100 MG tablet Take 1 tablet (100 mg total) by mouth daily. 30 tablet 2   amLODipine  (NORVASC ) 10 MG tablet Take 1 tablet (10 mg total) by mouth daily. 90 tablet 3   calcium  carbonate (TUMS - DOSED IN MG ELEMENTAL CALCIUM ) 500 MG chewable tablet Chew 1-6 tablets by mouth as needed for indigestion or heartburn.     colchicine  0.6 MG tablet Take 1 tablet (0.6 mg total) by mouth 2 (two) times daily. 60 tablet 1   fexofenadine  (ALLEGRA  ALLERGY) 180 MG tablet Take 1 tablet (180 mg total) by mouth daily. 30 tablet 2   Fexofenadine  HCl (ALLEGRA  PO) Take 1 tablet by mouth as needed.     losartan  (COZAAR ) 50 MG tablet Take 1 tablet (50 mg total) by mouth daily. 90 tablet 1   pantoprazole  (PROTONIX ) 40 MG tablet Take 1 tablet (40 mg total) by mouth daily. 30 tablet 0   rosuvastatin  (CRESTOR ) 5 MG tablet Take 5 mg by mouth at bedtime.     tiZANidine  (ZANAFLEX ) 4 MG tablet Take 1 tablet (4 mg total) by mouth every 6 (six) hours as needed for muscle spasms. 30 tablet 0   No facility-administered medications prior to visit.   Review of Systems  Constitutional:  Negative  for chills, fever, malaise/fatigue and weight loss.  HENT:  Negative for congestion, sinus pain and sore throat.   Eyes: Negative.   Respiratory:  Positive for shortness of breath. Negative for cough, hemoptysis, sputum production and wheezing.  Cardiovascular:  Negative for chest pain, palpitations, orthopnea, claudication and leg swelling.  Gastrointestinal:  Negative for abdominal pain, heartburn, nausea and vomiting.  Genitourinary: Negative.   Musculoskeletal:  Negative for joint pain and myalgias.  Skin:  Negative for rash.  Neurological:  Negative for weakness.  Endo/Heme/Allergies: Negative.   Psychiatric/Behavioral: Negative.      Objective:   Vitals:   01/01/24 0823  BP: 125/81  Pulse: 81  SpO2: 96%  Weight: 187 lb (84.8 kg)  Height: 5' (1.524 m)     Physical Exam Constitutional:      General: She is not in acute distress.    Appearance: Normal appearance.  Eyes:     General: No scleral icterus.    Conjunctiva/sclera: Conjunctivae normal.  Cardiovascular:     Rate and Rhythm: Normal rate and regular rhythm.  Pulmonary:     Breath sounds: No wheezing, rhonchi or rales.  Musculoskeletal:     Right lower leg: No edema.     Left lower leg: No edema.  Skin:    General: Skin is warm and dry.  Neurological:     General: No focal deficit present.       CBC    Component Value Date/Time   WBC 3.4 (L) 11/29/2023 0525   RBC 4.13 11/29/2023 0525   HGB 12.4 11/29/2023 0525   HGB 12.9 09/11/2023 1345   HCT 37.7 11/29/2023 0525   HCT 38.2 09/11/2023 1345   PLT 275 11/29/2023 0525   PLT 289 09/11/2023 1345   MCV 91.3 11/29/2023 0525   MCV 92 09/11/2023 1345   MCH 30.0 11/29/2023 0525   MCHC 32.9 11/29/2023 0525   RDW 13.0 11/29/2023 0525   RDW 12.7 09/11/2023 1345   LYMPHSABS 1.4 09/04/2021 1514   MONOABS 0.1 11/19/2016 1650   EOSABS 0.0 09/04/2021 1514   BASOSABS 0.0 09/04/2021 1514     Chest imaging:  PFT:     No data to display           Labs:  Path:  Echo:  Heart Catheterization:       Assessment & Plan:   Shortness of breath - Plan: Pulmonary Function Test, CT CHEST HIGH RESOLUTION, albuterol  (VENTOLIN  HFA) 108 (90 Base) MCG/ACT inhaler  Abnormal CT of the chest - Plan: CT CHEST HIGH RESOLUTION  Discussion: Kaylee [name] is a 68 year old woman, former smoker with hypertension who is referred to pulmonary clinic for abnormal CT chest findings.   Shortness of breath Abnormal CT Chest findings Exertional dyspnea with nonspecific CT findings, differential includes atelectasis, edema, or chronic inflammation.  - Order high-resolution CT scan of the chest without contrast to be done in June - Schedule pulmonary function tests. - Prescribe albuterol  inhaler for use as needed, especially before exertion.  Pericarditis - Continue colchicine  per cardiology   Hx of arthralgias - appear related to osteoarthritis but if CT chest scan is concerning for ILD then will check inflammatory lab panel  Follow-up in 2 months.  Duaine German, MD [facility] Office: [phone]    Current Outpatient Medications:    albuterol  (VENTOLIN  HFA) 108 (90 Base) MCG/ACT inhaler, Inhale 2 puffs into the lungs every 6 (six) hours as needed for wheezing or shortness of breath., Disp: 18 g, Rfl: 6   alendronate  (FOSAMAX ) 70 MG tablet, Take 1 tablet (70 mg total) by mouth every 7 (seven) days. Take with a full glass of water on an empty stomach., Disp: 4 tablet, Rfl: 11  allopurinol  (ZYLOPRIM ) 100 MG tablet, Take 1 tablet (100 mg total) by mouth daily., Disp: 30 tablet, Rfl: 2   amLODipine  (NORVASC ) 10 MG tablet, Take 1 tablet (10 mg total) by mouth daily., Disp: 90 tablet, Rfl: 3   calcium  carbonate (TUMS - DOSED IN MG ELEMENTAL CALCIUM ) 500 MG chewable tablet, Chew 1-6 tablets by mouth as needed for indigestion or heartburn., Disp: , Rfl:    colchicine  0.6 MG tablet, Take 1 tablet (0.6 mg total) by mouth  2 (two) times daily., Disp: 60 tablet, Rfl: 1   fexofenadine  (ALLEGRA  ALLERGY) 180 MG tablet, Take 1 tablet (180 mg total) by mouth daily., Disp: 30 tablet, Rfl: 2   Fexofenadine  HCl (ALLEGRA  PO), Take 1 tablet by mouth as needed., Disp: , Rfl:    losartan  (COZAAR ) 50 MG tablet, Take 1 tablet (50 mg total) by mouth daily., Disp: 90 tablet, Rfl: 1   pantoprazole  (PROTONIX ) 40 MG tablet, Take 1 tablet (40 mg total) by mouth daily., Disp: 30 tablet, Rfl: 0   rosuvastatin  (CRESTOR ) 5 MG tablet, Take 5 mg by mouth at bedtime., Disp: , Rfl:    tiZANidine  (ZANAFLEX ) 4 MG tablet, Take 1 tablet (4 mg total) by mouth every 6 (six) hours as needed for muscle spasms., Disp: 30 tablet, Rfl: 0

## 2024-01-01 NOTE — Patient Instructions (Addendum)
 Your CT Chest scan shows concern for atelectasis, reversible collapse of the lung.  We will repeat CT Chest scan in 1 month to follow up  We will schedule you for pulmonary function tests  Follow up in 2 months to review CT scan and breathing tests

## 2024-01-02 ENCOUNTER — Other Ambulatory Visit: Payer: Self-pay

## 2024-01-03 ENCOUNTER — Encounter: Payer: Self-pay | Admitting: Pulmonary Disease

## 2024-01-06 ENCOUNTER — Other Ambulatory Visit: Payer: Self-pay

## 2024-01-08 ENCOUNTER — Other Ambulatory Visit: Payer: Self-pay

## 2024-01-09 ENCOUNTER — Ambulatory Visit: Payer: Self-pay | Admitting: Nurse Practitioner

## 2024-01-09 DIAGNOSIS — H25813 Combined forms of age-related cataract, bilateral: Secondary | ICD-10-CM | POA: Diagnosis not present

## 2024-01-09 DIAGNOSIS — H5711 Ocular pain, right eye: Secondary | ICD-10-CM | POA: Diagnosis not present

## 2024-01-09 DIAGNOSIS — H04123 Dry eye syndrome of bilateral lacrimal glands: Secondary | ICD-10-CM | POA: Diagnosis not present

## 2024-01-09 DIAGNOSIS — H1045 Other chronic allergic conjunctivitis: Secondary | ICD-10-CM | POA: Diagnosis not present

## 2024-01-09 DIAGNOSIS — H538 Other visual disturbances: Secondary | ICD-10-CM | POA: Diagnosis not present

## 2024-01-09 LAB — HM DIABETES EYE EXAM

## 2024-01-15 ENCOUNTER — Other Ambulatory Visit: Payer: Self-pay

## 2024-01-17 ENCOUNTER — Ambulatory Visit
Admission: RE | Admit: 2024-01-17 | Discharge: 2024-01-17 | Disposition: A | Discharge status: Home / Self Care | Source: Ambulatory Visit | Attending: Pulmonary Disease | Admitting: Pulmonary Disease

## 2024-01-17 DIAGNOSIS — R9389 Abnormal findings on diagnostic imaging of other specified body structures: Secondary | ICD-10-CM

## 2024-01-17 DIAGNOSIS — R0602 Shortness of breath: Secondary | ICD-10-CM | POA: Diagnosis not present

## 2024-01-28 ENCOUNTER — Other Ambulatory Visit: Payer: Self-pay | Admitting: Nurse Practitioner

## 2024-01-28 ENCOUNTER — Other Ambulatory Visit: Payer: Self-pay

## 2024-01-28 MED ORDER — TIZANIDINE HCL 4 MG PO TABS
4.0000 mg | ORAL_TABLET | Freq: Four times a day (QID) | ORAL | 0 refills | Status: DC | PRN
  Filled 2024-01-28: qty 30, 8d supply, fill #0

## 2024-01-29 ENCOUNTER — Other Ambulatory Visit: Payer: Self-pay

## 2024-01-30 ENCOUNTER — Other Ambulatory Visit: Payer: Self-pay

## 2024-02-03 ENCOUNTER — Encounter: Payer: Self-pay | Admitting: Nurse Practitioner

## 2024-02-03 ENCOUNTER — Other Ambulatory Visit: Payer: Self-pay

## 2024-02-03 ENCOUNTER — Ambulatory Visit (INDEPENDENT_AMBULATORY_CARE_PROVIDER_SITE_OTHER): Admitting: Nurse Practitioner

## 2024-02-03 ENCOUNTER — Ambulatory Visit: Payer: Self-pay

## 2024-02-03 VITALS — BP 138/78 | HR 78 | Temp 97.0°F | Wt 187.0 lb

## 2024-02-03 DIAGNOSIS — Z8739 Personal history of other diseases of the musculoskeletal system and connective tissue: Secondary | ICD-10-CM

## 2024-02-03 DIAGNOSIS — R600 Localized edema: Secondary | ICD-10-CM | POA: Diagnosis not present

## 2024-02-03 DIAGNOSIS — I10 Essential (primary) hypertension: Secondary | ICD-10-CM

## 2024-02-03 MED ORDER — ALLOPURINOL 100 MG PO TABS
100.0000 mg | ORAL_TABLET | Freq: Every day | ORAL | 2 refills | Status: DC
  Filled 2024-02-03: qty 30, 30d supply, fill #0

## 2024-02-03 NOTE — Progress Notes (Signed)
 Acute Office Visit  Subjective:     Patient ID: Kaylee [name], female    DOB: 1956-08-13, 68 y.o.   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  Patient presents with complaints of bilateral ankle swelling that started 3 days ago, never had ankle swelling before.  Stated that she took an over-the-counter water pill last night and her swelling has resolved.  Takes amlodipine  10 mg daily, losartan  50 mg daily for hypertension , she was on hydrochlorothiazide  in the past medication was discontinued due to hypokalemia that was before she started taking losartan  .currently denies shortness of breath, chest pain, leg pain.    Review of Systems  Constitutional:  Negative for appetite change, chills, fatigue and fever.  HENT:  Negative for congestion, postnasal drip, rhinorrhea and sneezing.   Respiratory:  Negative for cough, shortness of breath and wheezing.   Cardiovascular:  Positive for leg swelling. Negative for chest pain and palpitations.  Gastrointestinal:  Negative for abdominal pain, constipation, nausea and vomiting.  Genitourinary:  Negative for difficulty urinating, dysuria, flank pain and frequency.  Musculoskeletal:  Negative for arthralgias, back pain, joint swelling and myalgias.  Skin:  Negative for color change, pallor, rash and wound.  Neurological:  Negative for dizziness, facial asymmetry, weakness, numbness and headaches.  Psychiatric/Behavioral:  Negative for behavioral problems, confusion, self-injury and suicidal ideas.         Objective:    BP 138/78   Pulse  78   Temp (!) 97 F (36.1 C)   Wt 187 lb (84.8 kg)   SpO2 97%   BMI 36.52 kg/m    Physical Exam Vitals and nursing note reviewed.  Constitutional:      General: She is not in acute distress.    Appearance: Normal appearance. She is not ill-appearing, toxic-appearing or diaphoretic.  HENT:     Mouth/Throat:     Pharynx: No oropharyngeal exudate.   Eyes:     General: No scleral icterus.       Right eye: No discharge.        Left eye: No discharge.     Extraocular Movements: Extraocular movements intact.     Conjunctiva/sclera: Conjunctivae normal.    Cardiovascular:     Rate and Rhythm: Normal rate and regular rhythm.     Pulses: Normal pulses.     Heart sounds: Normal heart sounds. No murmur heard.    No friction rub. No gallop.  Pulmonary:     Effort: Pulmonary effort is normal. No respiratory distress.     Breath sounds: Normal breath sounds. No stridor. No wheezing, rhonchi or rales.  Chest:     Chest wall: No tenderness.  Abdominal:     General: There is no distension.     Palpations: Abdomen is soft.     Tenderness: There is no abdominal tenderness. There is no right CVA tenderness, left CVA tenderness or guarding.   Musculoskeletal:        General: No swelling, tenderness, deformity or signs of injury.     Right lower leg: No edema.  Left lower leg: No edema.   Skin:    General: Skin is warm and dry.     Capillary Refill: Capillary refill takes less than 2 seconds.     Coloration: Skin is not jaundiced or pale.     Findings: No bruising, erythema or lesion.   Neurological:     Mental Status: She is alert and oriented to person, place, and time.     Motor: No weakness.     Gait: Gait normal.   Psychiatric:        Mood and Affect: Mood normal.        Behavior: Behavior normal.        Thought Content: Thought content normal.        Judgment: Judgment normal.     No results found for any visits on 02/03/24.      Assessment & Plan:   Problem  List Items Addressed This Visit       Cardiovascular and Mediastinum   Essential hypertension   Controlled on amlodipine  10 mg daily, losartan  50 mg daily DASH diet and commitment to daily physical activity for a minimum of 30 minutes discussed and encouraged, as a part of hypertension management.      02/03/2024    3:43 PM 02/03/2024    3:37 PM 01/01/2024    8:23 AM 12/30/2023   12:10 PM 12/16/2023    3:05 PM 12/16/2023    2:29 PM 12/09/2023    1:15 PM  BP/Weight  Systolic BP 138 138 125 130 142 150 136  Diastolic BP 78 81 81 60 94 100 83  Wt. (Lbs)  187 187 187.2  188.8 189.8  BMI  36.52 kg/m2 36.52 kg/m2 36.56 kg/m2  36.87 kg/m2 37.07 kg/m2             Other   Bilateral lower extremity edema - Primary   No edema noted today Encouraged DASH diet, elevation, use of compression socks Encouraged patient to call the office if edema returns ,could consider decreasing amlodipine  to 5 mg daily and restarting hydrochlorothiazide  12.5 mg daily since the patient is now on losartan  50 mg daily Avoid taking medications not prescribed by a doctor        Other Visit Diagnoses       History of gout       Relevant Medications   allopurinol  (ZYLOPRIM ) 100 MG tablet       Meds ordered this encounter  Medications   allopurinol  (ZYLOPRIM ) 100 MG tablet    Sig: Take 1 tablet (100 mg total) by mouth daily.    Dispense:  30 tablet    Refill:  2    No follow-ups on file.  Safal Halderman R Cameran Ahmed, FNP

## 2024-02-03 NOTE — Assessment & Plan Note (Signed)
 No edema noted today Encouraged DASH diet, elevation, use of compression socks Encouraged patient to call the office if edema returns ,could consider decreasing amlodipine  to 5 mg daily and restarting hydrochlorothiazide  12.5 mg daily since the patient is now on losartan  50 mg daily Avoid taking medications not prescribed by a doctor

## 2024-02-03 NOTE — Assessment & Plan Note (Signed)
 Controlled on amlodipine  10 mg daily, losartan  50 mg daily DASH diet and commitment to daily physical activity for a minimum of 30 minutes discussed and encouraged, as a part of hypertension management.      02/03/2024    3:43 PM 02/03/2024    3:37 PM 01/01/2024    8:23 AM 12/30/2023   12:10 PM 12/16/2023    3:05 PM 12/16/2023    2:29 PM 12/09/2023    1:15 PM  BP/Weight  Systolic BP 138 138 125 130 142 150 136  Diastolic BP 78 81 81 60 94 100 83  Wt. (Lbs)  187 187 187.2  188.8 189.8  BMI  36.52 kg/m2 36.52 kg/m2 36.56 kg/m2  36.87 kg/m2 37.07 kg/m2

## 2024-02-03 NOTE — Telephone Encounter (Signed)
 FYI Only or Action Required?: FYI only for provider  Patient was last seen in primary care on 12/09/2023 by Jerrlyn Morel, NP. Called Nurse Triage reporting Joint Swelling. Symptoms began several days ago. Interventions attempted: Nothing. Symptoms are: gradually worsening.  Triage Disposition: See PCP When Office is Open (Within 3 Days)  Patient/caregiver understands and will follow disposition?: Yes, will follow disposition  Copied from CRM [id number]. Topic: Clinical - Red Word Triage >> Feb 03, 2024 10:47 AM Baldemar Lev wrote: Red Word that prompted transfer to Nurse Triage: Swelling in ankles Reason for Disposition  MILD or MODERATE ankle swelling (e.g., can't move joint normally, can't do usual activities) (Exceptions: Itchy, localized swelling; swelling is chronic.)  Answer Assessment - Initial Assessment Questions 1. LOCATION: Which ankle is swollen? Where is the swelling?     Both ankles 2. ONSET: When did the swelling start?     About a few days 3. SWELLING: How bad is the swelling? Or, How large is it? (e.g., mild, moderate, severe; size of localized swelling)    - NONE: No joint swelling.   - LOCALIZED: Localized; small area of puffy or swollen skin (e.g., insect bite, skin irritation).   - MILD: Joint looks or feels mildly swollen or puffy.   - MODERATE: Swollen; interferes with normal activities (e.g., work or school); decreased range of movement; may be limping.   - SEVERE: Very swollen; can't move swollen joint at all; limping a lot or unable to walk.     mild 4. PAIN: Is there any pain? If Yes, ask: How bad is it? (Scale 1-10; or mild, moderate, severe)   - NONE (0): no pain.   - MILD (1-3): doesn't interfere with normal activities.    - MODERATE (4-7): interferes with normal activities (e.g., work or school) or awakens from sleep, limping.    - SEVERE (8-10): excruciating pain, unable to do any normal activities, unable to walk.      Denies pain 5.  CAUSE: What do you think caused the ankle swelling?     unsure 6. OTHER SYMPTOMS: Do you have any other symptoms? (e.g., fever, chest pain, difficulty breathing, calf pain)     denies  Protocols used: Ankle Swelling-A-AH

## 2024-02-03 NOTE — Patient Instructions (Signed)
 For the swelling in your lower extremities, be sure to elevate your legs when able, mind the salt intake, stay physically active and consider wearing compression stockings.    It is important that you exercise regularly at least 30 minutes 5 times a week as tolerated  Think about what you will eat, plan ahead. Choose " clean, green, fresh or frozen" over canned, processed or packaged foods which are more sugary, salty and fatty. 70 to 75% of food eaten should be vegetables and fruit. Three meals at set times with snacks allowed between meals, but they must be fruit or vegetables. Aim to eat over a 12 hour period , example 7 am to 7 pm, and STOP after  your last meal of the day. Drink water ,generally about 64 ounces per day, no other drink is as healthy. Fruit juice is best enjoyed in a healthy way, by EATING the fruit.  Thanks for choosing [facility] we consider it a privelige to serve you.

## 2024-02-03 NOTE — Telephone Encounter (Signed)
 Please advise [facility]

## 2024-02-04 NOTE — Telephone Encounter (Signed)
 Pt was seen yesterday by Enid Harry. KH

## 2024-02-06 ENCOUNTER — Other Ambulatory Visit: Payer: Self-pay

## 2024-02-13 ENCOUNTER — Other Ambulatory Visit (HOSPITAL_COMMUNITY): Payer: Self-pay

## 2024-02-17 ENCOUNTER — Encounter: Payer: Self-pay | Admitting: Cardiology

## 2024-02-17 ENCOUNTER — Telehealth (HOSPITAL_BASED_OUTPATIENT_CLINIC_OR_DEPARTMENT_OTHER): Payer: Self-pay | Admitting: Licensed Clinical Social Worker

## 2024-02-17 ENCOUNTER — Ambulatory Visit: Attending: Cardiology | Admitting: Cardiology

## 2024-02-17 VITALS — BP 110/80 | HR 79 | Ht 60.0 in | Wt 187.6 lb

## 2024-02-17 DIAGNOSIS — E782 Mixed hyperlipidemia: Secondary | ICD-10-CM

## 2024-02-17 DIAGNOSIS — I300 Acute nonspecific idiopathic pericarditis: Secondary | ICD-10-CM | POA: Diagnosis not present

## 2024-02-17 DIAGNOSIS — I10 Essential (primary) hypertension: Secondary | ICD-10-CM

## 2024-02-17 DIAGNOSIS — R0683 Snoring: Secondary | ICD-10-CM

## 2024-02-17 NOTE — Patient Instructions (Signed)
 Medication Instructions:  No medication changes were made during today's visit.  *If you need a refill on your cardiac medications before your next appointment, please call your pharmacy*   Lab Work: No labs were ordered during today's visit.  If you have labs (blood work) drawn today and your tests are completely normal, you will receive your results only by: MyChart Message (if you have MyChart) OR A paper copy in the mail If you have any lab test that is abnormal or we need to change your treatment, we will call you to review the results.   Testing/Procedures: Your physician has recommended that you have a sleep study. This test records several body functions during sleep, including: brain activity, eye movement, oxygen and carbon dioxide blood levels, heart rate and rhythm, breathing rate and rhythm, the flow of air through your mouth and nose, snoring, body muscle movements, and chest and belly movement.    Follow-Up: At [facility], you and your health needs are our priority.  As part of our continuing mission to provide you with exceptional heart care, we have created designated Provider Care Teams.  These Care Teams include your primary Cardiologist (physician) and Advanced Practice Providers (APPs -  Physician Assistants and Nurse Practitioners) who all work together to provide you with the care you need, when you need it.  We recommend signing up for the patient portal called MyChart.  Sign up information is provided on this After Visit Summary.  MyChart is used to connect with patients for Virtual Visits (Telemedicine).  Patients are able to view lab/test results, encounter notes, upcoming appointments, etc.  Non-urgent messages can be sent to your provider as well.   To learn more about what you can do with MyChart, go to [email or web address].    Your next appointment:   1 year(s)  Provider:   Oneil Parchment, MD    Other Instructions Thank you for choosing [facility]!

## 2024-02-17 NOTE — Telephone Encounter (Signed)
 H&V Care Navigation CSW Progress Note  Clinical Social Worker received request from Anitra, [place] regarding cuff assistance. Discussed that compliance requires pt to first contact their plan [facility]) regarding any resources their plan has for cuff. If no benefits and pt unable to afford cost at [facility] for cuff requested she contact office back and we could provide her with assistance. Per message Charlena will let pt know. Remain available as needed.   Patient is participating in a Managed Medicaid Plan:  No, UHC  Medicare  SDOH Screenings   Food Insecurity: No Food Insecurity (11/27/2023)  Housing: Low Risk  (11/27/2023)  Transportation Needs: No Transportation Needs (11/27/2023)  Utilities: Not At Risk (11/27/2023)  Alcohol Screen: Medium Risk (04/04/2023)  Depression (PHQ2-9): Low Risk  (02/03/2024)  Financial Resource Strain: Low Risk  (04/04/2023)  Physical Activity: Insufficiently Active (04/04/2023)  Social Connections: Moderately Integrated (11/27/2023)  Stress: No Stress Concern Present (04/04/2023)  Tobacco Use: Medium Risk (02/17/2024)  Health Literacy: Patient Declined (04/04/2023)    Marit Lark, MSW, LCSW Clinical Social Worker II [facility] Care Navigation  [phone]- work cell phone (preferred)

## 2024-02-17 NOTE — Progress Notes (Signed)
 Cardiology Office Note:   Date:  02/17/2024  ID:  Kaylee [name], Kaylee [name] 1955-12-04, MRN [id number] PCP: Oley Bascom RAMAN, NP  [facility] Providers Cardiologist:  Debby Sor, MD    History of Present Illness:   Discussed the use of AI scribe software for clinical note transcription with the patient, who gave verbal consent to proceed.  History of Present Illness Kaylee [name] is a 68 y.o. female with history of hypertension, hyperlipidemia, tobacco use, Bell's palsy, pericarditis.   She was consulted on by cardiology in April of this year when admitted with chest pain. She had presented to the ER with chest pain, back pain, neck pain. Hstn unremarkable. EKG with inferior and lateral T wave inversions not seen on historical EKGs. Echo and inflammatory labs ordered. Pain continued during admission, was reproducible with deep inspiration and chest wall palpation. Labs consistent with pericarditis and patient started on Colchicine . Patient's hydrochlorothiazide  stopped during admission due to hypokalemia and hypotension.    Her blood pressure has improved significantly since starting losartan . She is currently taking 50 mg of losartan  and 10 mg of amlodipine  daily. She has not been checking her blood pressure at home due to financial constraints preventing cuff purchase.  She has a history of pericarditis and has been on colchicine  for three months, which she is about to complete. She reports improvement in symptoms, including no longer experiencing sharp chest pains or breathing difficulties.   She was previously prescribed pantoprazole  during a hospital admission for acid reflux but has not experienced any chest discomfort or reflux symptoms recently.  She experiences occasional swelling in her legs, which she attributes to prolonged standing and walking. The swelling is not present today.  She reports sleep disturbances, sleeping only two hours at a time and then waking for three to  four hours. She is open to considering a sleep study to evaluate for sleep apnea, which she discussed in a previous visit.  She is currently taking 5 mg of Crestor  for cholesterol management, with her last cholesterol level being 108 in January. She has an upcoming appointment with her PCP for routine lab checks.   Today patient denies chest pain, shortness of breath, lower extremity edema, fatigue, palpitations, melena, hematuria, hemoptysis, diaphoresis, weakness, presyncope, syncope, orthopnea, and PND.   Studies Reviewed:    EKG:         Risk Assessment/Calculations:         STOP-Bang Score:  5       Physical Exam:   VS:  BP 110/80   Pulse 79   Ht 5' (1.524 m)   Wt 187 lb 9.6 oz (85.1 kg)   SpO2 93%   BMI 36.64 kg/m    Wt Readings from Last 3 Encounters:  02/17/24 187 lb 9.6 oz (85.1 kg)  02/03/24 187 lb (84.8 kg)  01/01/24 187 lb (84.8 kg)     Physical Exam Vitals reviewed.  Constitutional:      Appearance: Normal appearance.  HENT:     Head: Normocephalic.     Nose: Nose normal.   Eyes:     Pupils: Pupils are equal, round, and reactive to light.    Cardiovascular:     Rate and Rhythm: Normal rate and regular rhythm.     Pulses: Normal pulses.     Heart sounds: Normal heart sounds. No murmur heard.    No friction rub. No gallop.  Pulmonary:     Effort: Pulmonary effort is normal.  Breath sounds: Normal breath sounds.  Abdominal:     General: Abdomen is flat.   Musculoskeletal:     Right lower leg: No edema.     Left lower leg: No edema.   Skin:    General: Skin is warm and dry.     Capillary Refill: Capillary refill takes less than 2 seconds.   Neurological:     General: No focal deficit present.     Mental Status: She is alert and oriented to person, place, and time.   Psychiatric:        Mood and Affect: Mood normal.        Behavior: Behavior normal.        Thought Content: Thought content normal.        Judgment: Judgment normal.      Physical Exam VITALS: BP- 110/80   ASSESSMENT AND PLAN:     Assessment and Plan Assessment & Plan Pericarditis Completed ibuprofen  therapy and nearing completion of colchicine  therapy. No current symptoms of chest pain or breathing difficulties.  - Complete colchicine  therapy in a few days.  Hypertension Blood pressure is well-controlled at 110/80 mmHg with current medication regimen of 10 mg amlodipine  and 50 mg losartan . No home blood pressure monitoring due to financial constraints, but advised to use pharmacy blood pressure cuffs for periodic checks. - Continue 10 mg amlodipine  daily. - Continue 50 mg losartan  daily. - Advise using pharmacy blood pressure cuffs for periodic monitoring. - Will inquire with social work about providing BP cuff  Sleep apnea (suspected) Reports disrupted sleep pattern, waking frequently at night. Discussed potential impact on hypertension and risk of congestive heart failure. Agreed to consider a sleep study if costs are manageable. - Investigate cost of sleep study. - Consider home sleep study if affordable.  Gastroesophageal reflux disease (GERD) Previously prescribed pantoprazole  during hospital admission, but no current symptoms of acid reflux or chest discomfort. Discussed that pantoprazole  may not be necessary and over-the-counter options like are available. - Consider over-the-counter antacids if needed.  Follow up in 1 year.        Signed, Artist Pouch, PA-C

## 2024-02-24 ENCOUNTER — Other Ambulatory Visit: Payer: Self-pay | Admitting: Nurse Practitioner

## 2024-02-24 ENCOUNTER — Other Ambulatory Visit: Payer: Self-pay

## 2024-02-24 MED ORDER — TIZANIDINE HCL 4 MG PO TABS
4.0000 mg | ORAL_TABLET | Freq: Four times a day (QID) | ORAL | 0 refills | Status: DC | PRN
Start: 2024-02-24 — End: 2024-03-11
  Filled 2024-02-24: qty 30, 8d supply, fill #0

## 2024-02-25 ENCOUNTER — Other Ambulatory Visit: Payer: Self-pay

## 2024-03-11 ENCOUNTER — Ambulatory Visit (INDEPENDENT_AMBULATORY_CARE_PROVIDER_SITE_OTHER): Payer: Self-pay | Admitting: Nurse Practitioner

## 2024-03-11 ENCOUNTER — Other Ambulatory Visit: Payer: Self-pay

## 2024-03-11 ENCOUNTER — Encounter: Payer: Self-pay | Admitting: Nurse Practitioner

## 2024-03-11 DIAGNOSIS — Z8739 Personal history of other diseases of the musculoskeletal system and connective tissue: Secondary | ICD-10-CM | POA: Diagnosis not present

## 2024-03-11 MED ORDER — COLCHICINE 0.6 MG PO TABS
0.6000 mg | ORAL_TABLET | Freq: Two times a day (BID) | ORAL | 2 refills | Status: DC
  Filled 2024-03-11: qty 60, 30d supply, fill #0
  Filled 2024-05-18: qty 60, 30d supply, fill #1
  Filled 2024-07-02: qty 60, 30d supply, fill #2

## 2024-03-11 MED ORDER — TIZANIDINE HCL 4 MG PO TABS
4.0000 mg | ORAL_TABLET | Freq: Four times a day (QID) | ORAL | 3 refills | Status: AC | PRN
  Filled 2024-03-11: qty 60, 15d supply, fill #0
  Filled 2024-06-02: qty 60, 15d supply, fill #1
  Filled 2024-07-27: qty 60, 15d supply, fill #2

## 2024-03-11 MED ORDER — ALLOPURINOL 100 MG PO TABS
100.0000 mg | ORAL_TABLET | Freq: Every day | ORAL | 2 refills | Status: DC
Start: 2024-03-11 — End: 2024-07-02
  Filled 2024-03-11: qty 30, 30d supply, fill #0
  Filled 2024-04-20: qty 30, 30d supply, fill #1
  Filled 2024-05-18: qty 30, 30d supply, fill #2

## 2024-03-11 NOTE — Progress Notes (Signed)
 Subjective   Patient ID: Kaylee [name], female    DOB: September 30, 1955, 68 y.o.   MRN: [id number]  Chief Complaint  Patient presents with   Medical Management of Chronic Issues    Referring provider: Oley Bascom RAMAN, NP  [name] is a 68 y.o. 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 She is not exercising and is adherent to low salt diet.  Blood pressure is not well controlled at home. Cardiac symptoms none. Patient denies chest pain, claudication, dyspnea, and fatigue.  Cardiovascular risk factors: hypertension and obesity (BMI >= 30 kg/m2). Use of agents associated with hypertension: none. History of target organ damage: none. States that she is compliant with all medications. Last dose of medication yesterday. Did not take today. Denies f/c/s, n/v/d, hemoptysis, PND, leg swelling. Denies chest pain or edema.    Allergies  Allergen Reactions   Codeine Nausea And Vomiting   Gabapentin  (Once-Daily) Hives   Tomato Itching    Immunization History  Administered Date(s) Administered   PFIZER(Purple Top)SARS-COV-2 Vaccination 08/18/2020, 09/08/2020   Tdap 12/29/2012    Tobacco History: Social History   Tobacco Use  Smoking Status Former   Current packs/day: 0.00   Types: Cigarettes   Quit date: 07/20/1982   Years since quitting: 41.6   Passive exposure: Never  Smokeless Tobacco Never   Counseling  given: Not Answered   Outpatient Encounter Medications as of 03/11/2024  Medication Sig   albuterol  (VENTOLIN  HFA) 108 (90 Base) MCG/ACT inhaler Inhale 2 puffs into the lungs every 6 (six) hours as needed for wheezing or shortness of breath.   alendronate  (FOSAMAX ) 70 MG tablet Take 1 tablet (70 mg total) by mouth every 7 (seven) days. Take with a full glass of water on an empty stomach.   amLODipine  (NORVASC ) 10 MG tablet Take 1 tablet (10 mg total) by mouth daily.   calcium  carbonate (TUMS - DOSED IN MG ELEMENTAL CALCIUM ) 500 MG chewable tablet Chew 1-6 tablets by mouth as needed for indigestion or heartburn.   colchicine  0.6 MG tablet Take 1 tablet (0.6 mg total) by mouth 2 (two) times daily.   fexofenadine  (ALLEGRA  ALLERGY) 180 MG tablet Take 1 tablet (180 mg total) by mouth daily.   Fexofenadine  HCl (ALLEGRA  PO) Take 1 tablet by mouth as needed.   losartan  (COZAAR ) 50 MG tablet Take 1 tablet (50 mg total) by mouth daily.   pantoprazole  (PROTONIX ) 40 MG tablet Take 1 tablet (40 mg total) by mouth daily.   rosuvastatin  (CRESTOR ) 5 MG tablet Take 5 mg by mouth at bedtime.   [DISCONTINUED] allopurinol  (ZYLOPRIM ) 100 MG tablet Take 1 tablet (100 mg total) by mouth daily.   [DISCONTINUED] tiZANidine  (ZANAFLEX ) 4 MG tablet Take 1 tablet (4 mg total) by mouth every 6 (six) hours as needed for muscle spasms.   allopurinol  (  ZYLOPRIM ) 100 MG tablet Take 1 tablet (100 mg total) by mouth daily.   tiZANidine  (ZANAFLEX ) 4 MG tablet Take 1 tablet (4 mg total) by mouth every 6 (six) hours as needed for muscle spasms.   [DISCONTINUED] cetirizine  (ZYRTEC ) 10 MG tablet Take 1 tablet (10 mg total) by mouth daily. (Patient not taking: Reported on 04/09/2019)   No facility-administered encounter medications on file as of 03/11/2024.    Review of Systems  Review of Systems  Constitutional: Negative.   HENT: Negative.    Cardiovascular: Negative.   Gastrointestinal: Negative.   Allergic/Immunologic:  Negative.   Neurological: Negative.   Psychiatric/Behavioral: Negative.       Objective:   BP 126/80   Pulse 86   Temp 98.1 F (36.7 C) (Oral)   Wt 186 lb 14.4 oz (84.8 kg)   SpO2 100%   BMI 36.50 kg/m   Wt Readings from Last 5 Encounters:  03/11/24 186 lb 14.4 oz (84.8 kg)  02/17/24 187 lb 9.6 oz (85.1 kg)  02/03/24 187 lb (84.8 kg)  01/01/24 187 lb (84.8 kg)  12/30/23 187 lb 3.2 oz (84.9 kg)     Physical Exam Vitals and nursing note reviewed.  Constitutional:      General: She is not in acute distress.    Appearance: She is well-developed.  Cardiovascular:     Rate and Rhythm: Normal rate and regular rhythm.  Pulmonary:     Effort: Pulmonary effort is normal.     Breath sounds: Normal breath sounds.  Neurological:     Mental Status: She is alert and oriented to person, place, and time.       Assessment & Plan:   History of gout -     Allopurinol ; Take 1 tablet (100 mg total) by mouth daily.  Dispense: 30 tablet; Refill: 2  Other orders -     tiZANidine  HCl; Take 1 tablet (4 mg total) by mouth every 6 (six) hours as needed for muscle spasms.  Dispense: 60 tablet; Refill: 3 -     Colchicine ; Take 1 tablet (0.6 mg total) by mouth 2 (two) times daily.  Dispense: 60 tablet; Refill: 2     Return in about 6 months (around 09/11/2024).   Bascom GORMAN Borer, NP 03/11/2024

## 2024-03-12 ENCOUNTER — Other Ambulatory Visit: Payer: Self-pay

## 2024-03-17 ENCOUNTER — Encounter

## 2024-03-17 ENCOUNTER — Ambulatory Visit: Admitting: Pulmonary Disease

## 2024-03-22 ENCOUNTER — Ambulatory Visit: Payer: Self-pay | Admitting: Pulmonary Disease

## 2024-03-31 ENCOUNTER — Other Ambulatory Visit: Payer: Self-pay

## 2024-04-03 ENCOUNTER — Other Ambulatory Visit: Payer: Self-pay

## 2024-04-09 ENCOUNTER — Ambulatory Visit: Payer: Self-pay

## 2024-04-09 VITALS — Ht 60.0 in | Wt 187.0 lb

## 2024-04-09 DIAGNOSIS — Z0000 Encounter for general adult medical examination without abnormal findings: Secondary | ICD-10-CM
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
## 2024-04-22 ENCOUNTER — Other Ambulatory Visit: Payer: Self-pay

## 2024-05-12 ENCOUNTER — Telehealth: Payer: Self-pay

## 2024-05-12 NOTE — Telephone Encounter (Signed)
 Ordering provider: Artist Pouch  Associated diagnoses:  R06.83 (Snoring) WatchPAT PA obtained on 05/12/2024 by Dena JAYSON Hesselbach, CMA. Authorization: No Prior Authorization is required per [facility] provider portal. Patient notified of PIN ([phone]) on 05/12/2024 via Notification Method: phone.  Phone note routed to covering staff for follow-up.

## 2024-05-13 NOTE — Telephone Encounter (Signed)
**Note De-Identified Osiris Charles Obfuscation** Patient agreement reviewed and signed on 05/13/2024.  WatchPAT issued to patient on 05/13/2024 by Bard Haupert, Avelina HERO, LPN. Patient aware to not open the WatchPAT box until contacted with the activation PIN. Patient profile initialized in CloudPAT on 05/13/2024 by Macario Louis Ivery, LPN. Device serial number: [id number]

## 2024-05-20 ENCOUNTER — Ambulatory Visit (INDEPENDENT_AMBULATORY_CARE_PROVIDER_SITE_OTHER): Admitting: *Deleted

## 2024-05-20 ENCOUNTER — Ambulatory Visit: Admitting: Pulmonary Disease

## 2024-05-20 ENCOUNTER — Encounter: Payer: Self-pay | Admitting: Pulmonary Disease

## 2024-05-20 ENCOUNTER — Other Ambulatory Visit: Payer: Self-pay

## 2024-05-20 VITALS — BP 146/83 | HR 81 | Ht 61.5 in | Wt 188.0 lb

## 2024-05-20 DIAGNOSIS — R0602 Shortness of breath: Secondary | ICD-10-CM | POA: Diagnosis not present

## 2024-05-20 DIAGNOSIS — R918 Other nonspecific abnormal finding of lung field: Secondary | ICD-10-CM

## 2024-05-20 LAB — PULMONARY FUNCTION TEST
DL/VA % pred: 105 %
DL/VA: 4.46 ml/min/mmHg/L
DLCO cor % pred: 97 %
DLCO cor: 17.67 ml/min/mmHg
DLCO unc % pred: 97 %
DLCO unc: 17.67 ml/min/mmHg
FEF 25-75 Post: 2.25 L/s
FEF 25-75 Pre: 2.48 L/s
FEF2575-%Change-Post: -8 %
FEF2575-%Pred-Post: 118 %
FEF2575-%Pred-Pre: 130 %
FEV1-%Change-Post: 4 %
FEV1-%Pred-Post: 92 %
FEV1-%Pred-Pre: 89 %
FEV1-Post: 1.98 L
FEV1-Pre: 1.89 L
FEV1FVC-%Change-Post: -14 %
FEV1FVC-%Pred-Pre: 113 %
FEV6-%Change-Post: 20 %
FEV6-%Pred-Post: 98 %
FEV6-%Pred-Pre: 81 %
FEV6-Post: 2.63 L
FEV6-Pre: 2.17 L
FEV6FVC-%Change-Post: 0 %
FEV6FVC-%Pred-Post: 104 %
FEV6FVC-%Pred-Pre: 103 %
FVC-%Change-Post: 22 %
FVC-%Pred-Post: 95 %
FVC-%Pred-Pre: 78 %
FVC-Post: 2.67 L
FVC-Pre: 2.18 L
Post FEV1/FVC ratio: 74 %
Post FEV6/FVC ratio: 100 %
Pre FEV1/FVC ratio: 87 %
Pre FEV6/FVC Ratio: 100 %
RV % pred: 92 %
RV: 1.85 L
TLC % pred: 97 %
TLC: 4.58 L

## 2024-05-20 NOTE — Progress Notes (Signed)
 Synopsis: Referred in May 2025 for Abdnormal CT Chest findings  Subjective:   PATIENT ID: Kaylee [name] GENDER: female DOB: [birth year], MRN: [id number]  HPI  Chief Complaint  Patient presents with   Medical Management of Chronic Issues   Kaylee [name] is a 68 year old woman, former smoker with hypertension who returns to pulmonary clinic for interstitial lung abnormality.   Initial OV 01/01/24 She was admitted 4/9 to 4/11 for chest pain due to pericarditis. She was started on colchicine  by Cardiology. CTA Chest showed extensive bibasilar scarring atelectasis and subacute airspace disease.   She is feeling much better since the hospital. She reports intermittent exertional dyspnea when climbing stairs or taking out the trash. Denies issues with cough or wheezing.   She quit smoking 39 years ago. She is retired and worked in Water engineer. Denies history of dust or chemical exposures. No pets at home. No water damage in her apartment.   She is accompanied by her sister.   OV 05/20/24 She experiences difficulty with breathing today, with generally stable breathing otherwise. She has not used her inhaler for about three days due to running out of medication.  She has sleep disturbances, waking after a few hours and having difficulty returning to sleep. She attempted a sleep apnea test but faced issues with device connectivity. Testing ordered by her cardiology team.   She denies diffuse joint pains. No dry eyes or dry mouth.  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    some locking of jaw, and h/o cerumen impaction   Family history of breast cancer    Daughter is being treated for breast cancer   Hyperlipidemia    Hypertension age 33   Seasonal allergies      Family History  Problem  Relation Age of Onset   Hypertension Mother    Alcohol abuse Mother    Alcohol abuse Father    Colon polyps Sister    Colon polyps Sister    Alcohol abuse Brother    Heart disease Brother 47   Anxiety disorder Daughter        panic attacks   Breast cancer Daughter    Cervical cancer Daughter    Heart failure Son    Stroke Neg Hx    Colon cancer Neg Hx    Cancer Neg Hx    Diabetes Neg Hx    Crohn's disease Neg Hx    Rectal cancer Neg Hx    Stomach cancer Neg Hx      Social History   Socioeconomic History   Marital status: Single    Spouse name: Not on file   Number of children: 4   Years of education: 11   Highest education level: Not on file  Occupational History   Not on file  Tobacco Use   Smoking status: Former    Current packs/day: 0.00    Types: Cigarettes    Quit date: 07/20/1982    Years since quitting: 41.8    Passive exposure: Never   Smokeless tobacco: Never  Vaping Use   Vaping status: Never Used  Substance and Sexual Activity   Alcohol use: Yes    Comment: socially   Drug use: Not Currently  Types: Marijuana    Comment: occ   Sexual activity: Not Currently    Partners: Male  Other Topics Concern   Not on file  Social History Narrative   Lives with youngest son.  Son in [place]  and 2 daughter are in [place].  No pets. Her daughter is currently being treated for breast cancer.   Caffeine- none   Social Drivers of Corporate investment banker Strain: Low Risk  (04/09/2024)   Overall Financial Resource Strain (CARDIA)    Difficulty of Paying Living Expenses: Not hard at all  Food Insecurity: No Food Insecurity (04/09/2024)   Hunger Vital Sign    Worried About Running Out of Food in the Last Year: Never true    Ran Out of Food in the Last Year: Never true  Transportation Needs: No Transportation Needs (04/09/2024)   PRAPARE - Administrator, Civil Service (Medical): No    Lack of Transportation (Non-Medical): No  Physical Activity:  Insufficiently Active (04/09/2024)   Exercise Vital Sign    Days of Exercise per Week: 7 days    Minutes of Exercise per Session: 20 min  Stress: No Stress Concern Present (04/09/2024)   [employer] of Occupational Health - Occupational Stress Questionnaire    Feeling of Stress: Not at all  Social Connections: Moderately Integrated (04/09/2024)   Social Connection and Isolation Panel    Frequency of Communication with Friends and Family: More than three times a week    Frequency of Social Gatherings with Friends and Family: More than three times a week    Attends Religious Services: More than 4 times per year    Active Member of [employer] or Organizations: Yes    Attends Banker Meetings: More than 4 times per year    Marital Status: Widowed  Intimate Partner Violence: Not At Risk (04/09/2024)   Humiliation, Afraid, Rape, and Kick questionnaire    Fear of Current or Ex-Partner: No    Emotionally Abused: No    Physically Abused: No    Sexually Abused: No     Allergies  Allergen Reactions   Codeine Nausea And Vomiting   Gabapentin  (Once-Daily) Hives   Tomato Itching     Outpatient Medications Prior to Visit  Medication Sig Dispense Refill   albuterol  (VENTOLIN  HFA) 108 (90 Base) MCG/ACT inhaler Inhale 2 puffs into the lungs every 6 (six) hours as needed for wheezing or shortness of breath. 18 g 6   alendronate  (FOSAMAX ) 70 MG tablet Take 1 tablet (70 mg total) by mouth every 7 (seven) days. Take with a full glass of water on an empty stomach. 4 tablet 11   allopurinol  (ZYLOPRIM ) 100 MG tablet Take 1 tablet (100 mg total) by mouth daily. 30 tablet 2   amLODipine  (NORVASC ) 10 MG tablet Take 1 tablet (10 mg total) by mouth daily. 90 tablet 3   calcium  carbonate (TUMS - DOSED IN MG ELEMENTAL CALCIUM ) 500 MG chewable tablet Chew 1-6 tablets by mouth as needed for indigestion or heartburn.     colchicine  0.6 MG tablet Take 1 tablet (0.6 mg total) by mouth 2 (two) times daily.  60 tablet 2   fexofenadine  (ALLEGRA  ALLERGY) 180 MG tablet Take 1 tablet (180 mg total) by mouth daily. 30 tablet 2   Fexofenadine  HCl (ALLEGRA  PO) Take 1 tablet by mouth as needed.     losartan  (COZAAR ) 50 MG tablet Take 1 tablet (50 mg total) by mouth daily. 90 tablet 1  pantoprazole  (PROTONIX ) 40 MG tablet Take 1 tablet (40 mg total) by mouth daily. 30 tablet 0   rosuvastatin  (CRESTOR ) 5 MG tablet Take 5 mg by mouth at bedtime.     tiZANidine  (ZANAFLEX ) 4 MG tablet Take 1 tablet (4 mg total) by mouth every 6 (six) hours as needed for muscle spasms. 60 tablet 3   No facility-administered medications prior to visit.   Review of Systems  Constitutional:  Negative for chills, fever, malaise/fatigue and weight loss.  HENT:  Negative for congestion, sinus pain and sore throat.   Eyes: Negative.   Respiratory:  Positive for shortness of breath. Negative for cough, hemoptysis, sputum production and wheezing.   Cardiovascular:  Negative for chest pain, palpitations, orthopnea, claudication and leg swelling.  Gastrointestinal:  Negative for abdominal pain, heartburn, nausea and vomiting.  Genitourinary: Negative.   Musculoskeletal:  Negative for joint pain and myalgias.  Skin:  Negative for rash.  Neurological:  Negative for weakness.  Endo/Heme/Allergies: Negative.   Psychiatric/Behavioral: Negative.      Objective:   Vitals:   05/20/24 1359  BP: (!) 146/83  Pulse: 81  SpO2: 97%  Weight: 188 lb (85.3 kg)  Height: 5' 1.5 (1.562 m)     Physical Exam Constitutional:      General: She is not in acute distress.    Appearance: Normal appearance. She is obese.  Eyes:     General: No scleral icterus.    Conjunctiva/sclera: Conjunctivae normal.  Cardiovascular:     Rate and Rhythm: Normal rate and regular rhythm.  Pulmonary:     Breath sounds: No wheezing, rhonchi or rales.  Musculoskeletal:     Right lower leg: No edema.     Left lower leg: No edema.  Skin:    General: Skin is  warm and dry.  Neurological:     General: No focal deficit present.       CBC    Component Value Date/Time   WBC 3.4 (L) 11/29/2023 0525   RBC 4.13 11/29/2023 0525   HGB 12.4 11/29/2023 0525   HGB 12.9 09/11/2023 1345   HCT 37.7 11/29/2023 0525   HCT 38.2 09/11/2023 1345   PLT 275 11/29/2023 0525   PLT 289 09/11/2023 1345   MCV 91.3 11/29/2023 0525   MCV 92 09/11/2023 1345   MCH 30.0 11/29/2023 0525   MCHC 32.9 11/29/2023 0525   RDW 13.0 11/29/2023 0525   RDW 12.7 09/11/2023 1345   LYMPHSABS 1.4 09/04/2021 1514   MONOABS 0.1 11/19/2016 1650   EOSABS 0.0 09/04/2021 1514   BASOSABS 0.0 09/04/2021 1514    Chest imaging: HRCT Chest 01/16/34 Mediastinum/Nodes: No pathologically enlarged mediastinal or axillary lymph nodes. Hilar regions are difficult to definitively evaluate without IV contrast. Esophagus is grossly unremarkable.   Lungs/Pleura: Peripheral and basilar predominant coarsened subpleural ground-glass with bronchiectasis. No subpleural reticulation, traction bronchiectasis/bronchiolectasis, architectural distortion or honeycombing. No pleural fluid. Airway is unremarkable. Minimal air trapping.  PFT:    Latest Ref Rng & Units 05/20/2024   12:47 PM  PFT Results  FVC-Pre L 2.18  P  FVC-Predicted Pre % 78  P  FVC-Post L 2.67  P  FVC-Predicted Post % 95  P  Pre FEV1/FVC % % 87  P  Post FEV1/FCV % % 74  P  FEV1-Pre L 1.89  P  FEV1-Predicted Pre % 89  P  FEV1-Post L 1.98  P  DLCO uncorrected ml/min/mmHg 17.67  P  DLCO UNC% % 97  P  DLCO  corrected ml/min/mmHg 17.67  P  DLCO COR %Predicted % 97  P  DLVA Predicted % 105  P  TLC L 4.58  P  TLC % Predicted % 97  P  RV % Predicted % 92  P    P Preliminary result    Labs:  Path:  Echo:  Heart Catheterization:       Assessment & Plan:   Interstitial lung abnormality (ILA) - Plan: CT CHEST HIGH RESOLUTION  Discussion: Kaylee [name] is a 68 year old woman, former smoker with hypertension who  is referred to pulmonary clinic for abnormal CT chest findings.   Interstitial Lung Abnormality - mild peripheral ground glass subpleural abnormalities - PFTs within normal limits - Continue albuterol  inhaler 1-2 puffs every 4-6 hours as needed - Plan to repeat CT Chest scan in May 2026 to monitor ILA changes  Pericarditis - management per cardiology   Hx of arthralgias - monitor for autoimmune features, appears to be osteoarthritis  Follow-up in May 2026  32 minutes spent on this visit   Dorn Chill, MD [facility] Office: [phone]    Current Outpatient Medications:    albuterol  (VENTOLIN  HFA) 108 (90 Base) MCG/ACT inhaler, Inhale 2 puffs into the lungs every 6 (six) hours as needed for wheezing or shortness of breath., Disp: 18 g, Rfl: 6   alendronate  (FOSAMAX ) 70 MG tablet, Take 1 tablet (70 mg total) by mouth every 7 (seven) days. Take with a full glass of water on an empty stomach., Disp: 4 tablet, Rfl: 11   allopurinol  (ZYLOPRIM ) 100 MG tablet, Take 1 tablet (100 mg total) by mouth daily., Disp: 30 tablet, Rfl: 2   amLODipine  (NORVASC ) 10 MG tablet, Take 1 tablet (10 mg total) by mouth daily., Disp: 90 tablet, Rfl: 3   calcium  carbonate (TUMS - DOSED IN MG ELEMENTAL CALCIUM ) 500 MG chewable tablet, Chew 1-6 tablets by mouth as needed for indigestion or heartburn., Disp: , Rfl:    colchicine  0.6 MG tablet, Take 1 tablet (0.6 mg total) by mouth 2 (two) times daily., Disp: 60 tablet, Rfl: 2   fexofenadine  (ALLEGRA  ALLERGY) 180 MG tablet, Take 1 tablet (180 mg total) by mouth daily., Disp: 30 tablet, Rfl: 2   Fexofenadine  HCl (ALLEGRA  PO), Take 1 tablet by mouth as needed., Disp: , Rfl:    losartan  (COZAAR ) 50 MG tablet, Take 1 tablet (50 mg total) by mouth daily., Disp: 90 tablet, Rfl: 1   pantoprazole  (PROTONIX ) 40 MG tablet, Take 1 tablet (40 mg total) by mouth daily., Disp: 30 tablet, Rfl: 0   rosuvastatin  (CRESTOR ) 5 MG tablet, Take 5 mg by  mouth at bedtime., Disp: , Rfl:    tiZANidine  (ZANAFLEX ) 4 MG tablet, Take 1 tablet (4 mg total) by mouth every 6 (six) hours as needed for muscle spasms., Disp: 60 tablet, Rfl: 3

## 2024-05-20 NOTE — Patient Instructions (Signed)
 Full PFT performed today.

## 2024-05-20 NOTE — Progress Notes (Signed)
 Full PFT performed today.

## 2024-05-20 NOTE — Patient Instructions (Addendum)
 Your breathing tests are within normal limits  Your CT chest scan shows mild non-specific inflammation in the outer parts of the lower lungs  Recommend repeating a CT Chest scan in 1 year in May 2026  Follow up in late May or early June 2026 to review CT Chest scan

## 2024-05-28 NOTE — Telephone Encounter (Signed)
 Contacted the patient to remind her to use the [employer] and patient states she hs been trying to use the machine every night but her pin code does not work. She states she has tried to use the 1234 pin, the last four phone numbers and the last four of social security number but nothing is working. She is requesting a call back for help.

## 2024-06-01 ENCOUNTER — Telehealth: Payer: Self-pay | Admitting: Cardiology

## 2024-06-01 NOTE — Telephone Encounter (Signed)
 Pt cannot complete at home sleep study because she does not have a smart phone. Please advise.

## 2024-06-01 NOTE — Telephone Encounter (Signed)
**Note De-Identified Mindy Gali Obfuscation** I checked [employer] portal and found that the PIN # was entered incorrectly.  I attempted to correct it but could not so I called the [employer] and s/w Raguel who advised me that the pt will need to use the PIN # that was entered which is [id number] and that the PIN # cannot be re-set.  I called the pt and explained all of the above and advised her to use PIN # [id number]. She states that she will and that she will call me back if she has problems with her device.  She thanked me for my assistance.

## 2024-06-01 NOTE — Telephone Encounter (Signed)
**Note De-Identified Anhelica Fowers Obfuscation** The pt states that she does not have a smart phone. I reminded her that she and I downloaded the Watch[employer] One-HST app on her cell phone while she was here getting her device.  She states that the [employer] advised her that her phone is not linking with the device and to call us .  I advised the pt that she must have a smart phone because we were able to download the app on her phone. The pt is aware that I am calling the [employer] for assistance and that I will call her back.  She verbalized understanding and thanked me for calling her back.

## 2024-06-03 ENCOUNTER — Other Ambulatory Visit: Payer: Self-pay

## 2024-06-30 ENCOUNTER — Other Ambulatory Visit: Payer: Self-pay

## 2024-06-30 ENCOUNTER — Other Ambulatory Visit: Payer: Self-pay | Admitting: Cardiology

## 2024-06-30 DIAGNOSIS — I10 Essential (primary) hypertension: Secondary | ICD-10-CM

## 2024-06-30 DIAGNOSIS — Z79899 Other long term (current) drug therapy: Secondary | ICD-10-CM

## 2024-06-30 MED ORDER — LOSARTAN POTASSIUM 50 MG PO TABS
50.0000 mg | ORAL_TABLET | Freq: Every day | ORAL | 1 refills | Status: DC
  Filled 2024-06-30: qty 90, 90d supply, fill #0
  Filled 2024-07-27 – 2024-07-29 (×2): qty 90, 90d supply, fill #1

## 2024-07-02 ENCOUNTER — Other Ambulatory Visit: Payer: Self-pay | Admitting: Nurse Practitioner

## 2024-07-02 ENCOUNTER — Other Ambulatory Visit: Payer: Self-pay

## 2024-07-02 DIAGNOSIS — Z8739 Personal history of other diseases of the musculoskeletal system and connective tissue: Secondary | ICD-10-CM

## 2024-07-02 MED ORDER — ALLOPURINOL 100 MG PO TABS
100.0000 mg | ORAL_TABLET | Freq: Every day | ORAL | 2 refills | Status: AC
  Filled 2024-07-02: qty 30, 30d supply, fill #0
  Filled 2024-08-21: qty 30, 30d supply, fill #1
  Filled 2024-09-22: qty 30, 30d supply, fill #2

## 2024-07-07 ENCOUNTER — Other Ambulatory Visit: Payer: Self-pay

## 2024-07-27 ENCOUNTER — Other Ambulatory Visit: Payer: Self-pay

## 2024-07-29 ENCOUNTER — Other Ambulatory Visit: Payer: Self-pay

## 2024-07-29 ENCOUNTER — Telehealth: Payer: Self-pay

## 2024-07-29 NOTE — Telephone Encounter (Signed)
 Copied from CRM [id number]. Topic: Clinical - Prescription Issue >> Jul 29, 2024 12:35 PM Avram MATSU wrote: Reason for CRM: patient stated she got a refill in November and had a found bottle dated back from august and now she does not need the losartan  (COZAAR ) 50 MG tablet [[id number]]  Please advise for further questions [phone]

## 2024-07-30 ENCOUNTER — Other Ambulatory Visit: Payer: Self-pay

## 2024-07-30 DIAGNOSIS — I10 Essential (primary) hypertension: Secondary | ICD-10-CM

## 2024-07-30 DIAGNOSIS — Z79899 Other long term (current) drug therapy: Secondary | ICD-10-CM

## 2024-07-30 MED FILL — Losartan Potassium Tab 50 MG: 50.0000 mg | ORAL | 90 days supply | Qty: 90 | Fill #0 | Status: CN

## 2024-07-30 NOTE — Telephone Encounter (Signed)
 Done [facility]

## 2024-08-24 ENCOUNTER — Other Ambulatory Visit: Payer: Self-pay

## 2024-09-07 ENCOUNTER — Other Ambulatory Visit: Payer: Self-pay

## 2024-09-07 ENCOUNTER — Other Ambulatory Visit: Payer: Self-pay | Admitting: Nurse Practitioner

## 2024-09-07 NOTE — Telephone Encounter (Signed)
     colchicine 0.6 MG tablet

## 2024-09-07 NOTE — Telephone Encounter (Signed)
 Please advise [facility]

## 2024-09-07 NOTE — Telephone Encounter (Signed)
Request already sent to pcp

## 2024-09-08 ENCOUNTER — Other Ambulatory Visit: Payer: Self-pay

## 2024-09-11 ENCOUNTER — Ambulatory Visit (INDEPENDENT_AMBULATORY_CARE_PROVIDER_SITE_OTHER): Payer: Self-pay | Admitting: Nurse Practitioner

## 2024-09-11 ENCOUNTER — Other Ambulatory Visit: Payer: Self-pay

## 2024-09-11 ENCOUNTER — Encounter: Payer: Self-pay | Admitting: Nurse Practitioner

## 2024-09-11 VITALS — BP 134/82 | HR 80 | Temp 97.9°F | Wt 194.2 lb

## 2024-09-11 DIAGNOSIS — Z131 Encounter for screening for diabetes mellitus: Secondary | ICD-10-CM

## 2024-09-11 DIAGNOSIS — I10 Essential (primary) hypertension: Secondary | ICD-10-CM

## 2024-09-11 DIAGNOSIS — Z13220 Encounter for screening for lipoid disorders: Secondary | ICD-10-CM | POA: Diagnosis not present

## 2024-09-11 DIAGNOSIS — Z1329 Encounter for screening for other suspected endocrine disorder: Secondary | ICD-10-CM

## 2024-09-11 MED ORDER — COLCHICINE 0.6 MG PO TABS
0.6000 mg | ORAL_TABLET | Freq: Two times a day (BID) | ORAL | 2 refills | Status: AC
  Filled 2024-09-11 (×2): qty 60, 30d supply, fill #0

## 2024-09-11 NOTE — Progress Notes (Signed)
 "  Subjective   Patient ID: Kaylee [name], female    DOB: [birth year], 69 y.o.   MRN: [id number]  Chief Complaint  Patient presents with   Hypertension    6 month f/u.     Referring provider: Oley Bascom RAMAN, NP  Kaylee [name] is a 69 y.o. 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 She is not exercising and is adherent to low salt diet.  Blood pressure is not well controlled at home. Cardiac symptoms none. Patient denies chest pain, claudication, dyspnea, and fatigue.  Cardiovascular risk factors: hypertension and obesity (BMI >= 30 kg/m2). Use of agents associated with hypertension: none. History of target organ damage: none. States that she is compliant with all medications. Last dose of medication yesterday. Did not take today. Denies f/c/s, n/v/d, hemoptysis, PND, leg swelling. Denies chest pain or edema.   Allergies[1]  Immunization History  Administered Date(s) Administered   PFIZER(Purple Top)SARS-COV-2 Vaccination 08/18/2020, 09/08/2020   Tdap 12/29/2012    Tobacco History: Tobacco Use History[2] Counseling given: Not Answered   Outpatient Encounter Medications as of 09/11/2024  Medication Sig   albuterol  (VENTOLIN  HFA) 108 (90 Base) MCG/ACT inhaler Inhale 2 puffs into the lungs every 6 (six) hours as needed for wheezing or shortness of breath.   alendronate  (FOSAMAX ) 70 MG tablet Take 1 tablet (70 mg  total) by mouth every 7 (seven) days. Take with a full glass of water on an empty stomach.   allopurinol  (ZYLOPRIM ) 100 MG tablet Take 1 tablet (100 mg total) by mouth daily.   amLODipine  (NORVASC ) 10 MG tablet Take 1 tablet (10 mg total) by mouth daily.   calcium  carbonate (TUMS - DOSED IN MG ELEMENTAL CALCIUM ) 500 MG chewable tablet Chew 1-6 tablets by mouth as needed for indigestion or heartburn.   fexofenadine  (ALLEGRA  ALLERGY) 180 MG tablet Take 1 tablet (180 mg total) by mouth daily.   Fexofenadine  HCl (ALLEGRA  PO) Take 1 tablet by mouth as needed.   losartan  (COZAAR ) 50 MG tablet Take 1 tablet (50 mg total) by mouth daily.   pantoprazole  (PROTONIX ) 40 MG tablet Take 1 tablet (40 mg total) by mouth daily.   rosuvastatin  (CRESTOR ) 5 MG tablet Take 5 mg by mouth at bedtime.   tiZANidine  (ZANAFLEX ) 4 MG tablet Take 1 tablet (4 mg total) by mouth every 6 (six) hours as needed for muscle spasms.   colchicine  0.6 MG tablet Take 1 tablet (0.6 mg total) by mouth 2 (two) times daily.   [DISCONTINUED] cetirizine  (ZYRTEC ) 10 MG tablet Take 1 tablet (10 mg total) by mouth daily. (Patient not taking: Reported on 04/09/2019)   [DISCONTINUED] colchicine  0.6 MG tablet Take 1 tablet (0.6 mg total) by mouth 2 (two) times daily. (Patient not taking: Reported on 09/11/2024)   No facility-administered encounter medications on file as of 09/11/2024.  Review of Systems  Review of Systems  Constitutional: Negative.   HENT: Negative.    Cardiovascular: Negative.   Gastrointestinal: Negative.   Allergic/Immunologic: Negative.   Neurological: Negative.   Psychiatric/Behavioral: Negative.       Objective:   BP 134/82   Pulse 80   Temp 97.9 F (36.6 C) (Temporal)   Wt 194 lb 3.2 oz (88.1 kg)   SpO2 97%   BMI 36.10 kg/m   Wt Readings from Last 5 Encounters:  09/11/24 194 lb 3.2 oz (88.1 kg)  05/20/24 188 lb (85.3 kg)  04/09/24 187 lb (84.8 kg)  03/11/24 186 lb 14.4 oz (84.8 kg)  02/17/24 187 lb  9.6 oz (85.1 kg)     Physical Exam Vitals and nursing note reviewed.  Constitutional:      General: She is not in acute distress.    Appearance: She is well-developed.  Cardiovascular:     Rate and Rhythm: Normal rate and regular rhythm.  Pulmonary:     Effort: Pulmonary effort is normal.     Breath sounds: Normal breath sounds.  Neurological:     Mental Status: She is alert and oriented to person, place, and time.       Assessment & Plan:   Essential hypertension -     CBC -     Comprehensive metabolic panel with GFR  Thyroid disorder screen  Lipid screening -     Lipid panel -     TSH  Diabetes mellitus screening -     Hemoglobin A1c  Other orders -     Colchicine ; Take 1 tablet (0.6 mg total) by mouth 2 (two) times daily.  Dispense: 60 tablet; Refill: 2     Return in about 6 months (around 03/11/2025).    Bascom GORMAN Borer, NP 09/11/2024     [1]  Allergies Allergen Reactions   Codeine Nausea And Vomiting   Gabapentin  (Once-Daily) Hives   Tomato Itching  [2]  Social History Tobacco Use  Smoking Status Former   Current packs/day: 0.00   Types: Cigarettes   Quit date: 07/20/1982   Years since quitting: 42.1   Passive exposure: Never  Smokeless Tobacco Never   "

## 2024-09-12 LAB — COMPREHENSIVE METABOLIC PANEL WITH GFR
ALT: 13 [IU]/L (ref 0–32)
AST: 22 [IU]/L (ref 0–40)
Albumin: 4.5 g/dL (ref 3.9–4.9)
Alkaline Phosphatase: 93 [IU]/L (ref 49–135)
BUN/Creatinine Ratio: 15 (ref 12–28)
BUN: 11 mg/dL (ref 8–27)
Bilirubin Total: 0.2 mg/dL (ref 0.0–1.2)
CO2: 21 mmol/L (ref 20–29)
Calcium: 9.8 mg/dL (ref 8.7–10.3)
Chloride: 105 mmol/L (ref 96–106)
Creatinine, Ser: 0.73 mg/dL (ref 0.57–1.00)
Globulin, Total: 2.8 g/dL (ref 1.5–4.5)
Glucose: 83 mg/dL (ref 70–99)
Potassium: 4.3 mmol/L (ref 3.5–5.2)
Sodium: 142 mmol/L (ref 134–144)
Total Protein: 7.3 g/dL (ref 6.0–8.5)
eGFR: 90 mL/min/{1.73_m2}

## 2024-09-12 LAB — CBC
Hematocrit: 39.6 % (ref 34.0–46.6)
Hemoglobin: 13.2 g/dL (ref 11.1–15.9)
MCH: 30.6 pg (ref 26.6–33.0)
MCHC: 33.3 g/dL (ref 31.5–35.7)
MCV: 92 fL (ref 79–97)
Platelets: 276 10*3/uL (ref 150–450)
RBC: 4.31 x10E6/uL (ref 3.77–5.28)
RDW: 12.6 % (ref 11.7–15.4)
WBC: 4.1 10*3/uL (ref 3.4–10.8)

## 2024-09-12 LAB — HEMOGLOBIN A1C
Est. average glucose Bld gHb Est-mCnc: 123 mg/dL
Hgb A1c MFr Bld: 5.9 % — ABNORMAL HIGH (ref 4.8–5.6)

## 2024-09-12 LAB — TSH: TSH: 2.64 u[IU]/mL (ref 0.450–4.500)

## 2024-09-12 LAB — LIPID PANEL
Chol/HDL Ratio: 2.7 ratio (ref 0.0–4.4)
Cholesterol, Total: 173 mg/dL (ref 100–199)
HDL: 63 mg/dL
LDL Chol Calc (NIH): 95 mg/dL (ref 0–99)
Triglycerides: 78 mg/dL (ref 0–149)
VLDL Cholesterol Cal: 15 mg/dL (ref 5–40)

## 2024-09-14 ENCOUNTER — Other Ambulatory Visit: Payer: Self-pay

## 2024-09-16 ENCOUNTER — Ambulatory Visit: Payer: Self-pay | Admitting: Nurse Practitioner

## 2024-09-16 ENCOUNTER — Other Ambulatory Visit: Payer: Self-pay

## 2025-03-11 ENCOUNTER — Ambulatory Visit: Payer: Self-pay | Admitting: Nurse Practitioner

## 2025-04-12 ENCOUNTER — Ambulatory Visit: Payer: Self-pay
